# Patient Record
Sex: Female | Born: 1935 | Race: White | Hispanic: No | Marital: Single | Smoking: Never smoker
Health system: Southern US, Community
[De-identification: ages and names within clinical notes are randomized; demographics above are authoritative.]

## PROBLEM LIST (undated history)

## (undated) DIAGNOSIS — F200 Paranoid schizophrenia: Secondary | ICD-10-CM

## (undated) DIAGNOSIS — I6529 Occlusion and stenosis of unspecified carotid artery: Secondary | ICD-10-CM

## (undated) DIAGNOSIS — I10 Essential (primary) hypertension: Secondary | ICD-10-CM

## (undated) DIAGNOSIS — C4492 Squamous cell carcinoma of skin, unspecified: Secondary | ICD-10-CM

## (undated) DIAGNOSIS — M810 Age-related osteoporosis without current pathological fracture: Secondary | ICD-10-CM

## (undated) DIAGNOSIS — E785 Hyperlipidemia, unspecified: Secondary | ICD-10-CM

## (undated) DIAGNOSIS — N189 Chronic kidney disease, unspecified: Secondary | ICD-10-CM

## (undated) HISTORY — PX: BREAST BIOPSY: SHX20

## (undated) HISTORY — DX: Chronic kidney disease, unspecified: N18.9

## (undated) HISTORY — DX: Hyperlipidemia, unspecified: E78.5

## (undated) HISTORY — DX: Squamous cell carcinoma of skin, unspecified: C44.92

## (undated) HISTORY — DX: Occlusion and stenosis of unspecified carotid artery: I65.29

## (undated) HISTORY — PX: CHOLECYSTECTOMY: SHX55

## (undated) HISTORY — DX: Age-related osteoporosis without current pathological fracture: M81.0

## (undated) HISTORY — DX: Essential (primary) hypertension: I10

## (undated) HISTORY — DX: Paranoid schizophrenia: F20.0

## (undated) HISTORY — PX: LASER ABLATION: SHX1947

## (undated) HISTORY — PX: CATARACT EXTRACTION: SUR2

## (undated) HISTORY — PX: PARTIAL HYSTERECTOMY: SHX80

## (undated) HISTORY — PX: TONSILLECTOMY: SUR1361

---

## 2004-07-22 ENCOUNTER — Ambulatory Visit: Payer: Self-pay | Admitting: Internal Medicine

## 2004-08-09 ENCOUNTER — Ambulatory Visit: Payer: Self-pay | Admitting: Internal Medicine

## 2004-09-06 ENCOUNTER — Ambulatory Visit: Payer: Self-pay | Admitting: Internal Medicine

## 2004-10-22 ENCOUNTER — Ambulatory Visit: Payer: Self-pay | Admitting: Family Medicine

## 2004-12-20 ENCOUNTER — Ambulatory Visit: Payer: Self-pay | Admitting: Internal Medicine

## 2004-12-29 ENCOUNTER — Ambulatory Visit: Payer: Self-pay | Admitting: Internal Medicine

## 2005-01-12 ENCOUNTER — Encounter: Admission: RE | Admit: 2005-01-12 | Discharge: 2005-01-12 | Payer: Self-pay | Admitting: Internal Medicine

## 2005-03-18 ENCOUNTER — Ambulatory Visit: Payer: Self-pay | Admitting: Internal Medicine

## 2005-03-21 ENCOUNTER — Ambulatory Visit: Payer: Self-pay | Admitting: Internal Medicine

## 2005-03-28 ENCOUNTER — Ambulatory Visit: Payer: Self-pay | Admitting: Internal Medicine

## 2005-04-15 ENCOUNTER — Ambulatory Visit: Payer: Self-pay | Admitting: Internal Medicine

## 2005-08-22 ENCOUNTER — Ambulatory Visit: Payer: Self-pay | Admitting: Internal Medicine

## 2005-09-07 ENCOUNTER — Ambulatory Visit: Payer: Self-pay | Admitting: Internal Medicine

## 2006-01-20 ENCOUNTER — Ambulatory Visit: Payer: Self-pay | Admitting: Internal Medicine

## 2006-03-03 ENCOUNTER — Ambulatory Visit: Payer: Self-pay | Admitting: Internal Medicine

## 2006-04-11 ENCOUNTER — Ambulatory Visit: Payer: Self-pay | Admitting: Internal Medicine

## 2006-04-20 ENCOUNTER — Ambulatory Visit: Payer: Self-pay | Admitting: Internal Medicine

## 2006-04-20 LAB — CONVERTED CEMR LAB
ALT: 19 units/L (ref 0–40)
AST: 25 units/L (ref 0–37)
CO2: 27 meq/L (ref 19–32)
Chloride: 105 meq/L (ref 96–112)
Chol/HDL Ratio, serum: 3.9
Cholesterol: 233 mg/dL (ref 0–200)
Glucose, Bld: 93 mg/dL (ref 70–99)
LDL DIRECT: 148.3 mg/dL
Potassium: 3.9 meq/L (ref 3.5–5.1)
Sodium: 140 meq/L (ref 135–145)

## 2006-05-11 ENCOUNTER — Encounter: Payer: Self-pay | Admitting: Family Medicine

## 2006-05-11 ENCOUNTER — Other Ambulatory Visit: Admission: RE | Admit: 2006-05-11 | Discharge: 2006-05-11 | Payer: Self-pay | Admitting: Family Medicine

## 2006-05-11 ENCOUNTER — Ambulatory Visit: Payer: Self-pay | Admitting: Family Medicine

## 2006-09-29 ENCOUNTER — Ambulatory Visit: Payer: Self-pay | Admitting: Family Medicine

## 2006-10-16 DIAGNOSIS — I10 Essential (primary) hypertension: Secondary | ICD-10-CM | POA: Insufficient documentation

## 2006-10-16 DIAGNOSIS — E785 Hyperlipidemia, unspecified: Secondary | ICD-10-CM

## 2006-10-16 DIAGNOSIS — Z8659 Personal history of other mental and behavioral disorders: Secondary | ICD-10-CM

## 2006-10-25 ENCOUNTER — Ambulatory Visit: Payer: Self-pay | Admitting: Internal Medicine

## 2006-11-30 ENCOUNTER — Emergency Department (HOSPITAL_COMMUNITY): Admission: EM | Admit: 2006-11-30 | Discharge: 2006-11-30 | Payer: Self-pay | Admitting: Emergency Medicine

## 2006-12-01 ENCOUNTER — Telehealth: Payer: Self-pay | Admitting: Internal Medicine

## 2006-12-01 ENCOUNTER — Ambulatory Visit: Payer: Self-pay | Admitting: Internal Medicine

## 2006-12-04 ENCOUNTER — Encounter: Payer: Self-pay | Admitting: Internal Medicine

## 2006-12-06 ENCOUNTER — Ambulatory Visit: Payer: Self-pay | Admitting: Internal Medicine

## 2006-12-07 LAB — CONVERTED CEMR LAB: TSH: 1.97 microintl units/mL (ref 0.35–5.50)

## 2007-01-12 ENCOUNTER — Ambulatory Visit: Payer: Self-pay | Admitting: Internal Medicine

## 2007-01-29 ENCOUNTER — Telehealth: Payer: Self-pay | Admitting: Internal Medicine

## 2007-02-13 ENCOUNTER — Other Ambulatory Visit: Payer: Self-pay | Admitting: Emergency Medicine

## 2007-02-13 ENCOUNTER — Ambulatory Visit: Payer: Self-pay | Admitting: Psychiatry

## 2007-02-13 ENCOUNTER — Inpatient Hospital Stay (HOSPITAL_COMMUNITY): Admission: AD | Admit: 2007-02-13 | Discharge: 2007-02-21 | Payer: Self-pay | Admitting: Psychiatry

## 2007-05-01 ENCOUNTER — Ambulatory Visit: Payer: Self-pay | Admitting: Internal Medicine

## 2007-08-02 ENCOUNTER — Ambulatory Visit: Payer: Self-pay | Admitting: Internal Medicine

## 2007-08-02 LAB — CONVERTED CEMR LAB
Glucose, Urine, Semiquant: NEGATIVE
Ketones, urine, test strip: NEGATIVE
Protein, U semiquant: NEGATIVE
RBC / HPF: NONE SEEN (ref <3)
Urobilinogen, UA: NEGATIVE

## 2007-08-07 ENCOUNTER — Telehealth (INDEPENDENT_AMBULATORY_CARE_PROVIDER_SITE_OTHER): Payer: Self-pay | Admitting: *Deleted

## 2007-08-07 LAB — CONVERTED CEMR LAB
BUN: 13 mg/dL (ref 6–23)
Basophils Relative: 0.7 % (ref 0.0–1.0)
Chloride: 105 meq/L (ref 96–112)
Cholesterol: 280 mg/dL (ref 0–200)
Creatinine, Ser: 0.9 mg/dL (ref 0.4–1.2)
Direct LDL: 185.5 mg/dL
Eosinophils Absolute: 0.3 10*3/uL (ref 0.0–0.6)
GFR calc Af Amer: 78 mL/min
HCT: 41.6 % (ref 36.0–46.0)
HDL: 64.5 mg/dL (ref >39.0)
Monocytes Absolute: 0.4 10*3/uL (ref 0.2–0.7)
Potassium: 3.9 meq/L (ref 3.5–5.1)
Sodium: 141 meq/L (ref 135–145)
Total CHOL/HDL Ratio: 4.3
WBC: 4.9 10*3/uL (ref 4.5–10.5)

## 2007-08-08 ENCOUNTER — Encounter (INDEPENDENT_AMBULATORY_CARE_PROVIDER_SITE_OTHER): Payer: Self-pay | Admitting: *Deleted

## 2007-08-08 ENCOUNTER — Telehealth (INDEPENDENT_AMBULATORY_CARE_PROVIDER_SITE_OTHER): Payer: Self-pay | Admitting: *Deleted

## 2007-08-10 ENCOUNTER — Encounter: Payer: Self-pay | Admitting: Internal Medicine

## 2007-08-16 ENCOUNTER — Ambulatory Visit: Payer: Self-pay | Admitting: Internal Medicine

## 2007-08-20 ENCOUNTER — Encounter: Admission: RE | Admit: 2007-08-20 | Discharge: 2007-08-20 | Payer: Self-pay | Admitting: Internal Medicine

## 2007-08-30 ENCOUNTER — Encounter (INDEPENDENT_AMBULATORY_CARE_PROVIDER_SITE_OTHER): Payer: Self-pay | Admitting: *Deleted

## 2007-08-31 ENCOUNTER — Encounter: Payer: Self-pay | Admitting: Internal Medicine

## 2007-09-18 ENCOUNTER — Ambulatory Visit: Payer: Self-pay | Admitting: Internal Medicine

## 2007-09-18 LAB — CONVERTED CEMR LAB
Bacteria, UA: NONE SEEN
Bilirubin Urine: NEGATIVE
Glucose, Urine, Semiquant: NEGATIVE
Ketones, urine, test strip: NEGATIVE
Nitrite: NEGATIVE
Protein, U semiquant: NEGATIVE
Urobilinogen, UA: 0.2

## 2007-09-19 ENCOUNTER — Encounter: Payer: Self-pay | Admitting: Internal Medicine

## 2007-09-20 ENCOUNTER — Telehealth (INDEPENDENT_AMBULATORY_CARE_PROVIDER_SITE_OTHER): Payer: Self-pay | Admitting: *Deleted

## 2007-09-21 ENCOUNTER — Ambulatory Visit: Payer: Self-pay | Admitting: Internal Medicine

## 2007-09-21 ENCOUNTER — Telehealth (INDEPENDENT_AMBULATORY_CARE_PROVIDER_SITE_OTHER): Payer: Self-pay | Admitting: *Deleted

## 2007-10-02 ENCOUNTER — Ambulatory Visit: Payer: Self-pay | Admitting: Internal Medicine

## 2007-10-03 ENCOUNTER — Telehealth (INDEPENDENT_AMBULATORY_CARE_PROVIDER_SITE_OTHER): Payer: Self-pay | Admitting: *Deleted

## 2007-10-25 ENCOUNTER — Ambulatory Visit: Payer: Self-pay | Admitting: Internal Medicine

## 2007-10-25 LAB — CONVERTED CEMR LAB
Blood in Urine, dipstick: NEGATIVE
Nitrite: NEGATIVE
Urobilinogen, UA: 0.2
WBC Urine, dipstick: NEGATIVE
pH: 5

## 2007-10-26 ENCOUNTER — Encounter: Payer: Self-pay | Admitting: Internal Medicine

## 2007-10-30 ENCOUNTER — Telehealth (INDEPENDENT_AMBULATORY_CARE_PROVIDER_SITE_OTHER): Payer: Self-pay | Admitting: *Deleted

## 2007-11-06 ENCOUNTER — Ambulatory Visit: Payer: Self-pay | Admitting: Internal Medicine

## 2007-11-07 ENCOUNTER — Encounter (INDEPENDENT_AMBULATORY_CARE_PROVIDER_SITE_OTHER): Payer: Self-pay | Admitting: *Deleted

## 2007-11-12 LAB — CONVERTED CEMR LAB
Cholesterol: 222 mg/dL (ref 0–200)
Direct LDL: 137.3 mg/dL
HDL: 52.7 mg/dL (ref >39.0)
Total CHOL/HDL Ratio: 4.2
Triglycerides: 168 mg/dL — ABNORMAL HIGH (ref 0–149)
VLDL: 34 mg/dL (ref 0–40)

## 2007-11-14 ENCOUNTER — Encounter: Payer: Self-pay | Admitting: Internal Medicine

## 2008-02-18 ENCOUNTER — Ambulatory Visit: Payer: Self-pay | Admitting: Internal Medicine

## 2008-02-18 LAB — CONVERTED CEMR LAB
Glucose, Urine, Semiquant: NEGATIVE
Ketones, urine, test strip: NEGATIVE
Protein, U semiquant: NEGATIVE
Specific Gravity, Urine: 1.015

## 2008-02-19 ENCOUNTER — Encounter: Payer: Self-pay | Admitting: Internal Medicine

## 2008-02-19 LAB — CONVERTED CEMR LAB

## 2008-03-04 ENCOUNTER — Ambulatory Visit: Payer: Self-pay | Admitting: Internal Medicine

## 2008-03-04 LAB — CONVERTED CEMR LAB
Glucose, Urine, Semiquant: NEGATIVE
Nitrite: NEGATIVE

## 2008-03-11 ENCOUNTER — Telehealth: Payer: Self-pay | Admitting: Internal Medicine

## 2008-03-14 ENCOUNTER — Encounter (INDEPENDENT_AMBULATORY_CARE_PROVIDER_SITE_OTHER): Payer: Self-pay | Admitting: *Deleted

## 2008-04-15 ENCOUNTER — Telehealth: Payer: Self-pay | Admitting: Internal Medicine

## 2008-05-15 ENCOUNTER — Telehealth: Payer: Self-pay | Admitting: Internal Medicine

## 2008-07-22 ENCOUNTER — Ambulatory Visit: Payer: Self-pay | Admitting: Internal Medicine

## 2008-07-23 ENCOUNTER — Telehealth: Payer: Self-pay | Admitting: Internal Medicine

## 2008-08-18 ENCOUNTER — Telehealth (INDEPENDENT_AMBULATORY_CARE_PROVIDER_SITE_OTHER): Payer: Self-pay | Admitting: *Deleted

## 2008-08-26 ENCOUNTER — Encounter (INDEPENDENT_AMBULATORY_CARE_PROVIDER_SITE_OTHER): Payer: Self-pay | Admitting: *Deleted

## 2008-08-26 ENCOUNTER — Ambulatory Visit: Payer: Self-pay | Admitting: Internal Medicine

## 2008-08-26 DIAGNOSIS — M810 Age-related osteoporosis without current pathological fracture: Secondary | ICD-10-CM | POA: Insufficient documentation

## 2008-08-28 ENCOUNTER — Ambulatory Visit: Payer: Self-pay | Admitting: Internal Medicine

## 2008-08-31 LAB — CONVERTED CEMR LAB
BUN: 16 mg/dL (ref 6–23)
Calcium: 9 mg/dL (ref 8.4–10.5)
Direct LDL: 135.6 mg/dL
GFR calc Af Amer: 78 mL/min
HDL: 65 mg/dL (ref >39.0)
Triglycerides: 92 mg/dL (ref 0–149)
VLDL: 18 mg/dL (ref 0–40)
Vit D, 25-Hydroxy: 20 ng/mL — ABNORMAL LOW (ref 30–89)

## 2008-09-01 ENCOUNTER — Encounter (INDEPENDENT_AMBULATORY_CARE_PROVIDER_SITE_OTHER): Payer: Self-pay | Admitting: *Deleted

## 2008-09-02 ENCOUNTER — Telehealth (INDEPENDENT_AMBULATORY_CARE_PROVIDER_SITE_OTHER): Payer: Self-pay | Admitting: *Deleted

## 2008-09-03 ENCOUNTER — Encounter: Admission: RE | Admit: 2008-09-03 | Discharge: 2008-09-03 | Payer: Self-pay | Admitting: Internal Medicine

## 2008-09-05 ENCOUNTER — Ambulatory Visit: Payer: Self-pay | Admitting: Internal Medicine

## 2008-09-05 ENCOUNTER — Encounter: Payer: Self-pay | Admitting: Internal Medicine

## 2008-09-08 ENCOUNTER — Ambulatory Visit: Payer: Self-pay | Admitting: Internal Medicine

## 2008-09-11 ENCOUNTER — Encounter (INDEPENDENT_AMBULATORY_CARE_PROVIDER_SITE_OTHER): Payer: Self-pay | Admitting: *Deleted

## 2008-09-11 LAB — CONVERTED CEMR LAB: Fecal Occult Bld: NEGATIVE

## 2008-10-08 ENCOUNTER — Encounter (INDEPENDENT_AMBULATORY_CARE_PROVIDER_SITE_OTHER): Payer: Self-pay | Admitting: *Deleted

## 2008-10-17 ENCOUNTER — Ambulatory Visit: Payer: Self-pay | Admitting: Internal Medicine

## 2008-10-17 LAB — CONVERTED CEMR LAB
Alkaline Phosphatase: 51 units/L (ref 39–117)
Calcium: 9.3 mg/dL (ref 8.4–10.5)
Creatinine, Ser: 0.9 mg/dL (ref 0.4–1.2)
Magnesium: 2.3 mg/dL (ref 1.5–2.5)
Phosphorus: 4.3 mg/dL (ref 2.3–4.6)

## 2008-10-21 ENCOUNTER — Encounter: Payer: Self-pay | Admitting: Internal Medicine

## 2008-10-28 ENCOUNTER — Encounter: Payer: Self-pay | Admitting: Internal Medicine

## 2008-10-28 ENCOUNTER — Ambulatory Visit (HOSPITAL_COMMUNITY): Admission: RE | Admit: 2008-10-28 | Discharge: 2008-10-28 | Payer: Self-pay | Admitting: Internal Medicine

## 2008-12-01 ENCOUNTER — Ambulatory Visit: Payer: Self-pay | Admitting: Internal Medicine

## 2008-12-02 ENCOUNTER — Telehealth (INDEPENDENT_AMBULATORY_CARE_PROVIDER_SITE_OTHER): Payer: Self-pay | Admitting: *Deleted

## 2009-05-29 ENCOUNTER — Ambulatory Visit: Payer: Self-pay | Admitting: Internal Medicine

## 2009-05-29 DIAGNOSIS — C44791 Other specified malignant neoplasm of skin of unspecified lower limb, including hip: Secondary | ICD-10-CM

## 2009-06-08 ENCOUNTER — Telehealth: Payer: Self-pay | Admitting: Internal Medicine

## 2009-06-29 ENCOUNTER — Telehealth (INDEPENDENT_AMBULATORY_CARE_PROVIDER_SITE_OTHER): Payer: Self-pay | Admitting: *Deleted

## 2009-06-30 ENCOUNTER — Ambulatory Visit: Payer: Self-pay | Admitting: Internal Medicine

## 2009-06-30 DIAGNOSIS — F209 Schizophrenia, unspecified: Secondary | ICD-10-CM | POA: Insufficient documentation

## 2009-09-04 ENCOUNTER — Encounter: Admission: RE | Admit: 2009-09-04 | Discharge: 2009-09-04 | Payer: Self-pay | Admitting: Internal Medicine

## 2009-09-15 ENCOUNTER — Encounter: Payer: Self-pay | Admitting: Internal Medicine

## 2009-09-16 ENCOUNTER — Encounter: Admission: RE | Admit: 2009-09-16 | Discharge: 2009-09-16 | Payer: Self-pay | Admitting: Internal Medicine

## 2009-09-24 ENCOUNTER — Ambulatory Visit: Payer: Self-pay | Admitting: Internal Medicine

## 2009-09-24 LAB — CONVERTED CEMR LAB: Vit D, 25-Hydroxy: 31 ng/mL (ref 30–89)

## 2009-10-01 LAB — CONVERTED CEMR LAB
BUN: 15 mg/dL (ref 6–23)
Basophils Relative: 0.7 % (ref 0.0–3.0)
Eosinophils Absolute: 0.1 10*3/uL (ref 0.0–0.7)
Eosinophils Relative: 1.5 % (ref 0.0–5.0)
GFR calc non Af Amer: 64.05 mL/min (ref >60)
HDL: 78.4 mg/dL (ref >39.00)
Hemoglobin: 14.4 g/dL (ref 12.0–15.0)
Lymphocytes Relative: 24.6 % (ref 12.0–46.0)
Lymphs Abs: 1.5 10*3/uL (ref 0.7–4.0)
MCHC: 33.9 g/dL (ref 30.0–36.0)
Monocytes Absolute: 0.4 10*3/uL (ref 0.1–1.0)
Monocytes Relative: 5.9 % (ref 3.0–12.0)
Neutro Abs: 4.1 10*3/uL (ref 1.4–7.7)
Platelets: 308 10*3/uL (ref 150.0–400.0)
RBC: 4.41 M/uL (ref 3.87–5.11)
Total CHOL/HDL Ratio: 3

## 2009-11-26 ENCOUNTER — Telehealth: Payer: Self-pay | Admitting: Internal Medicine

## 2010-01-12 ENCOUNTER — Ambulatory Visit: Payer: Self-pay | Admitting: Internal Medicine

## 2010-01-12 DIAGNOSIS — M1990 Unspecified osteoarthritis, unspecified site: Secondary | ICD-10-CM | POA: Insufficient documentation

## 2010-01-13 ENCOUNTER — Encounter: Payer: Self-pay | Admitting: Internal Medicine

## 2010-01-18 ENCOUNTER — Encounter: Payer: Self-pay | Admitting: Internal Medicine

## 2010-02-01 ENCOUNTER — Telehealth (INDEPENDENT_AMBULATORY_CARE_PROVIDER_SITE_OTHER): Payer: Self-pay | Admitting: *Deleted

## 2010-02-04 ENCOUNTER — Ambulatory Visit: Payer: Self-pay | Admitting: Internal Medicine

## 2010-02-09 ENCOUNTER — Encounter: Payer: Self-pay | Admitting: Internal Medicine

## 2010-02-09 LAB — CONVERTED CEMR LAB
Potassium: 4.2 meq/L (ref 3.5–5.1)
Sodium: 143 meq/L (ref 135–145)

## 2010-02-16 ENCOUNTER — Telehealth: Payer: Self-pay | Admitting: Internal Medicine

## 2010-02-17 ENCOUNTER — Encounter: Payer: Self-pay | Admitting: Internal Medicine

## 2010-02-17 ENCOUNTER — Ambulatory Visit: Payer: Self-pay | Admitting: Internal Medicine

## 2010-02-17 DIAGNOSIS — R142 Eructation: Secondary | ICD-10-CM

## 2010-02-17 DIAGNOSIS — R143 Flatulence: Secondary | ICD-10-CM

## 2010-02-17 DIAGNOSIS — R079 Chest pain, unspecified: Secondary | ICD-10-CM

## 2010-02-17 DIAGNOSIS — R141 Gas pain: Secondary | ICD-10-CM | POA: Insufficient documentation

## 2010-02-19 LAB — CONVERTED CEMR LAB
Basophils Absolute: 0 10*3/uL (ref 0.0–0.1)
Eosinophils Relative: 3.8 % (ref 0.0–5.0)
HCT: 39.1 % (ref 36.0–46.0)
Hemoglobin: 13.6 g/dL (ref 12.0–15.0)
Lymphocytes Relative: 33 % (ref 12.0–46.0)
Lymphs Abs: 1.9 10*3/uL (ref 0.7–4.0)
MCHC: 34.7 g/dL (ref 30.0–36.0)
Monocytes Absolute: 0.5 10*3/uL (ref 0.1–1.0)
Neutrophils Relative %: 54.3 % (ref 43.0–77.0)
RDW: 12.9 % (ref 11.5–14.6)
Total CK: 83 units/L (ref 7–177)
WBC: 5.6 10*3/uL (ref 4.5–10.5)

## 2010-03-12 ENCOUNTER — Telehealth (INDEPENDENT_AMBULATORY_CARE_PROVIDER_SITE_OTHER): Payer: Self-pay | Admitting: *Deleted

## 2010-03-25 ENCOUNTER — Ambulatory Visit: Payer: Self-pay | Admitting: Internal Medicine

## 2010-03-29 LAB — CONVERTED CEMR LAB
Chloride: 108 meq/L (ref 96–112)
Glucose, Bld: 99 mg/dL (ref 70–99)
Potassium: 4.3 meq/L (ref 3.5–5.1)

## 2010-03-30 ENCOUNTER — Encounter: Payer: Self-pay | Admitting: Internal Medicine

## 2010-03-30 ENCOUNTER — Telehealth (INDEPENDENT_AMBULATORY_CARE_PROVIDER_SITE_OTHER): Payer: Self-pay | Admitting: *Deleted

## 2010-04-09 ENCOUNTER — Ambulatory Visit (HOSPITAL_COMMUNITY): Admission: RE | Admit: 2010-04-09 | Discharge: 2010-04-09 | Payer: Self-pay | Admitting: Internal Medicine

## 2010-04-09 ENCOUNTER — Encounter: Payer: Self-pay | Admitting: Internal Medicine

## 2010-05-05 ENCOUNTER — Ambulatory Visit: Payer: Self-pay | Admitting: Internal Medicine

## 2010-05-18 ENCOUNTER — Telehealth: Payer: Self-pay | Admitting: Internal Medicine

## 2010-07-18 ENCOUNTER — Encounter: Payer: Self-pay | Admitting: Internal Medicine

## 2010-07-27 NOTE — Medication Information (Signed)
Summary: Enrollment Confirmation / Reclast  Enrollment Confirmation / Reclast   Imported By: Lennie Odor 01/22/2010 09:49:48  _____________________________________________________________________  External Attachment:    Type:   Image     Comment:   External Document

## 2010-07-27 NOTE — Progress Notes (Signed)
Summary: Labs / Reclast  Phone Note From Other Clinic   Caller: [facility] Summary of Call: [facility] called and states that her labs are now too old so she has to have labs drawn again and then I will call and set up an appt for her Reclast. Lab appt scheduled. Army Fossa CMA  March 12, 2010 2:50 PM
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
## 2010-07-27 NOTE — Progress Notes (Signed)
Summary: Reclast orders  Phone Note Other Incoming   Caller[facility] [phone] Summary of Call: [facility] called stating that the patient needs Reclast infusion. Please send new order and recent labs to fax [phone].  **Completed, no further action. Initial call taken by: Lucious Groves CMA,  March 30, 2010 2:34 PM
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
## 2010-07-27 NOTE — Medication Information (Signed)
Summary: Patient Assistance Form/Reclast  Patient Assistance Form/Reclast   Imported By: Lanelle Bal 02/09/2010 09:15:37  _____________________________________________________________________  External Attachment:    Type:   Image     Comment:   External Document
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
## 2010-07-27 NOTE — Progress Notes (Signed)
Summary: Typhoid fumes  Phone Note Call from Patient Call back at Home Phone [phone]   Summary of Call: Patient left message on triage that typhoid fever poison is being pumped into her home that originated in [place]. Patient noted fumes on triage voicemail. She also notes that she will be getting a Clinical research associate. Please advise. Initial call taken by: Lucious Groves CMA,  May 18, 2010 9:32 AM  Follow-up for Phone Call        patient has schizophrenia, please tell her to try to avoid fumes Jose E. Paz MD  May 18, 2010 4:38 PM   Additional Follow-up for Phone Call Additional follow up Details #1::        Left message on machine to call back to office. Lucious Groves CMA  May 18, 2010 4:43 PM   Patient notified. Lucious Groves CMA  May 19, 2010 9:34 AM
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
## 2010-07-27 NOTE — Op Note (Signed)
Summary: Reclast Orders/[facility]  Reclast [facility]   Imported By: Lanelle Bal 02/18/2010 12:46:00  _____________________________________________________________________  External Attachment:    Type:   Image     Comment:   External Document

## 2010-07-27 NOTE — Op Note (Signed)
Summary: Reclast Infusion/[facility]  Reclast Infusion/[facility]   Imported By: Lanelle Bal 04/21/2010 09:19:04  _____________________________________________________________________  External Attachment:    Type:   Image     Comment:   External Document  Appended Document: Reclast Infusion/[facility] next in one year

## 2010-07-27 NOTE — Progress Notes (Signed)
Summary: Reclast  Phone Note Call from Patient Call back at Home Phone [phone]   Caller: Patient Call For: Lowell E. Paz MD Summary of Call: Patient is requesting her reclast to be scheduled Initial call taken by: Barnie Mort,  February 01, 2010 3:28 PM  Follow-up for Phone Call        Spoke with pt spoke with her about her benefits. She is going to come in for the bloodwork and we will fax over results to Short Stay and then schedule her infusion. Will send Benefits to be scanned in. Army Fossa CMA  February 02, 2010 8:51 AM

## 2010-07-27 NOTE — Assessment & Plan Note (Signed)
Summary: FOLLOW UP//PH   Vital Signs:  Patient profile:   77 year old female Height:      64.75 inches Weight:      153 pounds BMI:     25.75 Pulse rate:   79 / minute BP sitting:   140 / 82 CC: 4 month followup, pt is fasting   History of Present Illness: Hyperlipidemia--due for labs, fasting HTN-- on no meds   SCHIZOPHRENIA -- "I still get poisons"  Osteoporosis--had reclast  10/2008, history of low vitamin D, status-post ergocalciferol, due for  labs last MMG was  abnormal, needed a  L MMG which was ok , was rec to go back to routine yearly MMGs   Allergies: 1)  ! Pcn  Past History:  Past Medical History: Hyperlipidemia HTN  SCHIZOPHRENIA  SCC in situ R pretibial area Osteoporosis  Past Surgical History: Reviewed history from 08/26/2008 and no changes required. Cataract extraction Cholecystectomy Hysterectomy-PARTIAL due to bleeding Tonsillectomy BENIGN BREAST TUMOR  Social History: Reviewed history from 08/02/2007 and no changes required. lives by self still drives widow Brother [name] in [place], lives 5 min away  Review of Systems CV:  Denies chest pain or discomfort, palpitations, and swelling of feet. GI:  Denies bloody stools and vomiting; occasionally nausea .  Physical Exam  General:  alert and well-developed.   Breasts:  No mass, nodules, thickening, tenderness, bulging, retraction, inflamation, nipple discharge or skin changes noted.   Lungs:  normal respiratory effort, no intercostal retractions, no accessory muscle use, and normal breath sounds.   Heart:  normal rate, regular rhythm, and no murmur.   Extremities:  no pretibial edema bilaterally  Psych:  not anxious appearing and not depressed appearing.     Impression & Recommendations:  Problem # 1:  OSTEOPOROSIS (ICD-733.00) last DEXA 3-10,showed osteoporosis reclast done 5-10 s/p ergocalciferol  labs  Her updated medication list for this problem includes:    Oscal 500/200 D-3  Tabs (Calcium-vitamin d tabs)  Orders: TLB-TSH (Thyroid Stimulating Hormone) (84443-TSH) T-Vitamin D (25-Hydroxy) (16109-60454)  Problem # 2:  HEALTH SCREENING (ICD-V70.0) recently MMGs reviewed breast exam today normal  Problem # 3:  HYPERTENSION (ICD-401.9) on no meds  Orders: TLB-CBC Platelet - w/Differential (85025-CBCD)  BP today: 140/82 Prior BP: 120/80 (06/30/2009)  Labs Reviewed: K+: 3.9 (08/28/2008) Creat: : 0.9 (10/17/2008)   Chol: 222 (08/28/2008)   HDL: 65.0 (08/28/2008)   LDL: DEL (08/28/2008)   TG: 92 (08/28/2008)  Problem # 4:  HYPERLIPIDEMIA (ICD-272.4) due for labs  Her updated medication list for this problem includes:    Zetia 10 Mg Tabs (Ezetimibe) .Marland Kitchen... 1 by mouth once daily  Orders: Venipuncture (09811) TLB-BMP (Basic Metabolic Panel-BMET) (80048-METABOL) TLB-Lipid Panel (80061-LIPID)  Labs Reviewed: SGOT: 24 (08/28/2008)   SGPT: 24 (08/28/2008)   HDL:65.0 (08/28/2008), 52.7 (11/06/2007)  LDL:DEL (08/28/2008), DEL (11/06/2007)  Chol:222 (08/28/2008), 222 (11/06/2007)  Trig:92 (08/28/2008), 168 (11/06/2007)  Problem # 5:  SCHIZOPHRENIA (ICD-295.90) not taking meds, stable   Complete Medication List: 1)  Daily Multiple Vitamins Tabs (Multiple vitamin) 2)  Fish Oil 1000 Mg Caps (Omega-3 fatty acids) 3)  Oscal 500/200 D-3 Tabs (Calcium-vitamin d tabs) 4)  Zetia 10 Mg Tabs (Ezetimibe) .Marland Kitchen.. 1 by mouth once daily 5)  Restasis 0.05 % Emul (Cyclosporine)  Patient Instructions: 1)  Please schedule a follow-up appointment in 6 months .

## 2010-07-27 NOTE — Op Note (Signed)
Summary: Infusion Orders/Reclast  Infusion Orders/Reclast   Imported By: Lanelle Bal 04/07/2010 11:19:53  _____________________________________________________________________  External Attachment:    Type:   Image     Comment:   External Document
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
## 2010-10-23 ENCOUNTER — Emergency Department (HOSPITAL_COMMUNITY)
Admission: EM | Admit: 2010-10-23 | Discharge: 2010-10-23 | Disposition: A | Discharge status: Other Acute Inpatient Hospital | Payer: Medicare Other | Attending: Emergency Medicine | Admitting: Emergency Medicine

## 2010-10-23 DIAGNOSIS — I10 Essential (primary) hypertension: Secondary | ICD-10-CM | POA: Insufficient documentation

## 2010-10-23 DIAGNOSIS — F29 Unspecified psychosis not due to a substance or known physiological condition: Secondary | ICD-10-CM

## 2010-10-23 DIAGNOSIS — IMO0002 Reserved for concepts with insufficient information to code with codable children: Secondary | ICD-10-CM | POA: Insufficient documentation

## 2010-10-23 LAB — TSH: TSH: 2.617 u[IU]/mL (ref 0.350–4.500)

## 2010-10-23 LAB — DIFFERENTIAL
Eosinophils Absolute: 0.3 10*3/uL (ref 0.0–0.7)
Eosinophils Relative: 5 % (ref 0–5)
Lymphs Abs: 2.6 10*3/uL (ref 0.7–4.0)
Monocytes Relative: 9 % (ref 3–12)

## 2010-10-23 LAB — RAPID URINE DRUG SCREEN, HOSP PERFORMED
Amphetamines: NOT DETECTED
Benzodiazepines: NOT DETECTED
Cocaine: NOT DETECTED
Tetrahydrocannabinol: NOT DETECTED

## 2010-10-23 LAB — COMPREHENSIVE METABOLIC PANEL
ALT: 21 U/L (ref 0–35)
Alkaline Phosphatase: 50 U/L (ref 39–117)
BUN: 18 mg/dL (ref 6–23)
CO2: 26 mEq/L (ref 19–32)
GFR calc non Af Amer: 53 mL/min — ABNORMAL LOW (ref >60)
Glucose, Bld: 106 mg/dL — ABNORMAL HIGH (ref 70–99)
Potassium: 3.5 mEq/L (ref 3.5–5.1)
Sodium: 140 mEq/L (ref 135–145)

## 2010-10-23 LAB — URINALYSIS, ROUTINE W REFLEX MICROSCOPIC
Bilirubin Urine: NEGATIVE
Ketones, ur: NEGATIVE mg/dL
Nitrite: NEGATIVE
Protein, ur: NEGATIVE mg/dL
Urobilinogen, UA: 0.2 mg/dL (ref 0.0–1.0)

## 2010-10-23 LAB — CBC
HCT: 40.6 % (ref 36.0–46.0)
Hemoglobin: 13.4 g/dL (ref 12.0–15.0)
MCH: 31.5 pg (ref 26.0–34.0)
MCHC: 33 g/dL (ref 30.0–36.0)
MCV: 95.5 fL (ref 78.0–100.0)
Platelets: 285 K/uL (ref 150–400)
RBC: 4.25 MIL/uL (ref 3.87–5.11)
RDW: 12.5 % (ref 11.5–15.5)
WBC: 6.5 K/uL (ref 4.0–10.5)

## 2010-10-23 LAB — ETHANOL: Alcohol, Ethyl (B): <5 mg/dL (ref 0–10)

## 2010-10-27 DIAGNOSIS — F29 Unspecified psychosis not due to a substance or known physiological condition: Secondary | ICD-10-CM

## 2010-11-04 ENCOUNTER — Other Ambulatory Visit: Payer: Self-pay | Admitting: Internal Medicine

## 2010-11-09 NOTE — H&P (Signed)
Mikayla Marks, Mikayla Marks NO.:  [id number]   MEDICAL RECORD NO.:  [id number]          PATIENT TYPE:  IPS   LOCATION:  0504                          FACILITY:  [facility]   PHYSICIAN:  Anselm Jungling, MD  DATE OF BIRTH:  Feb 27, 1928   DATE OF ADMISSION:  02/13/2007  DATE OF DISCHARGE:                       PSYCHIATRIC ADMISSION ASSESSMENT   IDENTIFYING INFORMATION:  This is a 77 year old white female.  This is  an involuntary admission.   HISTORY OF PRESENT ILLNESS:  This pleasant 77 year old was taken to the  emergency room by her family because of persistent beliefs that she is  at risk in her own home, smelling strange odors and believing that her  apartment is being contaminated by toxic fumes, possibly natural gas in  her house.  She believes that unspecified people are coming into her  house during the day and sometimes at night, taking jewelry and money  and then coming back later on and replacing it.  She believes that she  is being persecuted in her neighborhood because of her Catholic faith.  She has a history of paranoid delusions of a similar nature and has a  previous diagnosis of schizophrenia, paranoid-type.  She reports that  she has not been taking her Haldol 0.5 mg twice a day for several weeks  now for reasons that are not clear.  She admits that her sleep and  appetite have been disrupted because of her fears and concerns.  Her  family had been concerned about how poorly she was functioning, becoming  increasingly fearful.  Today, she is fully alert, talks quite clearly  about all of her fears and concerns, denying any hallucinations.   PAST PSYCHIATRIC HISTORY:  This is the patient's first inpatient  psychiatric admission.  She is followed as an outpatient by Dr. Jules Schick and Fransico Setters, RN at [facility].  She  reports a history of paranoia and persecutions since she lived in  [place] many years ago when she was younger.   She has no history of  suicide attempts.  She reports that her only medications that she has  taken is the Haldol that was just currently prescribed which she is not  taking.   SOCIAL HISTORY:  This is a widowed white female who lives alone in her  own townhouse, retired from working as a Architect.  She has been  active in [employer] groups for several years and most of her  social contacts and support involve her church community.  She has a  supportive brother who brought her to the hospital and other supportive  family.   FAMILY HISTORY:  Noncontributory.   MEDICAL HISTORY:  The patient is followed by Dr. Drue Novel, her primary care  physician in [place], [place].  Medical problems are none.  Past medical history has been remarkable for some transient elevated  blood pressure from time to time.  She is on no medications for it.  Also has a past history for some skin cancer on her face which is  currently resolved.   CURRENT MEDICATIONS:  Haldol 0.5 mg a couple  of times a day which she  is not taking.  Denies any other medications.   ALLERGIES:  PENICILLIN.   POSITIVE PHYSICAL FINDINGS:  Well-nourished, well-developed female  medically evaluated in the emergency room where she was pleasant and  cooperative.  Temperature 97.6, pulse 72, respirations 20, blood  pressure 148/75, pulse ox 96%.   LABORATORY DATA:  Diagnostic studies revealed CBC all within normal  limits.  Hemoglobin 14.0, hematocrit 40.9, platelets 266,000.  Urine  drug screen negative.  Alcohol level less than 5.  Electrolytes all  within normal limits.  BUN is 12, creatinine 0.93.  Random glucose is  114.  Urinalysis was remarkable for a specific gravity of 1.011 and no  leukocytes, ketones of 15 mg/dL.   On presentation to the unit, pleasant, white female, height 5 feet 4  inches tall, weight 148 pounds, temperature afebrile, pulse 87,  respirations 19.   MENTAL STATUS EXAM:  Fully alert  female, pleasant and cooperative with  an anxious affect.  Cognition is well-preserved.  Her grooming is good.  She is dressed appropriately.  Speech a constant stream of speech,  hyperverbal, and somewhat circumferential but eventually does get to the  point, forthcoming.  She has a highly systematized and detailed  delusional matrix with a long history and provides detailed and specific  description of all of her concerns.  Affect and mood are somewhat  anxious.  Cognition is well-preserved.  Thought process is paranoid,  delusional, talks about the various persecutions in her life.  No  clear suicidal or homicidal thought.   DIAGNOSES:  AXIS I:  Psychosis not otherwise specified.  Rule out  delusional disorder.  AXIS II:  Deferred.  AXIS III:  No diagnosis.  AXIS IV:  Moderate (stress with social disruption in her neighborhood,  possible construction triggering her anxiety, having supportive family  is an asset to her).  AXIS V:  Current 30; past year 26.   PLAN:  To involuntarily admit the patient with 15-minute checks in  place.  To alleviate her anxiety.  Improve her functioning and reality  testing.  We are going to start her back on her Haldol 0.5 mg b.i.d.  Watch her intake and output carefully and get her started in group  therapy which she has been very cooperative with peers and staff and we  will hear her family's concerns.  At this point, we are going to plan on  having her follow up with [facility], Dr. Jules Schick and Fransico Setters.   ESTIMATED LENGTH OF STAY:  Seven days.      Margaret A. Lorin Picket, N.P.      Anselm Jungling, MD  Electronically Signed    MAS/MEDQ  D:  02/14/2007  T:  02/15/2007  Job:  [id number]

## 2010-11-12 NOTE — Discharge Summary (Signed)
[name], [name] NO.:  [id number]   MEDICAL RECORD NO.:  [id number]          PATIENT TYPE:  IPS   LOCATION:  0504                          [facility]ACILITY:  [facility]   PHYSICIAN:  Anselm Jungling, MD  DATE OF BIRTH:  05-Feb-1928   DATE OF ADMISSION:  02/13/2007  DATE OF DISCHARGE:  02/21/2007                               DISCHARGE SUMMARY   IDENTIFYING DATA/REASON FOR ADMISSION:  This was an inpatient  psychiatric admission for Mikayla Marks, a 77 year old Caucasian female who  was admitted with increasingly florid paranoid delusions, involving  poison gases in her home and neighbors that would enter, steal from her  home then return the stolen items mysteriously.  She came to [place] with a  history of paranoid schizophrenia.  She had been living alone.  Please  refer to the admission note for further details pertaining to the  symptoms, circumstances and history that led to her hospitalization.   INITIAL DIAGNOSTIC IMPRESSION:  She was given an initial AXIS I  diagnosis of schizophrenia, paranoid-type, acute exacerbation.   MEDICAL/LABORATORY:  The patient was medically and physically assessed  by the psychiatric nurse practitioner.  She was in very good health for  her age without any active or chronic medical problems.   HOSPITAL COURSE:  The patient was admitted to the adult inpatient  psychiatric service.  She presented as a well-nourished, well-developed,  older woman, who looked younger than her 36 years.  She was always  meticulously dressed and groomed.  She was fully alert, and oriented.  She was quite articulate, logical in speech but related in detail and  well-organized fashion her delusional ideas.  Her mood appeared to be  neutral, with some appropriate and anxious affect.  The delusional  system that she presented was highly systematized, and she was resistant  to any suggestion that her perceptions about neighbors poisoning her was  not real.  However, she  did want help with her concerns, and was open to  treatment and was quite cooperative with medication.   She was treated with a regimen of Zyprexa to address her psychotic  symptoms.  These did not appear to be particularly effective, and  because of this she was changed to a regimen of Haldol, 2 mg q.h.s.  This was well-tolerated without any significant adverse side effects,  extrapyramidal or otherwise.  She was not sedated by Haldol in this  dosage regimen.   She was involved in the therapeutic milieu, and participated in various  therapeutic groups and activities geared towards helping her develop a  better understanding of her underlying disorder and dynamics, and the  development of an aftercare plan.  She was a good participant  throughout.   We were in contact with her family throughout her hospital stay.   Over the remainder of her nine-day hospital course, the patient  continued without much evident change.  Her delusional ideation did not  abate although she appeared to be less strongly invested and concerned  in it.  She indicated that she was more than willing to return to the  home wherein which she  felt she had been targeted with poisoning.  She  seemed to feel that she could handle that circumstance even if it  continued.   It was the judgment and perception of the undersigned that, although her  antipsychotic medication doses certainly could have been increased, that  it was not very likely to have much of an impact on this very fixed,  systematized delusional system that she had developed.  It was clear  that she was functioning quite well, cognitively sharp otherwise, and  able to care for herself, in terms of ADLs, as well as cooking,  housecleaning, duties, etc.  She had good support available from family.  She was clearly not a danger to herself or anyone else.  Because of  this, and because she felt ready to go home, she was discharged with  concurrence of  her family.   The patient did agree to receiving Haldol in Decanoate injection form,  and she received a 25 mg IM injection prior to discharge.   AFTERCARE:  The patient agreed to the following aftercare plan.  She was  to follow up with [facility], [name], with an  appointment on March 23, 2007.  She was placed on a late  cancellation list in hopes of a sooner appointment.   DISCHARGE MEDICATIONS:  1. Haldol Decanoate, 25 mg IM monthly, next injection due March 22, 2007.  2. Haldol 2 mg p.o. q.h.s. through Sunday, February 25, 2007.   DISCHARGE DIAGNOSES:  AXIS I:  Schizophrenia, paranoid-type versus  delusional disorder not otherwise specified.  AXIS II:  Deferred.  AXIS III:  No acute or chronic illnesses.  AXIS IV:  Stressors:  Severe.  AXIS V:  GAF on discharge 55.      Anselm Jungling, MD  Electronically Signed     SPB/MEDQ  D:  03/06/2007  T:  03/06/2007  Job:  [id number]

## 2010-11-25 ENCOUNTER — Other Ambulatory Visit: Payer: Self-pay | Admitting: Internal Medicine

## 2010-11-25 NOTE — Telephone Encounter (Signed)
Message left for patient to return my call.  Dr.Paz- this med was removed in 2009, should pt be taking, I have left a message for her.

## 2010-11-25 NOTE — Telephone Encounter (Signed)
Denied, I have never prescribed that medication to her ; needs to talk with her psychiatrist if needed

## 2010-11-25 NOTE — Telephone Encounter (Signed)
Duplicate

## 2010-11-26 ENCOUNTER — Other Ambulatory Visit: Payer: Self-pay | Admitting: Internal Medicine

## 2010-11-26 NOTE — Telephone Encounter (Signed)
Pt is aware.  

## 2010-11-26 NOTE — Telephone Encounter (Signed)
Does her Psych fill this?

## 2010-11-26 NOTE — Telephone Encounter (Signed)
Denied, not on her list; need to call psych

## 2010-11-29 ENCOUNTER — Other Ambulatory Visit: Payer: Self-pay | Admitting: Internal Medicine

## 2010-11-29 NOTE — Telephone Encounter (Signed)
Pt is aware her psych needs to fill this med.

## 2011-01-04 ENCOUNTER — Other Ambulatory Visit: Payer: Self-pay | Admitting: Internal Medicine

## 2011-01-17 ENCOUNTER — Other Ambulatory Visit: Payer: Self-pay | Admitting: Internal Medicine

## 2011-02-07 ENCOUNTER — Other Ambulatory Visit: Payer: Self-pay | Admitting: Internal Medicine

## 2011-02-07 NOTE — Telephone Encounter (Signed)
Rx Done . 

## 2011-02-07 NOTE — Telephone Encounter (Signed)
X Done-notation for Office visit needed.

## 2011-04-07 ENCOUNTER — Telehealth: Payer: Self-pay | Admitting: Internal Medicine

## 2011-04-07 NOTE — Telephone Encounter (Signed)
LMOM for patient to call and schedule OV.

## 2011-04-07 NOTE — Telephone Encounter (Signed)
Advise patient, she is due for a visit and for  Reclast (we'll schedule Reclast at the time of the visit)

## 2011-04-08 LAB — URINALYSIS, ROUTINE W REFLEX MICROSCOPIC
Glucose, UA: NEGATIVE
Hgb urine dipstick: NEGATIVE
Specific Gravity, Urine: 1.011

## 2011-04-08 LAB — CBC
Hemoglobin: 14
MCV: 93.9
RBC: 4.36
WBC: 6.2

## 2011-04-08 LAB — DIFFERENTIAL
Eosinophils Absolute: 0.1
Lymphs Abs: 1.3
Monocytes Absolute: 0.4
Monocytes Relative: 6
Neutrophils Relative %: 71

## 2011-04-08 LAB — ETHANOL: Alcohol, Ethyl (B): <5

## 2011-04-08 LAB — BASIC METABOLIC PANEL
Chloride: 107
Creatinine, Ser: 0.93
GFR calc Af Amer: >60
Potassium: 4.3
Sodium: 144

## 2011-04-08 LAB — RAPID URINE DRUG SCREEN, HOSP PERFORMED: Benzodiazepines: NOT DETECTED

## 2011-04-14 LAB — CBC
HCT: 40.5
MCV: 94.1
Platelets: 298
RBC: 4.3
WBC: 6.6

## 2011-04-14 LAB — URINALYSIS, ROUTINE W REFLEX MICROSCOPIC
Bilirubin Urine: NEGATIVE
Glucose, UA: NEGATIVE
Hgb urine dipstick: NEGATIVE
Ketones, ur: NEGATIVE
Nitrite: NEGATIVE
Protein, ur: NEGATIVE
Urobilinogen, UA: 0.2
pH: 6

## 2011-04-14 LAB — RAPID URINE DRUG SCREEN, HOSP PERFORMED
Amphetamines: NOT DETECTED
Tetrahydrocannabinol: NOT DETECTED

## 2011-04-14 LAB — BASIC METABOLIC PANEL
BUN: 13
Chloride: 109
GFR calc Af Amer: >60
GFR calc non Af Amer: >60
Potassium: 3.7
Sodium: 136

## 2011-04-14 LAB — DIFFERENTIAL
Eosinophils Absolute: 0.1
Eosinophils Relative: 1
Lymphocytes Relative: 24
Lymphs Abs: 1.6
Monocytes Relative: 7

## 2011-11-23 DIAGNOSIS — H04129 Dry eye syndrome of unspecified lacrimal gland: Secondary | ICD-10-CM | POA: Diagnosis not present

## 2011-11-25 DIAGNOSIS — R5381 Other malaise: Secondary | ICD-10-CM | POA: Diagnosis not present

## 2011-11-25 DIAGNOSIS — E785 Hyperlipidemia, unspecified: Secondary | ICD-10-CM | POA: Diagnosis not present

## 2011-11-25 DIAGNOSIS — F200 Paranoid schizophrenia: Secondary | ICD-10-CM | POA: Diagnosis not present

## 2011-11-29 DIAGNOSIS — Z13820 Encounter for screening for osteoporosis: Secondary | ICD-10-CM | POA: Diagnosis not present

## 2011-11-29 DIAGNOSIS — Z1231 Encounter for screening mammogram for malignant neoplasm of breast: Secondary | ICD-10-CM | POA: Diagnosis not present

## 2011-11-29 DIAGNOSIS — M949 Disorder of cartilage, unspecified: Secondary | ICD-10-CM | POA: Diagnosis not present

## 2011-12-20 ENCOUNTER — Encounter: Payer: Medicare Other | Admitting: Internal Medicine

## 2012-04-24 DIAGNOSIS — Z23 Encounter for immunization: Secondary | ICD-10-CM | POA: Diagnosis not present

## 2012-08-13 DIAGNOSIS — F200 Paranoid schizophrenia: Secondary | ICD-10-CM | POA: Diagnosis not present

## 2012-08-13 DIAGNOSIS — R5383 Other fatigue: Secondary | ICD-10-CM | POA: Diagnosis not present

## 2012-08-13 DIAGNOSIS — E785 Hyperlipidemia, unspecified: Secondary | ICD-10-CM | POA: Diagnosis not present

## 2012-09-27 ENCOUNTER — Ambulatory Visit (INDEPENDENT_AMBULATORY_CARE_PROVIDER_SITE_OTHER): Payer: Medicare Other | Admitting: Podiatry

## 2012-09-27 ENCOUNTER — Encounter: Payer: Self-pay | Admitting: Podiatry

## 2012-09-27 VITALS — BP 166/74 | HR 70 | Ht 64.5 in | Wt 156.0 lb

## 2012-09-27 DIAGNOSIS — I83893 Varicose veins of bilateral lower extremities with other complications: Secondary | ICD-10-CM

## 2012-09-27 DIAGNOSIS — Q828 Other specified congenital malformations of skin: Secondary | ICD-10-CM

## 2012-09-27 NOTE — Patient Instructions (Addendum)
Seen for painful corn 5th digit left. Also noted a small dark colored soft mass over left ankle that needed be looked at by a Vein Specialist. We will make referral arrangement.  Please wait for our call for this arrangement.  Return in 2 months.

## 2012-09-27 NOTE — Progress Notes (Signed)
Subjective: 79 y.o. year old female patient presents complaining of painful toe 5th left due to recurring corn. Patient requests toe corns and calluses trimmed. Patient wants to join exercise class and lose weight.   Review of Systems - General ROS: negative for - chills, fatigue, fever, hot flashes, night sweats, sleep disturbance or weight loss. Gained some weight (~8-10 lbs) past 1 year.   Objective: Dermatologic: Thick yellow deformed nails x 10. Dark purple soft mass at dorsum of left ankle over varicose vein. (R/O Hemangioma) Dark brown pigmented skin both feet from venous stasis.  Corns 5th digit left, painful. Plantar callus under 2nd MPJ bilateral.  Vascular: Right Dorsalis pedis faintly palpable. All other pulses not palpable.  Severe varicosity dorsum bilateral with herniated vein on left ankle anterior surface.  Orthopedic: Contracted lesser digit 5th left.   Neurologic: All epicritic and tactile sensations grossly intact.  Assessment: 1. Symptomatic digital corn 5th left with contracted digit.  2. Venous insufficiency and varicose vein both lower limb. 3. Superficial bulged out soft mass (0.8cm) with abnormal appearance left ankle.   Treatment: 1. Digital corns and hypertrophic nails debrided and padded 5th left. 2. Referral to Vein clinic to have the lesion checked out on left ankle.  Return in 2 months or as needed.

## 2012-09-28 ENCOUNTER — Encounter: Payer: Self-pay | Admitting: Internal Medicine

## 2012-09-28 ENCOUNTER — Ambulatory Visit (INDEPENDENT_AMBULATORY_CARE_PROVIDER_SITE_OTHER): Payer: Medicare Other | Admitting: Internal Medicine

## 2012-09-28 VITALS — BP 146/72 | HR 69 | Temp 97.7°F | Wt 157.0 lb

## 2012-09-28 DIAGNOSIS — I10 Essential (primary) hypertension: Secondary | ICD-10-CM

## 2012-09-28 NOTE — Progress Notes (Signed)
  Subjective:    Patient ID: Mikayla Marks, female    DOB: 09-Nov-1927, 79 y.o.   MRN: [id number]  HPI Last OV 04-2010 Concerned  about her BP. Went to the podiatrist yesterday for a callous treatment, they found her BP to be elevated at 166. She reports that was very anxious at that time (has schizophrenia: "The government sent police to my house, they were bombs in my neighborhood, poison gases") Also, she has been referred to the vein specialist at hospital for the treatment of varicose veins.   Past Medical History  Diagnosis Date  . HYPERTENSION   . Hyperlipidemia   . Schizoaffective disorder   . Osteoporosis   . SCC (squamous cell carcinoma)     in situ R pretibial area   Past Surgical History  Procedure Laterality Date  . Cataract extraction    . Cholecystectomy    . Partial hysterectomy      due to bleeding  . Tonsillectomy    . Breast biopsy      benign tumor    Social History: Daughter lives with her Mikayla Marks)  still drives widow Brother Mikayla Marks in [place], lives 5 min away [phone]   Review of Systems denies chest pain, occasional shortness or breath  ( "related to poison gases "). Occasional edema in the lower extremities, admits to occasional excessive salt intake. No headaches, no nosebleeds.     Objective:   Physical Exam BP 146/72  Pulse 69  Temp(Src) 97.7 F (36.5 C) (Oral)  Wt 157 lb (71.215 kg)  BMI 26.54 kg/m2  SpO2 96%  General -- alert, well-developed, vital signs are stable   Neck --no thyromegaly no JVD at 45,  Lungs -- normal respiratory effort, no intercostal retractions, no accessory muscle use, and normal breath sounds.   Heart-- normal rate, regular rhythm, no murmur, and no gallop.   Abdomen--soft, non-tender, no distention, no masses, no HSM, no guarding, and no rigidity.   Extremities-- trace  pretibial edema  found today  Psych--  not anxious or depressed appearing.  she does have poor contact with reality.       Assessment & Plan:    Varicose veins, reports she has been referred to the hospital vein specialist

## 2012-09-28 NOTE — Patient Instructions (Addendum)
Check the  blood pressure daily at the local pharmacy or store, be sure it is between 110/60 and 140/85. If it is consistently higher or lower, let me know. Please call with readings in 10 days

## 2012-09-29 ENCOUNTER — Encounter: Payer: Self-pay | Admitting: Internal Medicine

## 2012-09-29 NOTE — Assessment & Plan Note (Signed)
Hypertension?. I will communicate with her brother to be sure her BP is checked routinely and will base treatment on readings. Consent to communicate w/ him in the chart.  Reports she had a complete physical exam 04-2012 at another clinic [facility] [employer] ) , will try to get records.

## 2012-10-01 ENCOUNTER — Telehealth: Payer: Self-pay | Admitting: Internal Medicine

## 2012-10-01 ENCOUNTER — Other Ambulatory Visit: Payer: Self-pay

## 2012-10-01 DIAGNOSIS — I872 Venous insufficiency (chronic) (peripheral): Secondary | ICD-10-CM

## 2012-10-01 NOTE — Telephone Encounter (Signed)
Please advise 

## 2012-10-01 NOTE — Telephone Encounter (Signed)
Patient called requesting that we shred the DPR she signed appointing her daughter. She does not want [place] to speak with anyone regarding her health care. Please advise.

## 2012-10-08 NOTE — Telephone Encounter (Signed)
She has schizophrenia and is not in contact with reality, as long as we have a POA signed in the chart , we will go what by his brother recommend. Please be sure we had a POA (ask Caralyn Guile if questions)

## 2012-10-25 HISTORY — PX: LEG SURGERY: SHX1003

## 2012-11-12 ENCOUNTER — Encounter: Payer: Self-pay | Admitting: Vascular Surgery

## 2012-11-13 ENCOUNTER — Encounter: Payer: Self-pay | Admitting: Vascular Surgery

## 2012-11-13 ENCOUNTER — Ambulatory Visit (INDEPENDENT_AMBULATORY_CARE_PROVIDER_SITE_OTHER): Payer: Medicare Other | Admitting: Vascular Surgery

## 2012-11-13 ENCOUNTER — Encounter (INDEPENDENT_AMBULATORY_CARE_PROVIDER_SITE_OTHER): Payer: Medicare Other | Admitting: *Deleted

## 2012-11-13 VITALS — BP 129/65 | HR 63 | Resp 18 | Ht 64.0 in | Wt 156.0 lb

## 2012-11-13 DIAGNOSIS — I83893 Varicose veins of bilateral lower extremities with other complications: Secondary | ICD-10-CM | POA: Diagnosis not present

## 2012-11-13 DIAGNOSIS — I872 Venous insufficiency (chronic) (peripheral): Secondary | ICD-10-CM

## 2012-11-13 NOTE — Progress Notes (Signed)
Subjective:     Patient ID: Mikayla Marks, female   DOB: 22-Feb-1928, 80 y.o.   MRN: [id number]  HPI this 79 year old female is referred for severe venous insufficiency of both legs left worse than right. She recently was noted to have a scab over a bulge in the left foot by her dermatologist who was concerned about this. She has no history of DVT or thrombophlebitis. She has no history of stasis ulcers. She does have chronic swelling in both ankles and developed heavy aching throbbing discomfort in the thigh and calf area as the day progresses. She has noticed the skin the lower part of both legs has turned dark over the last few years. She does not know what caused the sore on the left foot where the scab is located. She has no history of bleeding. She does not wear elastic compression stockings.  Past Medical History  Diagnosis Date  . HYPERTENSION   . Hyperlipidemia   . Schizoaffective disorder   . Osteoporosis   . SCC (squamous cell carcinoma)     in situ R pretibial area    History  Substance Use Topics  . Smoking status: Never Smoker   . Smokeless tobacco: Never Used  . Alcohol Use: Not on file    No family history on file.  Allergies  Allergen Reactions  . Penicillins     Current outpatient prescriptions:cycloSPORINE (RESTASIS) 0.05 % ophthalmic emulsion, Place 1 drop into both eyes 2 (two) times daily., Disp: , Rfl: ;  ezetimibe (ZETIA) 10 MG tablet, , Disp: , Rfl: ;  Multiple Vitamins-Minerals (MULTIVITAMIN WITH MINERALS) tablet, Take 1 tablet by mouth daily., Disp: , Rfl:   BP 129/65  Pulse 63  Resp 18  Ht 5\' 4"  (1.626 m)  Wt 156 lb (70.761 kg)  BMI 26.76 kg/m2  Body mass index is 26.76 kg/(m^2).          Review of Systems denies chest pain, dyspnea on exertion, PND, orthopnea, claudication, lateralizing weakness. Does have a history of skin rashes complete review of systems otherwise unremarkable    Objective:   Physical Exam blood pressure 129/65 heart  rate 63 respirations 18 Gen.-alert and oriented x3 in no apparent distress HEENT normal for age Lungs no rhonchi or wheezing Cardiovascular regular rhythm no murmurs carotid pulses 3+ palpable no bruits audible Abdomen soft nontender no palpable masses Musculoskeletal free of  major deformities Skin clear -severe hyperpigmentation both lower legs lower third. Left leg with eschar measuring 3-4 mm in diameter over prominent pharynx on dorsum of foot. Bulging varicosities left calf medially over great saphenous system. No active ulcers noted. Right leg with varicosities calf great saphenous system severe hyperpigmentation and 1+ edema  Neurologic normal Lower extremities 3+ femoral and dorsalis pedis pulses palpable bilaterally with 1+ edema bilaterally  Today I ordered bilateral venous duplex exam which I reviewed and interpreted. The deep system has no DVT. There is gross reflux throughout both great saphenous and small saphenous systems bilaterally supplying these bulging varicosities and causing venous hypertension      Assessment:     Severe venous insufficiency bilaterally with gross reflux bilateral great saphenous and small saphenous systems with severe skin changes and lesion at risk for bleeding and left foot over the varix. This is causing symptoms affecting patient daily living    Plan:     #1 long-leg elastic compression stockings 20-30 mm gradient #2 elevate legs as much as possible during day #3 ibuprofen on a daily basis #4  return in 3 months. If no significant change patient needs staged laser ablation of bilateral great saphenous and small saphenous systems. The patient has bleeding from left varix ankle area in the meantime she should be in touch with [place] immediately

## 2012-11-18 ENCOUNTER — Telehealth: Payer: Self-pay | Admitting: Internal Medicine

## 2012-11-18 NOTE — Telephone Encounter (Signed)
Mikayla Marks, Please call the patient's brother,BP needs to be checked twice a week, let me know if it is more than 140 /  consistently. Also, schedule a followup 4-5 months from now  ---------- Records reviewed: In the last 2 years she has been under the care of Dr Fanny Dance Tdap 11/25/2011. Negative MMG 11-29-11 Bone density 11-2011 showed osteopenia with left hip T score -2.3  Labs 08/13/2012: CBC normal. TSH 2.2 Creatinine 0.9, potassium 4.5 Cholesterol 235, HDL 69, LDL 139 LFTs normal

## 2012-11-20 ENCOUNTER — Ambulatory Visit (INDEPENDENT_AMBULATORY_CARE_PROVIDER_SITE_OTHER): Payer: Medicare Other | Admitting: Vascular Surgery

## 2012-11-20 ENCOUNTER — Telehealth: Payer: Self-pay

## 2012-11-20 ENCOUNTER — Encounter: Payer: Self-pay | Admitting: Vascular Surgery

## 2012-11-20 DIAGNOSIS — R42 Dizziness and giddiness: Secondary | ICD-10-CM | POA: Diagnosis not present

## 2012-11-20 DIAGNOSIS — I8390 Asymptomatic varicose veins of unspecified lower extremity: Secondary | ICD-10-CM | POA: Diagnosis not present

## 2012-11-20 DIAGNOSIS — I83009 Varicose veins of unspecified lower extremity with ulcer of unspecified site: Secondary | ICD-10-CM | POA: Diagnosis not present

## 2012-11-20 DIAGNOSIS — Z833 Family history of diabetes mellitus: Secondary | ICD-10-CM | POA: Diagnosis not present

## 2012-11-20 DIAGNOSIS — Z79899 Other long term (current) drug therapy: Secondary | ICD-10-CM | POA: Diagnosis not present

## 2012-11-20 DIAGNOSIS — R55 Syncope and collapse: Secondary | ICD-10-CM | POA: Diagnosis not present

## 2012-11-20 DIAGNOSIS — I8310 Varicose veins of unspecified lower extremity with inflammation: Secondary | ICD-10-CM

## 2012-11-20 DIAGNOSIS — E785 Hyperlipidemia, unspecified: Secondary | ICD-10-CM | POA: Diagnosis not present

## 2012-11-20 DIAGNOSIS — R404 Transient alteration of awareness: Secondary | ICD-10-CM | POA: Diagnosis not present

## 2012-11-20 DIAGNOSIS — Z880 Allergy status to penicillin: Secondary | ICD-10-CM | POA: Diagnosis not present

## 2012-11-20 DIAGNOSIS — F319 Bipolar disorder, unspecified: Secondary | ICD-10-CM | POA: Diagnosis not present

## 2012-11-20 DIAGNOSIS — N189 Chronic kidney disease, unspecified: Secondary | ICD-10-CM | POA: Diagnosis not present

## 2012-11-20 DIAGNOSIS — L97909 Non-pressure chronic ulcer of unspecified part of unspecified lower leg with unspecified severity: Secondary | ICD-10-CM | POA: Diagnosis not present

## 2012-11-20 DIAGNOSIS — Z9889 Other specified postprocedural states: Secondary | ICD-10-CM | POA: Diagnosis not present

## 2012-11-20 DIAGNOSIS — D72829 Elevated white blood cell count, unspecified: Secondary | ICD-10-CM | POA: Diagnosis not present

## 2012-11-20 DIAGNOSIS — I6529 Occlusion and stenosis of unspecified carotid artery: Secondary | ICD-10-CM | POA: Diagnosis not present

## 2012-11-20 DIAGNOSIS — I658 Occlusion and stenosis of other precerebral arteries: Secondary | ICD-10-CM | POA: Diagnosis not present

## 2012-11-20 NOTE — Progress Notes (Signed)
Patient ID: Mikayla Marks, female   DOB: [birth year], 79 y.o.   MRN: [id number] This patient was evaluated by me last week for severe venous insufficiency with reflux in both great saphenous and small saphenous veins. The patient had an eschar measuring 3 mm in diameter over a prominent varix on the dorsum of her left foot which was worrisome for potential bleeding. Patient had no previous history of bleeding. Today we received a phone call from the daughter stating that the patient had severe bleeding from this area and EMS had been called. The patient was taken by EMS to the emergency department. Patient will need laser ablation of left great and small saphenous vein with sclerotherapy of this lesion as well as laser ablation of the right great and small saphenous vein to be performed ASAP. We will need to forego the normal three-month waiting period because of his severe bleeding which occurred and placed in the patient's life in jeopardy.  We will proceed with precertification to perform these procedures in the near future to hopefully prevent any further serious bleeding episodes

## 2012-11-20 NOTE — Telephone Encounter (Signed)
Pt's. Daughter called back approx. 15 minutes later, and gave a message to the receptionist to tell the nurse that she decided to have pt. taken to the hospital per EMS.  Declined bringing her to the office, as had been discussed earlier.  Dr. Hart Rochester was advised of this.

## 2012-11-20 NOTE — Telephone Encounter (Signed)
Spoke to pt's brother & he stated that the pt was taken to  [facility] this morning by ambulance. Pt's brother stated that "a varicose vein ruptured."

## 2012-11-20 NOTE — Telephone Encounter (Signed)
Rec'd call from pt's daughter.  Reports that EMS is at her mother's home.  States that "a vein on the left ankle area bled profusely and there was blood everywhere"  States the bleeding has stopped now, and is requesting that the pt. be treated at the office today.  Daughter very upset about the recent episode of bleeding.  While triaging the situation, the daughter stated "it's going to happen again, she needs to be treated today."  Daughter gave the telephone to the EMT, to further report information; the EMT stated pt's VSS; stated BP 130/60, HR 70, R. 20.  Stated the pt. Is complaining of nausea at this time.  The EMT confirmed that the bleeding @ left ankle site had stopped.  Stated there is a blood clot present there now.   Discussed with Dr. Hart Rochester.  Recommended to  Have pt. Come to office now, for evaluation.  Gave instructions to the daughter to apply a pressure bandage on the site, and transport pt. To office in the back seat with left leg elevated.  Daughter requested that the EMT be given same directions.  The EMT stated he would place a dry bandage on the site, and assist pt. To the car, but would not be able to bring to office via ambulance.  Daughter agreed to bring pt. In to office at this time.

## 2012-11-21 DIAGNOSIS — D72829 Elevated white blood cell count, unspecified: Secondary | ICD-10-CM | POA: Diagnosis not present

## 2012-11-21 DIAGNOSIS — I83009 Varicose veins of unspecified lower extremity with ulcer of unspecified site: Secondary | ICD-10-CM | POA: Diagnosis not present

## 2012-11-21 DIAGNOSIS — F319 Bipolar disorder, unspecified: Secondary | ICD-10-CM | POA: Diagnosis not present

## 2012-11-21 DIAGNOSIS — I6529 Occlusion and stenosis of unspecified carotid artery: Secondary | ICD-10-CM | POA: Diagnosis not present

## 2012-11-21 DIAGNOSIS — R55 Syncope and collapse: Secondary | ICD-10-CM | POA: Diagnosis not present

## 2012-11-21 NOTE — Telephone Encounter (Signed)
Noted  

## 2012-11-27 ENCOUNTER — Encounter: Payer: Self-pay | Admitting: Internal Medicine

## 2012-11-27 ENCOUNTER — Other Ambulatory Visit: Payer: Self-pay | Admitting: *Deleted

## 2012-11-27 ENCOUNTER — Ambulatory Visit: Payer: Medicare Other | Admitting: Podiatry

## 2012-11-27 DIAGNOSIS — I83893 Varicose veins of bilateral lower extremities with other complications: Secondary | ICD-10-CM

## 2012-12-07 ENCOUNTER — Encounter: Payer: Self-pay | Admitting: Vascular Surgery

## 2012-12-10 ENCOUNTER — Encounter: Payer: Self-pay | Admitting: Vascular Surgery

## 2012-12-10 ENCOUNTER — Ambulatory Visit (INDEPENDENT_AMBULATORY_CARE_PROVIDER_SITE_OTHER): Payer: Medicare Other | Admitting: Vascular Surgery

## 2012-12-10 VITALS — BP 161/72 | HR 75 | Resp 18 | Ht 64.75 in | Wt 157.0 lb

## 2012-12-10 DIAGNOSIS — I83893 Varicose veins of bilateral lower extremities with other complications: Secondary | ICD-10-CM

## 2012-12-10 NOTE — Progress Notes (Signed)
Subjective:     Patient ID: Mikayla Marks, female   DOB: 1927/12/24, 79 y.o.   MRN: [id number]  HPI this 79 year old female had laser ablation of the left great saphenous vein performed under local tumescent anesthesia from the knee to the saphenofemoral junction. She has venous hypertension with gross reflux and a history of bleeding from a varix in the left ankle requiring a trip to the emergency department. A total of 1829 J of energy was utilized. She tolerated the procedure well.  Review of Systems     Objective:   Physical Exam BP 161/72  Pulse 75  Resp 18  Ht 5' 4.75" (1.645 m)  Wt 157 lb (71.215 kg)  BMI 26.32 kg/m2       Assessment:     Well-tolerated laser ablation left great saphenous vein for venous hypertension with history of bleeding varix performed under local tumescent anesthesia Patient also had sclerotherapy performed of varix at the ankle where bleeding occurred on last Wednesday in May 2014    Plan:     Return June 24 for a venous duplex exam left leg to confirm closure left great saphenous vein We'll then schedule for left small saphenous vein laser ablation

## 2012-12-10 NOTE — Progress Notes (Signed)
Laser Ablation Procedure      Date: 12/10/2012    Mikayla Marks DOB:January 21, 1928  Consent signed: Yes  Surgeon:J.D. Hart [place]  Procedure: Laser Ablation: left Greater Saphenous Vein  BP 161/72  Pulse 75  Resp 18  Ht 5' 4.75" (1.645 m)  Wt 157 lb (71.215 kg)  BMI 26.32 kg/m2  Start time: 3:10   End time: 4:00  Tumescent Anesthesia: 200 cc 0.9% NaCl with 50 cc Lidocaine HCL with 1% Epi and 15 cc 8.4% NaHCO3  Local Anesthesia: 4 cc Lidocaine HCL and NaHCO3 (ratio 2:1)  Pulsed mode:yes Watts 15 Seconds 1 Pulses:1 Total Pulses:122 Total Energy: 1829 Total Time: 2:01   Sclerotherapy: .3 %Sotradecol. Patient received a total of 2 cc    Patient tolerated procedure well: Yes  Notes:   Description of Procedure:  After marking the course of the saphenous vein and the secondary varicosities in the standing position, the patient was placed on the operating table in the supine position, and the left leg was prepped and draped in sterile fashion. Local anesthetic was administered, and under ultrasound guidance the saphenous vein was accessed with a micro needle and guide wire; then the micro puncture sheath was placed. A guide wire was inserted to the saphenofemoral junction, followed by a 5 french sheath.  The position of the sheath and then the laser fiber below the junction was confirmed using the ultrasound and visualization of the aiming beam.  Tumescent anesthesia was administered along the course of the saphenous vein using ultrasound guidance. Protective laser glasses were placed on the patient, and the laser was fired at 15 watt pulsed mode advancing 1-2 mm per sec.  For a total of 1829 joules.  A steri strip was applied to the puncture site.  .    Sclerotherapy was performed to varix that bled in May, Two cc .3% Sotradecol solution via a 27g butterfly needle.  ABD pads and thigh high compression stockings were applied.  Ace wrap bandages were applied over the phlebectomy sites and at  the top of the saphenofemoral junction.  Blood loss was less than 15 cc.  The patient ambulated out of the operating room having tolerated the procedure well.

## 2012-12-11 ENCOUNTER — Other Ambulatory Visit: Payer: Self-pay | Admitting: *Deleted

## 2012-12-11 ENCOUNTER — Telehealth: Payer: Self-pay | Admitting: *Deleted

## 2012-12-11 DIAGNOSIS — I83893 Varicose veins of bilateral lower extremities with other complications: Secondary | ICD-10-CM

## 2012-12-11 NOTE — Telephone Encounter (Signed)
Pt doing well. Following all instructions. Not having much pain. Reminded her of her fu appt. Scheduled her next procedure too.

## 2012-12-17 ENCOUNTER — Encounter: Payer: Self-pay | Admitting: Vascular Surgery

## 2012-12-18 ENCOUNTER — Ambulatory Visit (INDEPENDENT_AMBULATORY_CARE_PROVIDER_SITE_OTHER): Payer: Medicare Other | Admitting: Vascular Surgery

## 2012-12-18 ENCOUNTER — Encounter: Payer: Self-pay | Admitting: Vascular Surgery

## 2012-12-18 ENCOUNTER — Other Ambulatory Visit: Payer: Self-pay | Admitting: *Deleted

## 2012-12-18 ENCOUNTER — Encounter (INDEPENDENT_AMBULATORY_CARE_PROVIDER_SITE_OTHER): Payer: Medicare Other | Admitting: *Deleted

## 2012-12-18 VITALS — BP 154/74 | HR 88 | Resp 16 | Ht 64.75 in | Wt 156.0 lb

## 2012-12-18 DIAGNOSIS — I83893 Varicose veins of bilateral lower extremities with other complications: Secondary | ICD-10-CM

## 2012-12-18 NOTE — Progress Notes (Signed)
Subjective:     Patient ID: Mikayla Marks, female   DOB: [birth year], 79 y.o.   MRN: [id number]  HPI this 79 year old female is one week post laser ablation left great saphenous vein for venous hypertension. She had bulging varicosities with 2 episodes of bleeding. She has done well since her procedure. She has worn elastic compression stockings and taking ibuprofen as instructed with mild discomfort in the left thigh. She also had sclerotherapy of the area of the bleeding. She has had no recurrent bleeding to date.  Past Medical History  Diagnosis Date  . HYPERTENSION   . Hyperlipidemia   . Schizoaffective disorder   . Osteoporosis   . SCC (squamous cell carcinoma)     in situ R pretibial area    History  Substance Use Topics  . Smoking status: Never Smoker   . Smokeless tobacco: Never Used  . Alcohol Use: Not on file    No family history on file.  Allergies  Allergen Reactions  . Penicillins     Current outpatient prescriptions:cycloSPORINE (RESTASIS) 0.05 % ophthalmic emulsion, Place 1 drop into both eyes 2 (two) times daily., Disp: , Rfl: ;  ezetimibe (ZETIA) 10 MG tablet, , Disp: , Rfl: ;  Multiple Vitamins-Minerals (MULTIVITAMIN WITH MINERALS) tablet, Take 1 tablet by mouth daily., Disp: , Rfl:   BP 154/74  Pulse 88  Resp 16  Ht 5' 4.75" (1.645 m)  Wt 156 lb (70.761 kg)  BMI 26.15 kg/m2  Body mass index is 26.15 kg/(m^2).          Review of Systems denies chest pain, dyspnea on exertion, PND, orthopnea, hemoptysis, claudication     Objective:   Physical Exam blood pressure 154/74 heart rate 88 respirations 16 General well-developed well-nourished female in no apparent stress alert and oriented x3 Lungs no rhonchi or wheezing Left leg with mild tenderness along the course of great saphenous vein from the saphenofemoral junction. Bulging varicosities in the medial calf the left leg below the knee. 3 posterior cells pedis pulse palpable.  Today I ordered a  venous duplex exam of the left leg which are reviewed and interpreted. Great saphenous vein is totally occluded from the distal thigh to near the saphenofemoral junction and there is no DVT.     Assessment:     Status post laser ablation left great saphenous vein-successful with severe venous hypertension and history of bleeding x2     Plan:     Plan laser ablation left small saphenous vein next week and we'll then proceed in the near future with laser ablation of right great and small saphenous veins for similar problem

## 2012-12-21 ENCOUNTER — Encounter: Payer: Self-pay | Admitting: Vascular Surgery

## 2012-12-21 DIAGNOSIS — I83893 Varicose veins of bilateral lower extremities with other complications: Secondary | ICD-10-CM | POA: Diagnosis not present

## 2012-12-21 DIAGNOSIS — E785 Hyperlipidemia, unspecified: Secondary | ICD-10-CM | POA: Diagnosis not present

## 2012-12-21 DIAGNOSIS — I6529 Occlusion and stenosis of unspecified carotid artery: Secondary | ICD-10-CM | POA: Diagnosis not present

## 2012-12-24 ENCOUNTER — Ambulatory Visit (INDEPENDENT_AMBULATORY_CARE_PROVIDER_SITE_OTHER): Payer: Medicare Other | Admitting: Vascular Surgery

## 2012-12-24 ENCOUNTER — Encounter: Payer: Self-pay | Admitting: Vascular Surgery

## 2012-12-24 VITALS — BP 167/77 | HR 69 | Resp 16 | Ht 64.0 in | Wt 156.0 lb

## 2012-12-24 DIAGNOSIS — I83893 Varicose veins of bilateral lower extremities with other complications: Secondary | ICD-10-CM

## 2012-12-24 NOTE — Progress Notes (Signed)
Laser Ablation Procedure      Date: 12/24/2012    Ermal Brzozowski DOB:02-11-1928  Consent signed: Yes  Surgeon:J.D. Hart [place]  Procedure: Laser Ablation: left Small Saphenous Vein  BP 167/77  Pulse 69  Resp 16  Ht 5\' 4"  (1.626 m)  Wt 156 lb (70.761 kg)  BMI 26.76 kg/m2  Start time: 1:15   End time: 1:45  Tumescent Anesthesia: 200 cc 0.9% NaCl with 50 cc Lidocaine HCL with 1% Epi and 15 cc 8.4% NaHCO3  Local Anesthesia: 4 cc Lidocaine HCL and NaHCO3 (ratio 2:1)  Pulsed mode:yes Watts 15 Seconds 1 Pulses:1 Total Pulses:72 Total Energy: 1080 Total Time: 1:12     Patient tolerated procedure well: Yes  Notes:   Description of Procedure:  After marking the course of the saphenous vein and the secondary varicosities in the standing position, the patient was placed on the operating table in the prone position, and the left leg was prepped and draped in sterile fashion. Local anesthetic was administered, and under ultrasound guidance the saphenous vein was accessed with a micro needle and guide wire; then the micro puncture sheath was placed. A guide wire was inserted to the saphenopopliteal junction, followed by a 5 french sheath.  The position of the sheath and then the laser fiber below the junction was confirmed using the ultrasound and visualization of the aiming beam.  Tumescent anesthesia was administered along the course of the saphenous vein using ultrasound guidance. Protective laser glasses were placed on the patient, and the laser was fired at 15 watt pulsed mode advancing 1-2 mm per sec.  For a total of 1080 joules.  A steri strip was applied to the puncture site.    ABD pads and thigh high compression stockings were applied.  Ace wrap bandages were applied over the phlebectomy sites and at the top of the saphenopopliteal junction.  Blood loss was less than 15 cc.  The patient ambulated out of the operating room having tolerated the procedure well.

## 2012-12-24 NOTE — Progress Notes (Signed)
Subjective:     Patient ID: Mikayla Marks, female   DOB: [birth year], 79 y.o.   MRN: [id number]  HPI this 79 year old female had laser ablation of the left small saphenous vein performed under local tumescent anesthesia venous hypertension with a history of a bleeding varix in the left foot. A total of 1080 J of energy was utilized. She tolerated the procedure well.   Review of Systems     Objective:   Physical Exam BP 167/77  Pulse 69  Resp 16  Ht 5\' 4"  (1.626 m)  Wt 156 lb (70.761 kg)  BMI 26.76 kg/m2       Assessment:     Well-tolerated laser ablation left small saphenous vein performed under local tumescent anesthesia for venous hypertension with history of bleeding    Plan:     Return in one week for venous duplex exam to confirm closure left small saphenous vein

## 2012-12-25 ENCOUNTER — Telehealth: Payer: Self-pay | Admitting: *Deleted

## 2012-12-25 NOTE — Telephone Encounter (Signed)
Pt doing well.No pain or problems. Reminded her of her of her fu appt next week.

## 2012-12-31 ENCOUNTER — Encounter: Payer: Self-pay | Admitting: Vascular Surgery

## 2013-01-01 ENCOUNTER — Encounter: Payer: Self-pay | Admitting: Vascular Surgery

## 2013-01-01 ENCOUNTER — Encounter (INDEPENDENT_AMBULATORY_CARE_PROVIDER_SITE_OTHER): Payer: Medicare Other | Admitting: *Deleted

## 2013-01-01 ENCOUNTER — Ambulatory Visit (INDEPENDENT_AMBULATORY_CARE_PROVIDER_SITE_OTHER): Payer: Medicare Other | Admitting: Vascular Surgery

## 2013-01-01 VITALS — BP 140/61 | HR 61 | Ht 64.0 in | Wt 158.0 lb

## 2013-01-01 DIAGNOSIS — I83893 Varicose veins of bilateral lower extremities with other complications: Secondary | ICD-10-CM | POA: Diagnosis not present

## 2013-01-01 DIAGNOSIS — Z48812 Encounter for surgical aftercare following surgery on the circulatory system: Secondary | ICD-10-CM | POA: Diagnosis not present

## 2013-01-01 NOTE — Progress Notes (Signed)
Subjective:     Patient ID: Mikayla Marks, female   DOB: 1927/08/26, 79 y.o.   MRN: [id number]  HPI this 79 year old female returns for initial followup regarding her laser ablation of the left small saphenous vein venous hypertension and history of bleeding from a varix in the left leg. She had previously undergone laser ablation of the left great saphenous vein. She states her leg feels much better with less distal swelling-an improved appearance-and decreased aching and throbbing discomfort.  Past Medical History  Diagnosis Date  . HYPERTENSION   . Hyperlipidemia   . Schizoaffective disorder   . Osteoporosis   . SCC (squamous cell carcinoma)     in situ R pretibial area    History  Substance Use Topics  . Smoking status: Never Smoker   . Smokeless tobacco: Never Used  . Alcohol Use: Not on file    History reviewed. No pertinent family history.  Allergies  Allergen Reactions  . Penicillins     Current outpatient prescriptions:cycloSPORINE (RESTASIS) 0.05 % ophthalmic emulsion, Place 1 drop into both eyes 2 (two) times daily., Disp: , Rfl: ;  ezetimibe (ZETIA) 10 MG tablet, , Disp: , Rfl: ;  Multiple Vitamins-Minerals (MULTIVITAMIN WITH MINERALS) tablet, Take 1 tablet by mouth daily., Disp: , Rfl:   BP 140/61  Pulse 61  Ht 5\' 4"  (1.626 m)  Wt 158 lb (71.668 kg)  BMI 27.11 kg/m2  SpO2 100%  Body mass index is 27.11 kg/(m^2).           Review of Systems denies chest pain, dyspnea on exertion, PND, orthopnea, hemoptysis.    Objective:   Physical Exam blood pressure 140/61 heart rate 61 respirations 16 General well-developed well-nourished female in no apparent distress alert and oriented x3 Lungs no rhonchi or wheezing Left lower extremity with 3+ femoral and 2+ dorsalis pedis pulse palpable. Minimal distal edema. Mild discomfort along course of left small saphenous vein.  Today I ordered a venous duplex exam of the left leg which are reviewed and interpreted.  There is no DVT. There is total closure of the left small saphenous and left great saphenous veins.     Assessment:     Successful and well-tolerated laser ablation left great and small saphenous veins for venous hypertension and history of bleeding varix    Plan:     Return next week for laser ablation right great saphenous vein

## 2013-01-04 ENCOUNTER — Encounter: Payer: Self-pay | Admitting: Vascular Surgery

## 2013-01-07 ENCOUNTER — Encounter: Payer: Self-pay | Admitting: Vascular Surgery

## 2013-01-07 ENCOUNTER — Ambulatory Visit (INDEPENDENT_AMBULATORY_CARE_PROVIDER_SITE_OTHER): Payer: Medicare Other | Admitting: Vascular Surgery

## 2013-01-07 VITALS — BP 156/77 | HR 74 | Resp 18 | Ht 64.0 in | Wt 156.0 lb

## 2013-01-07 DIAGNOSIS — I83893 Varicose veins of bilateral lower extremities with other complications: Secondary | ICD-10-CM

## 2013-01-07 NOTE — Progress Notes (Signed)
Subjective:     Patient ID: Mikayla Marks, female   DOB: 03-Nov-1927, 79 y.o.   MRN: [id number]  HPI this 79 year old female had laser ablation right great saphenous vein performed under local tumescent anesthesia venous hypertension and edema with pain. She tolerated the procedure well. A total of 1500 J of energy was utilized  Review of Systems     Objective:   Physical Exam BP 156/77  Pulse 74  Resp 18  Ht 5\' 4"  (1.626 m)  Wt 156 lb (70.761 kg)  BMI 26.76 kg/m2        Assessment:     Well-tolerated laser ablation right great saphenous vein performed under local tumescent anesthesia    Plan:     Return in one week for venous duplex exam to confirm closure right great saphenous vein. Within be scheduled for right small saphenous laser ablation

## 2013-01-07 NOTE — Progress Notes (Signed)
Laser Ablation Procedure      Date: 01/07/2013    Mikayla Marks DOB:06/05/1928  Consent signed: Yes  Surgeon:J.D. Hart [place]  Procedure: Laser Ablation: right Greater Saphenous Vein  BP 156/77  Pulse 74  Resp 18  Ht 5\' 4"  (1.626 m)  Wt 156 lb (70.761 kg)  BMI 26.76 kg/m2  Start time: 1:00   End time: 1:30  Tumescent Anesthesia: 150 cc 0.9% NaCl with 50 cc Lidocaine HCL with 1% Epi and 15 cc 8.4% NaHCO3  Local Anesthesia: 4 cc Lidocaine HCL and NaHCO3 (ratio 2:1)  Pulsed mode:yes Watts 15 Seconds 1 Pulses:1 Total Pulses:100 Total Energy: 1500 Total Time: 1:40     Patient tolerated procedure well: Yes  Notes:   Description of Procedure:  After marking the course of the saphenous vein and the secondary varicosities in the standing position, the patient was placed on the operating table in the supine position, and the right leg was prepped and draped in sterile fashion. Local anesthetic was administered, and under ultrasound guidance the saphenous vein was accessed with a micro needle and guide wire; then the micro puncture sheath was placed. A guide wire was inserted to the saphenofemoral junction, followed by a 5 french sheath.  The position of the sheath and then the laser fiber below the junction was confirmed using the ultrasound and visualization of the aiming beam.  Tumescent anesthesia was administered along the course of the saphenous vein using ultrasound guidance. Protective laser glasses were placed on the patient, and the laser was fired at 15 watt pulsed mode advancing 1-2 mm per sec.  For a total of 1500 joules.  A steri strip was applied to the puncture site.    ABD pads and thigh high compression stockings were applied.    Blood loss was less than 15 cc.  The patient ambulated out of the operating room having tolerated the procedure well.

## 2013-01-08 ENCOUNTER — Telehealth: Payer: Self-pay | Admitting: *Deleted

## 2013-01-08 NOTE — Telephone Encounter (Signed)
Patient doing well. No pain or problems. Following all instructions.Reminded her of her fu appt next week.

## 2013-01-14 ENCOUNTER — Encounter: Payer: Self-pay | Admitting: Vascular Surgery

## 2013-01-15 ENCOUNTER — Ambulatory Visit (INDEPENDENT_AMBULATORY_CARE_PROVIDER_SITE_OTHER): Payer: Medicare Other | Admitting: Vascular Surgery

## 2013-01-15 ENCOUNTER — Encounter (INDEPENDENT_AMBULATORY_CARE_PROVIDER_SITE_OTHER): Payer: Medicare Other | Admitting: *Deleted

## 2013-01-15 ENCOUNTER — Encounter: Payer: Self-pay | Admitting: Vascular Surgery

## 2013-01-15 VITALS — BP 150/82 | HR 81 | Resp 18 | Ht 64.75 in | Wt 158.0 lb

## 2013-01-15 DIAGNOSIS — I83893 Varicose veins of bilateral lower extremities with other complications: Secondary | ICD-10-CM

## 2013-01-15 NOTE — Progress Notes (Signed)
Subjective:     Patient ID: Mikayla Marks, female   DOB: July 26, 1927, 79 y.o.   MRN: [id number]  HPI this 79 year old female returns 1 week post laser ablation right great saphenous vein for venous hypertension with painful varicosities. She has had some mild to moderate discomfort along the course of the right great saphenous vein which is improving daily. She has been wearing her long light elastic compression stocking. She took ibuprofen as instructed. She does still have some very tense veins on the dorsum of the right foot.  Past Medical History  Diagnosis Date  . HYPERTENSION   . Hyperlipidemia   . Schizoaffective disorder   . Osteoporosis   . SCC (squamous cell carcinoma)     in situ R pretibial area    History  Substance Use Topics  . Smoking status: Never Smoker   . Smokeless tobacco: Never Used  . Alcohol Use: Not on file    No family history on file.  Allergies  Allergen Reactions  . Penicillins     Current outpatient prescriptions:cycloSPORINE (RESTASIS) 0.05 % ophthalmic emulsion, Place 1 drop into both eyes 2 (two) times daily., Disp: , Rfl: ;  ezetimibe (ZETIA) 10 MG tablet, , Disp: , Rfl: ;  Multiple Vitamins-Minerals (MULTIVITAMIN WITH MINERALS) tablet, Take 1 tablet by mouth daily., Disp: , Rfl:   BP 150/82  Pulse 81  Resp 18  Ht 5' 4.75" (1.645 m)  Wt 158 lb (71.668 kg)  BMI 26.48 kg/m2  Body mass index is 26.48 kg/(m^2).           Review of Systems denies chest pain, dyspnea on exertion, PND, orthopnea, hemoptysis    Objective:   Physical Exam blood pressure 150/82 heart rate 81 respirations 18 General well-developed well-nourished female in no apparent distress alert and oriented x3 Lungs no rhonchi or wheezing Right lower extremity with mild tenderness along the course right great saphenous vein. There are some tense varicosities on the dorsum of the right foot.  Today I ordered a venous duplex exam of the right leg which are reviewed  and interpreted. The right great saphenous vein is totally closed from the distal thigh to near the saphenofemoral junction. There is no DVT.     Assessment:     Successful laser ablation right great saphenous vein for venous hypertension and painful varicosities    Plan:     Return in one week for similar procedure in the right small saphenous vein to complete treatment regimen

## 2013-01-18 ENCOUNTER — Encounter: Payer: Self-pay | Admitting: Vascular Surgery

## 2013-01-21 ENCOUNTER — Ambulatory Visit (INDEPENDENT_AMBULATORY_CARE_PROVIDER_SITE_OTHER): Payer: Medicare Other | Admitting: Vascular Surgery

## 2013-01-21 ENCOUNTER — Encounter: Payer: Self-pay | Admitting: Vascular Surgery

## 2013-01-21 VITALS — BP 158/84 | HR 79 | Resp 16 | Ht 64.75 in | Wt 156.0 lb

## 2013-01-21 DIAGNOSIS — I83893 Varicose veins of bilateral lower extremities with other complications: Secondary | ICD-10-CM

## 2013-01-21 NOTE — Progress Notes (Signed)
Subjective:     Patient ID: Mikayla Marks, female   DOB: 1927-08-25, 79 y.o.   MRN: [id number]  HPI this 79 year old female had laser ablation of the right small saphenous vein performed under local tumescent anesthesia for venous hypertension with pain and swelling. A total of 1222 J of energy was utilized. She tolerated the procedure well.   Review of Systems     Objective:   Physical Exam BP 158/84  Pulse 79  Resp 16  Ht 5' 4.75" (1.645 m)  Wt 156 lb (70.761 kg)  BMI 26.15 kg/m2        Assessment:     Well-tolerated laser ablation right small saphenous vein performed under local tumescent anesthesia for venous hypertension    Plan:     Return in one week for venous duplex exam to confirm closure right small saphenous vein and this will complete her treatment regimen

## 2013-01-21 NOTE — Progress Notes (Signed)
Laser Ablation Procedure      Date: 01/21/2013    Mikayla Marks DOB:1927-09-11  Consent signed: Yes  Surgeon:J.D. Hart [place]  Procedure: Laser Ablation: right Small Saphenous Vein  BP 158/84  Pulse 79  Resp 16  Ht 5' 4.75" (1.645 m)  Wt 156 lb (70.761 kg)  BMI 26.15 kg/m2  Start time: 1:00   End time: 1:30  Tumescent Anesthesia: 225 cc 0.9% NaCl with 50 cc Lidocaine HCL with 1% Epi and 15 cc 8.4% NaHCO3  Local Anesthesia: 4 cc Lidocaine HCL and NaHCO3 (ratio 2:1)  Pulsed Mode: 15 watts, 500 ms delay, 1.9 duration Total pulses: 82, total energy: 1226, total time: 1:21       Patient tolerated procedure well: Yes  Notes:   Description of Procedure:  After marking the course of the saphenous vein and the secondary varicosities in the standing position, the patient was placed on the operating table in the prone position, and the right leg was prepped and draped in sterile fashion. Local anesthetic was administered, and under ultrasound guidance the saphenous vein was accessed with a micro needle and guide wire; then the micro puncture sheath was placed. A guide wire was inserted to the saphenopopliteal junction, followed by a 5 french sheath.  The position of the sheath and then the laser fiber below the junction was confirmed using the ultrasound and visualization of the aiming beam.  Tumescent anesthesia was administered along the course of the saphenous vein using ultrasound guidance. Protective laser glasses were placed on the patient, and the laser was fired at 15 watts  For a total of 1226 joules.  A steri strip was applied to the puncture site.    ABD pads and thigh high compression stockings were applied.  Ace wrap bandages.  Blood loss was less than 15 cc.  The patient ambulated out of the operating room having tolerated the procedure well.

## 2013-01-22 ENCOUNTER — Telehealth: Payer: Self-pay | Admitting: *Deleted

## 2013-01-22 NOTE — Telephone Encounter (Signed)
Pt doing well. Mild discomfort. Following all instructions. Reminded her of her fu appt. Next week.

## 2013-01-28 ENCOUNTER — Encounter: Payer: Self-pay | Admitting: Vascular Surgery

## 2013-01-28 DIAGNOSIS — E785 Hyperlipidemia, unspecified: Secondary | ICD-10-CM | POA: Diagnosis not present

## 2013-01-28 DIAGNOSIS — F200 Paranoid schizophrenia: Secondary | ICD-10-CM | POA: Diagnosis not present

## 2013-01-28 DIAGNOSIS — I83893 Varicose veins of bilateral lower extremities with other complications: Secondary | ICD-10-CM | POA: Diagnosis not present

## 2013-01-29 ENCOUNTER — Ambulatory Visit (INDEPENDENT_AMBULATORY_CARE_PROVIDER_SITE_OTHER): Payer: Medicare Other | Admitting: Vascular Surgery

## 2013-01-29 ENCOUNTER — Encounter: Payer: Self-pay | Admitting: Vascular Surgery

## 2013-01-29 ENCOUNTER — Encounter (INDEPENDENT_AMBULATORY_CARE_PROVIDER_SITE_OTHER): Payer: Medicare Other

## 2013-01-29 VITALS — BP 170/69 | HR 68 | Resp 18 | Ht 65.0 in | Wt 154.1 lb

## 2013-01-29 DIAGNOSIS — I83893 Varicose veins of bilateral lower extremities with other complications: Secondary | ICD-10-CM

## 2013-01-29 DIAGNOSIS — Z48812 Encounter for surgical aftercare following surgery on the circulatory system: Secondary | ICD-10-CM | POA: Diagnosis not present

## 2013-01-29 NOTE — Progress Notes (Signed)
Patient is a for followup of her right small saphenous vein ablation. She prior undergone a right great saphenous and left greater small saphenous vein ablation. This is for severe venous hypertension.. She has done well with mild discomfort in her right calf region. He has been compliant with her compression garments.  Physical exam shows a some mild tenderness over the posterior right calf and some thickening over the area of the small saphenous vein.  Venous duplex today reveals closure of her small saphenous vein with no evidence of popliteal DVT.  Impression and plan stable followup after her ablation of her bilateral great and small saphenous veins. She will continue her use of her compression garment for one additional week. She is planning a trip to [place] with her daughter and I felt this would be completely appropriate with no needed extra precautions. We will see her again on an as-needed basis

## 2013-02-04 ENCOUNTER — Ambulatory Visit (INDEPENDENT_AMBULATORY_CARE_PROVIDER_SITE_OTHER): Payer: Medicare Other | Admitting: Podiatry

## 2013-02-04 ENCOUNTER — Telehealth: Payer: Self-pay | Admitting: *Deleted

## 2013-02-04 DIAGNOSIS — L84 Corns and callosities: Secondary | ICD-10-CM | POA: Insufficient documentation

## 2013-02-04 NOTE — Telephone Encounter (Signed)
Spoke with Mikayla Marks (the patient) and her daughter, [name], about the need for sclerotherapy on some large reticulars on the top of her right leg. The daughter is afraid these veins will bleed. I assured her that these kind of veins do not bleed the way surface spider veins bleed. Further explained how the venous hypertension is gone since the laser ablation procedures have been don. The patient will make a fu appt to return in 3 months to see if something further needs to be done (sclero 0r stab phleb).

## 2013-02-04 NOTE — Progress Notes (Signed)
Subjective: 79 y.o. year old female patient presents requesting toe nails trimmed and corn on 5th digit left trimmed.  Stated that she had broken vein while taking a shower. She was admitted in ER and had done emergency vein surgery.  She is scheduled to go on a vacation but concerned with the right foot varicose vein. Her daughter wanted to have it corrected before she goes on her vacation.   Objective: Dermatologic: Hypertrophic nails x 10. Digital corn 5th left. No open lesions. Vascular: Posterior tibial pulses are palpable on both feet, dorsalis pedis pulses are not palpable on both feet. Previous soft tissue mass has been resolved with recent surgery.  Positive of residual varicose vein over dorsum right.  Orthopedic: Contracted lesser digits bilateral. Neurologic: All epicritic and tactile sensations grossly intact.  Assessment: Dystrophic mycotic nails x 10. Digital corn 5th left. Varicose vein dorsum right.  Treatment: All mycotic nails, corns, calluses debrided.  Patient will take care of the varicose vein on right before she goes on her vacation.  Return in 3 months or as needed.

## 2013-02-19 ENCOUNTER — Ambulatory Visit: Payer: Medicare Other | Admitting: Vascular Surgery

## 2013-07-01 ENCOUNTER — Encounter: Payer: Self-pay | Admitting: Vascular Surgery

## 2013-07-02 ENCOUNTER — Ambulatory Visit: Payer: Medicare Other | Admitting: Vascular Surgery

## 2013-07-04 ENCOUNTER — Encounter: Payer: Self-pay | Admitting: Vascular Surgery

## 2013-07-05 ENCOUNTER — Ambulatory Visit: Payer: Medicare Other | Admitting: Vascular Surgery

## 2013-07-08 ENCOUNTER — Ambulatory Visit: Payer: Medicare Other | Admitting: Vascular Surgery

## 2013-07-10 ENCOUNTER — Ambulatory Visit (INDEPENDENT_AMBULATORY_CARE_PROVIDER_SITE_OTHER): Payer: Medicare Other | Admitting: Podiatry

## 2013-07-10 ENCOUNTER — Encounter: Payer: Self-pay | Admitting: Podiatry

## 2013-07-10 VITALS — BP 163/78 | HR 78 | Ht 64.75 in | Wt 149.0 lb

## 2013-07-10 DIAGNOSIS — M2040 Other hammer toe(s) (acquired), unspecified foot: Secondary | ICD-10-CM

## 2013-07-10 DIAGNOSIS — L84 Corns and callosities: Secondary | ICD-10-CM

## 2013-07-10 DIAGNOSIS — M25579 Pain in unspecified ankle and joints of unspecified foot: Secondary | ICD-10-CM

## 2013-07-10 DIAGNOSIS — M25572 Pain in left ankle and joints of left foot: Secondary | ICD-10-CM | POA: Insufficient documentation

## 2013-07-10 NOTE — Progress Notes (Signed)
Subjective:  80 y.o. year old female patient presents requesting corn on 5th digit left trimmed.   Objective: Dermatologic: Hypertrophic nails x 10.  Digital corn 5th left. No open lesions.  Vascular: Posterior tibial pulses are palpable on both feet, dorsalis pedis pulses are not palpable on both feet.  Positive of severe varicose vein over dorsum right.  Orthopedic: Contracted lesser digits bilateral.  Neurologic: All epicritic and tactile sensations grossly intact.   Assessment:  Dystrophic mycotic nails x 10.  Digital corn 5th left.  Varicose vein dorsum right.   Treatment: All mycotic nails, corns, calluses debrided.  Patient will take care of the varicose vein on right before she goes on her vacation.  Return in 3 months or as needed.

## 2013-07-10 NOTE — Patient Instructions (Signed)
Seen for painful corn and hypertrophic nails. All nails and painful corn debrided. Return in 3 months or as needed.

## 2013-08-30 ENCOUNTER — Telehealth: Payer: Self-pay

## 2013-08-30 NOTE — Telephone Encounter (Signed)
Medication and allergies:  Reviewed and updated  90 day supply/mail order: na Local pharmacy: [facility]   Immunizations due:  Flu, shingles  A/P:   No changes to FH, PSH or Personal Hx Flu vaccine--did not get this season Tdap--10/2011 PNA--2010, 2004 Shingles--due MMG--11/2011--neg bi rads Bone Density--11/2011--osteopenia CCS--02/2005--in [place]--neg per patient  To Discuss with Provider: Experiencing elevated BP Derm referral Refill Zetia

## 2013-09-02 ENCOUNTER — Encounter: Payer: Self-pay | Admitting: Internal Medicine

## 2013-09-02 ENCOUNTER — Ambulatory Visit (INDEPENDENT_AMBULATORY_CARE_PROVIDER_SITE_OTHER): Payer: Medicare Other | Admitting: Internal Medicine

## 2013-09-02 VITALS — BP 141/75 | HR 75 | Temp 98.2°F | Wt 154.0 lb

## 2013-09-02 DIAGNOSIS — I10 Essential (primary) hypertension: Secondary | ICD-10-CM

## 2013-09-02 DIAGNOSIS — Z0000 Encounter for general adult medical examination without abnormal findings: Secondary | ICD-10-CM | POA: Insufficient documentation

## 2013-09-02 DIAGNOSIS — C44791 Other specified malignant neoplasm of skin of unspecified lower limb, including hip: Secondary | ICD-10-CM

## 2013-09-02 DIAGNOSIS — M810 Age-related osteoporosis without current pathological fracture: Secondary | ICD-10-CM

## 2013-09-02 DIAGNOSIS — Z23 Encounter for immunization: Secondary | ICD-10-CM

## 2013-09-02 DIAGNOSIS — F209 Schizophrenia, unspecified: Secondary | ICD-10-CM

## 2013-09-02 LAB — COMPREHENSIVE METABOLIC PANEL
ALT: 21 U/L (ref 0–35)
AST: 25 U/L (ref 0–37)
Albumin: 4.5 g/dL (ref 3.5–5.2)
Alkaline Phosphatase: 53 U/L (ref 39–117)
BUN: 21 mg/dL (ref 6–23)
CALCIUM: 9.6 mg/dL (ref 8.4–10.5)
CHLORIDE: 106 meq/L (ref 96–112)
CO2: 26 mEq/L (ref 19–32)
CREATININE: 1 mg/dL (ref 0.4–1.2)
GFR: 58.88 mL/min — AB (ref >60.00)
GLUCOSE: 90 mg/dL (ref 70–99)
Potassium: 4 mEq/L (ref 3.5–5.1)
Sodium: 142 mEq/L (ref 135–145)
Total Bilirubin: 0.4 mg/dL (ref 0.3–1.2)
Total Protein: 7.6 g/dL (ref 6.0–8.3)

## 2013-09-02 LAB — LIPID PANEL
CHOL/HDL RATIO: 3
CHOLESTEROL: 236 mg/dL — AB (ref 0–200)
HDL: 83.4 mg/dL (ref >39.00)
LDL CALC: 135 mg/dL — AB (ref 0–99)
TRIGLYCERIDES: 88 mg/dL (ref 0.0–149.0)
VLDL: 17.6 mg/dL (ref 0.0–40.0)

## 2013-09-02 LAB — CBC WITH DIFFERENTIAL/PLATELET
BASOS PCT: 0.6 % (ref 0.0–3.0)
Basophils Absolute: 0 10*3/uL (ref 0.0–0.1)
EOS PCT: 5.2 % — AB (ref 0.0–5.0)
Eosinophils Absolute: 0.2 10*3/uL (ref 0.0–0.7)
HCT: 42.6 % (ref 36.0–46.0)
HEMOGLOBIN: 14.1 g/dL (ref 12.0–15.0)
LYMPHS PCT: 32.6 % (ref 12.0–46.0)
Lymphs Abs: 1.5 10*3/uL (ref 0.7–4.0)
MCHC: 33 g/dL (ref 30.0–36.0)
MCV: 97.3 fl (ref 78.0–100.0)
MONOS PCT: 9.2 % (ref 3.0–12.0)
Monocytes Absolute: 0.4 10*3/uL (ref 0.1–1.0)
NEUTROS ABS: 2.4 10*3/uL (ref 1.4–7.7)
NEUTROS PCT: 52.4 % (ref 43.0–77.0)
Platelets: 288 10*3/uL (ref 150.0–400.0)
RBC: 4.38 Mil/uL (ref 3.87–5.11)
RDW: 12.6 % (ref 11.5–14.6)
WBC: 4.6 10*3/uL (ref 4.5–10.5)

## 2013-09-02 MED ORDER — EZETIMIBE 10 MG PO TABS: 10.0000 mg | ORAL_TABLET | Freq: Every day | ORAL | Status: DC

## 2013-09-02 NOTE — Assessment & Plan Note (Addendum)
Td 10-2011 PNM shot 2004, 2010, provide PNM 13 today zostavax-- 2009 Did not get a flu shot   s/p hyst. due to bleeding, denies h/o abnormal PAPs ----> patient desires no further PAPs MMG neg 11-2011, ordering one  Breast exam today (-)  Cscope ~2005, 2006 per pt, no records ; does not desire further screening, I agree given age   Diet-exercise discssed

## 2013-09-02 NOTE — Assessment & Plan Note (Signed)
On no medications, she seems  stable

## 2013-09-02 NOTE — Patient Instructions (Signed)
Get your blood work before you leave   Take calcium approximately 1 g and vitamin D approximately 1000 units every day  Check the  blood pressure 2 or 3 times a month   be sure it is between 110/60 and 140/85. Ideal blood pressure is 120/80. If it is consistently higher or lower, let me know   Next visit is for routine check up regards your blood pressure   in 6 months  No need to come back fasting Please make an appointment     Fall Prevention and [place] cause injuries and can affect all age groups. It is possible to use preventive measures to significantly decrease the likelihood of falls. There are many simple measures which can make your home safer and prevent falls. OUTDOORS  Repair cracks and edges of walkways and driveways.  Remove high doorway thresholds.  Trim shrubbery on the main path into your home.  Have good outside lighting.  Clear walkways of tools, rocks, debris, and clutter.  Check that handrails are not broken and are securely fastened. Both sides of steps should have handrails.  Have leaves, snow, and ice cleared regularly.  Use sand or salt on walkways during winter months.  In the garage, clean up grease or oil spills. BATHROOM  Install night lights.  Install grab bars by the toilet and in the tub and shower.  Use non-skid mats or decals in the tub or shower.  Place a plastic non-slip stool in the shower to sit on, if needed.  Keep floors dry and clean up all water on the floor immediately.  Remove soap buildup in the tub or shower on a regular basis.  Secure bath mats with non-slip, double-sided rug tape.  Remove throw rugs and tripping hazards from the floors. BEDROOMS  Install night lights.  Make sure a bedside light is easy to reach.  Do not use oversized bedding.  Keep a telephone by your bedside.  Have a firm chair with side arms to use for getting dressed.  Remove throw rugs and tripping hazards from the  floor. KITCHEN  Keep handles on pots and pans turned toward the center of the stove. Use back burners when possible.  Clean up spills quickly and allow time for drying.  Avoid walking on wet floors.  Avoid hot utensils and knives.  Position shelves so they are not too high or low.  Place commonly used objects within easy reach.  If necessary, use a sturdy step stool with a grab bar when reaching.  Keep electrical cables out of the way.  Do not use floor polish or wax that makes floors slippery. If you must use wax, use non-skid floor wax.  Remove throw rugs and tripping hazards from the floor. STAIRWAYS  Never leave objects on stairs.  Place handrails on both sides of stairways and use them. Fix any loose handrails. Make sure handrails on both sides of the stairways are as long as the stairs.  Check carpeting to make sure it is firmly attached along stairs. Make repairs to worn or loose carpet promptly.  Avoid placing throw rugs at the top or bottom of stairways, or properly secure the rug with carpet tape to prevent slippage. Get rid of throw rugs, if possible.  Have an electrician put in a light switch at the top and bottom of the stairs. OTHER FALL PREVENTION TIPS  Wear low-heel or rubber-soled shoes that are supportive and fit well. Wear closed toe shoes.  When using  a stepladder, make sure it is fully opened and both spreaders are firmly locked. Do not climb a closed stepladder.  Add color or contrast paint or tape to grab bars and handrails in your home. Place contrasting color strips on first and last steps.  Learn and use mobility aids as needed. Install an electrical emergency response system.  Turn on lights to avoid dark areas. Replace light bulbs that burn out immediately. Get light switches that glow.  Arrange furniture to create clear pathways. Keep furniture in the same place.  Firmly attach carpet with non-skid or double-sided tape.  Eliminate uneven  floor surfaces.  Select a carpet pattern that does not visually hide the edge of steps.  Be aware of all pets. OTHER HOME SAFETY TIPS  Set the water temperature for 120 F (48.8 C).  Keep emergency numbers on or near the telephone.  Keep smoke detectors on every level of the home and near sleeping areas. Document Released: 06/03/2002 Document Revised: 12/13/2011 Document Reviewed: 09/02/2011 [facility] Patient Information 2014 [place].

## 2013-09-02 NOTE — Assessment & Plan Note (Addendum)
Bone density 08-2008--- osteoporosis Bone density test 11/2011 ordered by  Dr. Amalia Hailey: Osteopenia. Recommend to continue to be active, take calcium OTC Ca and  vitamin D

## 2013-09-02 NOTE — Assessment & Plan Note (Signed)
On zetia, labs

## 2013-09-02 NOTE — Assessment & Plan Note (Signed)
Refer to dermatology 

## 2013-09-02 NOTE — Assessment & Plan Note (Signed)
Blood pressure today very good, Labs. Recommend to monitor her BP from time to time

## 2013-09-02 NOTE — Progress Notes (Signed)
Subjective:    Patient ID: Mikayla Marks, female    DOB: [birth year], 80 y.o.   MRN: [id number]  DOS:  09/02/2013 Type of  visit: Here for Medicare AWV: 1. Risk factors based on Mikayla Marks, S, F history: reviewed 2. Physical Activities:  Very active, walks daily, [facility] 3. Depression/mood: neg screening (has a h/o mental issues)  4. Hearing:  No problemss noted or reported  5. ADL's:  Independent , drives  6. Fall Risk: no recent falls , precautions discussed  7. home Safety: does feel safe at home  8. Height, weight, & visual acuity: see VS, normal vision, sees eye MD q year 9. Counseling: provided 10. Labs ordered based on risk factors: if needed  11. Referral Coordination: if needed 12. Care Plan, see assessment and plan  13. Cognitive Assessment: motor skills appropriate. Has schizophrenia   In addition, today we discussed the following: HTN-- BP was elevated when she saw Dr Kellie Simmering, no recent amb BPs High chol-- needs RF on Zetia H/o SCC-- has areas of scalling-itching, requests derm referral  ROS No  CP, SOB No palpitations, no lower extremity edema Denies  nausea, vomiting diarrhea No abdominal pain Denies  blood in the stools No recent GERD  Sx. No dysphagia or odynophagia (-) cough, sputum production (-) wheezing, chest congestion No dysuria, gross hematuria, difficulty urinating  No vaginal discharge or bleed, genital rash      Mikayla Medical History  Diagnosis Date  . HYPERTENSION   . Hyperlipidemia   . Schizoaffective disorder   . Osteoporosis   . SCC (squamous cell carcinoma)     in situ R pretibial area    Mikayla Surgical History  Procedure Laterality Date  . Cataract extraction    . Cholecystectomy    . Partial hysterectomy      due to bleeding  . Tonsillectomy    . Breast biopsy      benign tumor  . Laser ablation Left     left LE   . Leg surgery Right 10/2012    Ruptured Veins    History   Social History  . Marital Status: Single    Spouse Name: N/A    Number of Children: 3  . Years of Education: N/A   Occupational History  . not working    Social History Main Topics  . Smoking status: Never Smoker   . Smokeless tobacco: Never Used  . Alcohol Use: No  . Drug Use: No  . Sexual Activity: Not on file   Other Topics Concern  . Not on file   Social History Narrative   Daughter lives with her [name])     still drives   widow   Brother [name] in [place], lives 5 min away [phone]           Family History  Problem Relation Age of Onset  . Colon cancer Neg Hx   . Breast cancer Neg Hx   . CAD Neg Hx        Medication List       This list is accurate as of: 09/02/13 11:30 AM.  Always use your most recent med list.               CALTRATE 600 PO  Take by mouth.     cycloSPORINE 0.05 % ophthalmic emulsion  Commonly known as:  RESTASIS  Place 1 drop into both eyes 2 (two) times daily.     multivitamin  with minerals tablet  Take 1 tablet by mouth daily.     ZETIA 10 MG tablet  Generic drug:  ezetimibe           Objective:   Physical Exam BP 141/75  Pulse 75  Temp(Src) 98.2 F (36.8 C)  Wt 154 lb (69.854 kg)  SpO2 93%  General -- alert, well-developed, NAD.  Neck --no thyromegaly  Breast-- no dominant mass, skin and nipples normal to inspection on palpation, axillary areas without mass or lymphadenopathy Lungs -- normal respiratory effort, no intercostal retractions, no accessory muscle use, and normal breath sounds.  Heart-- normal rate, regular rhythm, no murmur.  Abdomen-- Not distended, good bowel sounds,soft, non-tender. Extremities-- no pretibial edema bilaterally  Neurologic--  alert & oriented X3. Speech normal, gait normal, strength normal in all extremities.  Psych--   No anxious or depressed appearing.     Assessment & Plan:

## 2013-09-03 ENCOUNTER — Telehealth: Payer: Self-pay | Admitting: Internal Medicine

## 2013-09-03 LAB — TSH: TSH: 1.44 u[IU]/mL (ref 0.35–5.50)

## 2013-09-03 NOTE — Telephone Encounter (Signed)
Relevant patient education mailed to patient.  

## 2013-09-06 ENCOUNTER — Encounter: Payer: Self-pay | Admitting: *Deleted

## 2013-09-11 ENCOUNTER — Ambulatory Visit (INDEPENDENT_AMBULATORY_CARE_PROVIDER_SITE_OTHER): Payer: Medicare Other | Admitting: Podiatry

## 2013-09-11 ENCOUNTER — Encounter: Payer: Self-pay | Admitting: Podiatry

## 2013-09-11 VITALS — BP 162/65 | HR 57

## 2013-09-11 DIAGNOSIS — L84 Corns and callosities: Secondary | ICD-10-CM

## 2013-09-11 DIAGNOSIS — M25579 Pain in unspecified ankle and joints of unspecified foot: Secondary | ICD-10-CM

## 2013-09-11 DIAGNOSIS — M25572 Pain in left ankle and joints of left foot: Secondary | ICD-10-CM

## 2013-09-11 NOTE — Patient Instructions (Signed)
Seen for painful corn. All nails and corns debrided. Return in 3 months or as needed.

## 2013-09-11 NOTE — Progress Notes (Signed)
Subjective:  80 y.o. year old female patient presents requesting corn on 5th digit left trimmed.   Objective: Dermatologic: Hypertrophic nails x 10.  Digital corn 5th left. No open lesions.  Vascular: Posterior tibial pulses are palpable on both feet, dorsalis pedis pulses are not palpable on both feet.  Positive of severe varicose vein over dorsum right.  Orthopedic: Contracted lesser digits bilateral.  Neurologic: All epicritic and tactile sensations grossly intact.   Assessment:  Dystrophic mycotic nails x 10.  Digital corn 5th left.  Varicose vein dorsum right.   Treatment: All mycotic nails, corns, calluses debrided.  Patient will take care of the varicose vein on right before she goes on her vacation.  Return in 3 months or as needed.  

## 2013-09-12 DIAGNOSIS — L820 Inflamed seborrheic keratosis: Secondary | ICD-10-CM | POA: Diagnosis not present

## 2013-09-12 DIAGNOSIS — D1801 Hemangioma of skin and subcutaneous tissue: Secondary | ICD-10-CM | POA: Diagnosis not present

## 2013-09-12 DIAGNOSIS — L538 Other specified erythematous conditions: Secondary | ICD-10-CM | POA: Diagnosis not present

## 2013-09-12 DIAGNOSIS — L821 Other seborrheic keratosis: Secondary | ICD-10-CM | POA: Diagnosis not present

## 2013-09-12 DIAGNOSIS — Z85828 Personal history of other malignant neoplasm of skin: Secondary | ICD-10-CM | POA: Diagnosis not present

## 2014-02-04 ENCOUNTER — Telehealth: Payer: Self-pay | Admitting: Internal Medicine

## 2014-02-04 NOTE — Telephone Encounter (Signed)
Left a message for call back.  

## 2014-02-04 NOTE — Telephone Encounter (Signed)
Patient Information:  Caller Name: Mikayla Marks  Phone: [phone]  Patient: Mikayla Marks  Gender: Female  DOB: 1927-07-02  Age: 80 Years  PCP: Kathlene November  Office Follow Up:  Does the office need to follow up with this patient?: Yes  Instructions For The Office: She needs written excuse to get out of gym membership.  RN Note:  She refused any triage offered and asked for the Dr. to send a note to her home that she can bring into the gym. She wants to end her membership due to not having the energy to go there anymore. She needs the note to excuse her from the gym. Please send. She refused any triage offered by RN/CAN.  Symptoms  Reason For Call & Symptoms: Needs a written note from the Dr. sent to her house  Reviewed Health History In EMR: N/A  Reviewed Medications In EMR: N/A  Reviewed Allergies In EMR: N/A  Reviewed Surgeries / Procedures: N/A  Date of Onset of Symptoms: 02/04/2014  Guideline(s) Used:  No Protocol Available - Information Only  Disposition Per Guideline:   Discuss with PCP and Callback by Nurse Today  Reason For Disposition Reached:   Nursing judgment  Advice Given:  N/A  Patient Will Follow Care Advice:  YES

## 2014-02-04 NOTE — Telephone Encounter (Signed)
Spoke with daughter who stated that the call was simply to obtain a letter to cancel gym membership at [employer].  Daughter stated that a letter was required.  Daughter stated that mother is 80 years old, but has been experiencing ongoing fatigue.  States Dr. Larose Kells is aware of this.  She also requested a follow up appointment be scheduled with Dr. Larose Kells next week.  Appointment scheduled for Tuesday, 02/11/14 @ 1:15 pm.    Faythe Ghee to write letter?

## 2014-02-04 NOTE — Telephone Encounter (Signed)
Please print a  a letter: To Whom It May Concern,  Mikayla Marks is a patient of mine, she is 77 and frequently feels fatigued. Is unlikely that she can take advantage of a gym membership under the circumstances.

## 2014-02-05 NOTE — Telephone Encounter (Signed)
Letter typed and placed in Dr. Ethel Rana red folder for review and signature.

## 2014-02-05 NOTE — Telephone Encounter (Signed)
Called Pt's daughter and LMOM to call back about letter.

## 2014-02-06 DIAGNOSIS — I6529 Occlusion and stenosis of unspecified carotid artery: Secondary | ICD-10-CM | POA: Insufficient documentation

## 2014-02-06 DIAGNOSIS — N189 Chronic kidney disease, unspecified: Secondary | ICD-10-CM | POA: Insufficient documentation

## 2014-02-11 ENCOUNTER — Encounter: Payer: Self-pay | Admitting: Internal Medicine

## 2014-02-11 ENCOUNTER — Ambulatory Visit (INDEPENDENT_AMBULATORY_CARE_PROVIDER_SITE_OTHER): Payer: Medicare Other | Admitting: Internal Medicine

## 2014-02-11 VITALS — BP 143/66 | HR 69 | Temp 97.5°F | Wt 157.0 lb

## 2014-02-11 DIAGNOSIS — R5382 Chronic fatigue, unspecified: Secondary | ICD-10-CM

## 2014-02-11 DIAGNOSIS — R5381 Other malaise: Secondary | ICD-10-CM

## 2014-02-11 DIAGNOSIS — R5383 Other fatigue: Secondary | ICD-10-CM | POA: Diagnosis not present

## 2014-02-11 NOTE — Assessment & Plan Note (Addendum)
Fatigue to lack of sleep per patient. Physical exam is benign, labs this year where satisfactory. Plan:  We will discuss with his brother to be sure we are not missing any important information otherwise recommend observation and  reassess In one to 2 months when she comes back for a physical. Also requests a disabled parking permit and she is being 86 and fatigue I think is reasonable to provide . Addendum: I spoke with the patient's brother who confirms that physically she is doing well, she is tired likely because she does not   sleep much at night

## 2014-02-11 NOTE — Progress Notes (Signed)
   Subjective:    Patient ID: Mikayla Marks, female    DOB: Sep 18, 1927, 80 y.o.   MRN: [id number]  DOS:  02/11/2014 Type of visit - description: acute History: Chief complaint today is fatigue , " there is a lot of military activity in my neighborhood and we can't sleep". Also because of fatigue she has a difficult time walking, Denies chest pain or dyspnea on exertion she simply feels fatigued request of disabled parking permit. Otherwise feeling well.   ROS Denies chest pain, some lower extremity edema No nausea, vomiting, diarrhea or blood in the stools  Past Medical History  Diagnosis Date  . HYPERTENSION   . Hyperlipidemia   . Schizoaffective disorder   . Osteoporosis   . SCC (squamous cell carcinoma)     in situ R pretibial area    Past Surgical History  Procedure Laterality Date  . Cataract extraction    . Cholecystectomy    . Partial hysterectomy      due to bleeding  . Tonsillectomy    . Breast biopsy      benign tumor  . Laser ablation Left     left LE   . Leg surgery Right 10/2012    Ruptured Veins    History   Social History  . Marital Status: Single    Spouse Name: N/A    Number of Children: 3  . Years of Education: N/A   Occupational History  . not working    Social History Main Topics  . Smoking status: Never Smoker   . Smokeless tobacco: Never Used  . Alcohol Use: No  . Drug Use: No  . Sexual Activity: Not on file   Other Topics Concern  . Not on file   Social History Narrative   Daughter lives with her Mikayla Marks)     still drives   widow   Brother Mikayla Marks in [place], lives 5 min away [phone]              Medication List       This list is accurate as of: 02/11/14  1:45 PM.  Always use your most recent med list.               CALTRATE 600 PO  Take by mouth.     cycloSPORINE 0.05 % ophthalmic emulsion  Commonly known as:  RESTASIS  Place 1 drop into both eyes 2 (two) times daily.     ezetimibe 10 MG tablet    Commonly known as:  ZETIA  Take 1 tablet (10 mg total) by mouth at bedtime.     Fish Oil 1000 MG Caps  Take by mouth daily.     multivitamin with minerals tablet  Take 1 tablet by mouth daily.           Objective:   Physical Exam BP 143/66  Pulse 69  Temp(Src) 97.5 F (36.4 C) (Oral)  Wt 157 lb (71.215 kg)  SpO2 96% General -- alert, well-developed, NAD.  Neck --no JVD at 45 HEENT-- Not pale. Lungs -- normal respiratory effort, no intercostal retractions, no accessory muscle use, and normal breath sounds.  Heart-- normal rate, regular rhythm, no murmur.  Extremities-- +/+++  pretibial edema bilaterally  Neurologic--  History of schizophrenia, delusional Psych--   No anxious or depressed appearing.     Assessment & Plan:

## 2014-02-11 NOTE — Progress Notes (Signed)
Pre-visit discussion using our clinic review tool. No additional management support is needed unless otherwise documented below in the visit note.  

## 2014-02-11 NOTE — Patient Instructions (Addendum)
Please call  If you get worse, schedule a physical exam in 6-8 weeks

## 2014-04-01 ENCOUNTER — Ambulatory Visit (INDEPENDENT_AMBULATORY_CARE_PROVIDER_SITE_OTHER): Payer: Medicare Other | Admitting: Internal Medicine

## 2014-04-01 ENCOUNTER — Encounter: Payer: Self-pay | Admitting: Internal Medicine

## 2014-04-01 VITALS — BP 157/77 | HR 72 | Temp 97.6°F | Wt 159.2 lb

## 2014-04-01 DIAGNOSIS — Z23 Encounter for immunization: Secondary | ICD-10-CM | POA: Diagnosis not present

## 2014-04-01 DIAGNOSIS — F203 Undifferentiated schizophrenia: Secondary | ICD-10-CM | POA: Diagnosis not present

## 2014-04-01 NOTE — Progress Notes (Signed)
Pre visit review using our clinic review tool, if applicable. No additional management support is needed unless otherwise documented below in the visit note. 

## 2014-04-01 NOTE — Patient Instructions (Signed)
Please come back to the office by 08-2014 for a physical exam. Come back fasting

## 2014-04-01 NOTE — Progress Notes (Signed)
   Subjective:    Patient ID: Mikayla Marks, female    DOB: 11/12/1926, 80 y.o.   MRN: [id number]  DOS:  04/01/2014 Type of visit - description : f/u Interval history: Feeling about the same, again complains that there is a lot of " things going on at  her neighborhood including poisonous gases and needles". Can't sleep well. Feeling really fatigued.     Past Medical History  Diagnosis Date  . HYPERTENSION   . Hyperlipidemia   . Schizoaffective disorder   . Osteoporosis   . SCC (squamous cell carcinoma)     in situ R pretibial area    Past Surgical History  Procedure Laterality Date  . Cataract extraction    . Cholecystectomy    . Partial hysterectomy      due to bleeding  . Tonsillectomy    . Breast biopsy      benign tumor  . Laser ablation Left     left LE   . Leg surgery Right 10/2012    Ruptured Veins    History   Social History  . Marital Status: Single    Spouse Name: N/A    Number of Children: 3  . Years of Education: N/A   Occupational History  . not working    Social History Main Topics  . Smoking status: Never Smoker   . Smokeless tobacco: Never Used  . Alcohol Use: No  . Drug Use: No  . Sexual Activity: Not on file   Other Topics Concern  . Not on file   Social History Narrative   Daughter lives with her Mikayla Marks)     still drives   widow   Brother Mikayla Marks in [place], lives 5 min away [phone]              Medication List       This list is accurate as of: 04/01/14 11:37 AM.  Always use your most recent med list.               CALTRATE 600 PO  Take by mouth.     cycloSPORINE 0.05 % ophthalmic emulsion  Commonly known as:  RESTASIS  Place 1 drop into both eyes 2 (two) times daily.     ezetimibe 10 MG tablet  Commonly known as:  ZETIA  Take 1 tablet (10 mg total) by mouth at bedtime.     Fish Oil 1000 MG Caps  Take by mouth daily.     multivitamin with minerals tablet  Take 1 tablet by mouth daily.             Objective:   Physical Exam BP 157/77  Pulse 72  Temp(Src) 97.6 F (36.4 C) (Oral)  Wt 159 lb 4 oz (72.235 kg)  SpO2 99% General -- alert, well-developed, NAD.   Neurologic--  alert & oriented X3.  Language content consistent with schizophrenia. Psych--  No anxious or depressed appearing.     Assessment & Plan:   Flu shot today

## 2014-04-01 NOTE — Assessment & Plan Note (Addendum)
Continue difficulty sleeping, I contacted the patient's brother who I known, we both think she would benefit from a low dose of sleeping medication, I like to give the prescription to somebody else who could  supervise the meds. Richard told me the right person is the pt' daughter who lives with her, she is responsible and will be able to supervise meds.called her today, unable to communicate, will try again. addendum, unable to communicate with them after several attempts. Will try at  the next office visit.

## 2014-05-12 DIAGNOSIS — F200 Paranoid schizophrenia: Secondary | ICD-10-CM | POA: Diagnosis not present

## 2014-05-12 DIAGNOSIS — N183 Chronic kidney disease, stage 3 (moderate): Secondary | ICD-10-CM | POA: Diagnosis not present

## 2014-05-12 DIAGNOSIS — I10 Essential (primary) hypertension: Secondary | ICD-10-CM | POA: Diagnosis not present

## 2014-05-12 DIAGNOSIS — E785 Hyperlipidemia, unspecified: Secondary | ICD-10-CM | POA: Diagnosis not present

## 2014-05-19 ENCOUNTER — Encounter: Payer: Self-pay | Admitting: Podiatry

## 2014-05-19 ENCOUNTER — Ambulatory Visit (INDEPENDENT_AMBULATORY_CARE_PROVIDER_SITE_OTHER): Payer: Medicare Other | Admitting: Podiatry

## 2014-05-19 VITALS — BP 174/76 | HR 78

## 2014-05-19 DIAGNOSIS — M79606 Pain in leg, unspecified: Secondary | ICD-10-CM | POA: Insufficient documentation

## 2014-05-19 DIAGNOSIS — B351 Tinea unguium: Secondary | ICD-10-CM | POA: Insufficient documentation

## 2014-05-19 DIAGNOSIS — M79605 Pain in left leg: Secondary | ICD-10-CM

## 2014-05-19 NOTE — Patient Instructions (Signed)
Seen for hypertrophic nails. All nails debrided. Return in 3 months or as needed.  

## 2014-05-19 NOTE — Progress Notes (Signed)
Subjective:  80 y.o. year old female patient presents requesting toe nails and corn on 5th digit left trimmed. It is painful and covered with band aid.   Objective: Dermatologic: Hypertrophic nails x 10.  Digital corn 5th left. No infection noted.  Vascular: Posterior tibial pulses are palpable on both feet, dorsalis pedis pulses are not palpable on both feet.  Positive of severe varicose vein over dorsum right.  Orthopedic: Contracted lesser digits bilateral.  Neurologic: All epicritic and tactile sensations grossly intact.   Assessment:  Dystrophic mycotic nails x 10.  Digital corn 5th left.  Varicose vein dorsum right.   Treatment: All mycotic nails, corns, calluses debrided.  Patient will take care of the varicose vein on right before she goes on her vacation.  Return in 3 months or as needed

## 2014-06-04 DIAGNOSIS — I8310 Varicose veins of unspecified lower extremity with inflammation: Secondary | ICD-10-CM | POA: Diagnosis not present

## 2014-06-04 DIAGNOSIS — L821 Other seborrheic keratosis: Secondary | ICD-10-CM | POA: Diagnosis not present

## 2014-06-04 DIAGNOSIS — I868 Varicose veins of other specified sites: Secondary | ICD-10-CM | POA: Diagnosis not present

## 2014-06-05 ENCOUNTER — Other Ambulatory Visit: Payer: Self-pay | Admitting: *Deleted

## 2014-06-05 DIAGNOSIS — I83893 Varicose veins of bilateral lower extremities with other complications: Secondary | ICD-10-CM

## 2014-06-06 ENCOUNTER — Encounter: Payer: Self-pay | Admitting: Vascular Surgery

## 2014-06-09 ENCOUNTER — Telehealth: Payer: Self-pay | Admitting: *Deleted

## 2014-06-09 ENCOUNTER — Encounter (HOSPITAL_COMMUNITY): Payer: Medicare Other

## 2014-06-09 ENCOUNTER — Ambulatory Visit: Payer: Medicare Other | Admitting: Vascular Surgery
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
## 2014-06-24 ENCOUNTER — Other Ambulatory Visit: Payer: Self-pay

## 2014-06-24 DIAGNOSIS — I10 Essential (primary) hypertension: Secondary | ICD-10-CM

## 2014-06-24 MED ORDER — EZETIMIBE 10 MG PO TABS: 10.0000 mg | ORAL_TABLET | Freq: Every day | ORAL | Status: DC

## 2014-08-01 DIAGNOSIS — H0015 Chalazion left lower eyelid: Secondary | ICD-10-CM | POA: Diagnosis not present

## 2014-08-04 ENCOUNTER — Encounter: Payer: Self-pay | Admitting: Podiatry

## 2014-08-04 ENCOUNTER — Ambulatory Visit (INDEPENDENT_AMBULATORY_CARE_PROVIDER_SITE_OTHER): Payer: Medicare Other | Admitting: Podiatry

## 2014-08-04 VITALS — BP 161/67 | HR 73

## 2014-08-04 DIAGNOSIS — M79606 Pain in leg, unspecified: Secondary | ICD-10-CM

## 2014-08-04 DIAGNOSIS — B351 Tinea unguium: Secondary | ICD-10-CM
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
## 2014-08-04 NOTE — Patient Instructions (Signed)
Seen for hypertrophic nails and corns. All nails debrided and padded 5th digit left. Return in 3 months or as needed.

## 2014-08-08 DIAGNOSIS — H0015 Chalazion left lower eyelid: Secondary | ICD-10-CM | POA: Diagnosis not present

## 2014-08-21 ENCOUNTER — Telehealth: Payer: Self-pay | Admitting: Internal Medicine

## 2014-08-21 ENCOUNTER — Telehealth: Payer: Self-pay | Admitting: *Deleted
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
## 2014-08-21 NOTE — Telephone Encounter (Signed)
Pt called saying she wants to see Dr. Kellie Simmering again because bothe her legs are swelling. I called and reminded her that with bilateral deep reflux it is normal to see swelling and discussed the use of her thigh high compression stockings as well as elevation. She said the swelling improves when she walks. I told her that is normal too. I said we would be happy to do a reflux study of both legs, but that the test is expensive so I thought she should try the means mentioned earlier before scheduling that test. She expressed understanding and we will follow her prn.

## 2014-08-22 ENCOUNTER — Emergency Department (HOSPITAL_BASED_OUTPATIENT_CLINIC_OR_DEPARTMENT_OTHER): Payer: Medicare Other

## 2014-08-22 ENCOUNTER — Emergency Department (HOSPITAL_BASED_OUTPATIENT_CLINIC_OR_DEPARTMENT_OTHER)
Admission: EM | Admit: 2014-08-22 | Discharge: 2014-08-22 | Disposition: A | Discharge status: Home / Self Care | Payer: Medicare Other | Attending: Emergency Medicine | Admitting: Emergency Medicine

## 2014-08-22 ENCOUNTER — Encounter (HOSPITAL_BASED_OUTPATIENT_CLINIC_OR_DEPARTMENT_OTHER): Payer: Self-pay

## 2014-08-22 DIAGNOSIS — M810 Age-related osteoporosis without current pathological fracture: Secondary | ICD-10-CM | POA: Insufficient documentation

## 2014-08-22 DIAGNOSIS — Z8659 Personal history of other mental and behavioral disorders: Secondary | ICD-10-CM | POA: Insufficient documentation

## 2014-08-22 DIAGNOSIS — M7989 Other specified soft tissue disorders: Secondary | ICD-10-CM

## 2014-08-22 DIAGNOSIS — L03115 Cellulitis of right lower limb: Secondary | ICD-10-CM | POA: Insufficient documentation

## 2014-08-22 DIAGNOSIS — I10 Essential (primary) hypertension: Secondary | ICD-10-CM

## 2014-08-22 DIAGNOSIS — R2240 Localized swelling, mass and lump, unspecified lower limb: Secondary | ICD-10-CM | POA: Diagnosis not present

## 2014-08-22 DIAGNOSIS — R079 Chest pain, unspecified: Secondary | ICD-10-CM | POA: Diagnosis not present

## 2014-08-22 DIAGNOSIS — Z880 Allergy status to penicillin: Secondary | ICD-10-CM | POA: Insufficient documentation

## 2014-08-22 DIAGNOSIS — J9811 Atelectasis: Secondary | ICD-10-CM | POA: Diagnosis not present

## 2014-08-22 DIAGNOSIS — J439 Emphysema, unspecified: Secondary | ICD-10-CM | POA: Diagnosis not present

## 2014-08-22 DIAGNOSIS — E785 Hyperlipidemia, unspecified: Secondary | ICD-10-CM | POA: Diagnosis not present

## 2014-08-22 DIAGNOSIS — R0789 Other chest pain: Secondary | ICD-10-CM | POA: Diagnosis not present

## 2014-08-22 DIAGNOSIS — Z85828 Personal history of other malignant neoplasm of skin: Secondary | ICD-10-CM | POA: Diagnosis not present

## 2014-08-22 DIAGNOSIS — R0602 Shortness of breath: Secondary | ICD-10-CM | POA: Diagnosis not present

## 2014-08-22 DIAGNOSIS — L03116 Cellulitis of left lower limb: Secondary | ICD-10-CM | POA: Diagnosis not present

## 2014-08-22 LAB — BASIC METABOLIC PANEL
Anion gap: 4 — ABNORMAL LOW (ref 5–15)
BUN: 23 mg/dL (ref 6–23)
CHLORIDE: 106 mmol/L (ref 96–112)
CO2: 26 mmol/L (ref 19–32)
Calcium: 8.8 mg/dL (ref 8.4–10.5)
Creatinine, Ser: 0.96 mg/dL (ref 0.50–1.10)
GFR calc non Af Amer: 53 mL/min — ABNORMAL LOW (ref >90)
GFR, EST AFRICAN AMERICAN: 61 mL/min — AB (ref >90)
Glucose, Bld: 112 mg/dL — ABNORMAL HIGH (ref 70–99)
Potassium: 3.8 mmol/L (ref 3.5–5.1)
Sodium: 136 mmol/L (ref 135–145)

## 2014-08-22 LAB — CBC WITH DIFFERENTIAL/PLATELET
BASOS ABS: 0 10*3/uL (ref 0.0–0.1)
Basophils Relative: 0 % (ref 0–1)
EOS PCT: 6 % — AB (ref 0–5)
Eosinophils Absolute: 0.3 10*3/uL (ref 0.0–0.7)
HEMATOCRIT: 40.3 % (ref 36.0–46.0)
Hemoglobin: 13.3 g/dL (ref 12.0–15.0)
Lymphocytes Relative: 36 % (ref 12–46)
Lymphs Abs: 2.1 10*3/uL (ref 0.7–4.0)
MCH: 32.2 pg (ref 26.0–34.0)
MCHC: 33 g/dL (ref 30.0–36.0)
MCV: 97.6 fL (ref 78.0–100.0)
MONOS PCT: 16 % — AB (ref 3–12)
Monocytes Absolute: 1 10*3/uL (ref 0.1–1.0)
Neutro Abs: 2.6 10*3/uL (ref 1.7–7.7)
Neutrophils Relative %: 42 % — ABNORMAL LOW (ref 43–77)
PLATELETS: 266 10*3/uL (ref 150–400)
RBC: 4.13 MIL/uL (ref 3.87–5.11)
RDW: 12.3 % (ref 11.5–15.5)
WBC: 6 10*3/uL (ref 4.0–10.5)

## 2014-08-22 LAB — D-DIMER, QUANTITATIVE (NOT AT ARMC): D DIMER QUANT: 2.04 ug{FEU}/mL — AB (ref 0.00–0.48)

## 2014-08-22 LAB — TROPONIN I

## 2014-08-22 LAB — BRAIN NATRIURETIC PEPTIDE: B Natriuretic Peptide: 32.8 pg/mL (ref 0.0–100.0)

## 2014-08-22 MED ORDER — DOXYCYCLINE HYCLATE 100 MG PO TABS
100.0000 mg | ORAL_TABLET | Freq: Once | ORAL | Status: AC
  Administered 2014-08-22: 100 mg via ORAL
  Filled 2014-08-22: qty 1

## 2014-08-22 MED ORDER — ASPIRIN 81 MG PO CHEW
324.0000 mg | CHEWABLE_TABLET | Freq: Once | ORAL | Status: AC
  Administered 2014-08-22: 324 mg via ORAL
  Filled 2014-08-22: qty 4

## 2014-08-22 MED ORDER — DOXYCYCLINE HYCLATE 100 MG PO CAPS: 100.0000 mg | ORAL_CAPSULE | Freq: Two times a day (BID) | ORAL | Status: DC

## 2014-08-22 MED ORDER — IOHEXOL 350 MG/ML SOLN
100.0000 mL | Freq: Once | INTRAVENOUS | Status: AC | PRN
  Administered 2014-08-22: 100 mL via INTRAVENOUS

## 2014-08-22 MED ORDER — SODIUM CHLORIDE 0.9 % IV SOLN: INTRAVENOUS | Status: DC

## 2014-08-22 NOTE — ED Notes (Signed)
Attempted IV start x 2 unsuccessful.  

## 2014-08-22 NOTE — Discharge Instructions (Signed)
Take the antibiotic for the cellulitis of both legs. Make an appointment to follow-up with your regular doctor. Recommend keeping a blood pressure log each day and reporting the findings to your primary care doctor. Return for any new or worse symptoms. Workup for the chest pain and shortness of breath was negative.

## 2014-08-22 NOTE — ED Notes (Signed)
MD at bedside. 

## 2014-08-22 NOTE — ED Notes (Signed)
I assisted patient Mikayla Marks to CT with patient. Patient was able to stand without assistance to CT scanner bed and back with no difficulty. I explained possible delays until CT results come back/doctor review etc. Patient back in room with daughter. I took new set of vitals.

## 2014-08-22 NOTE — ED Notes (Signed)
Upon initial registration patients daughter asking this RN "Where is your cardiologist? Do you have one here on staff?" - Explained that [facility] always has a cardiologist "on staff" but there was no cardiologist here in the ED. Patients daughter then stated, "What kind of place is this? What kind of doctor is here?" Informed patients daughter that this was an Emergency Department and we are staffed by Emergency Physicians.

## 2014-08-22 NOTE — ED Notes (Signed)
Patient transported to X-ray 

## 2014-08-22 NOTE — ED Notes (Signed)
Daughter is questioning what I injected into her mothers IV after I flushed the saline lock with saline flush. Reassured it was safe.

## 2014-08-22 NOTE — ED Notes (Signed)
Patient returned from CT

## 2014-08-22 NOTE — Telephone Encounter (Signed)
Left a message for call back.  

## 2014-08-22 NOTE — ED Notes (Signed)
Patient transported to CT, Shanon Brow EMT will go with pt.  Adam CT tech will not be alone with this pt in the xray department.
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
## 2014-08-22 NOTE — ED Provider Notes (Signed)
CSN: [id number]     Arrival date & time 08/22/14  1720 History   First MD Initiated Contact with Patient 08/22/14 1727     Chief Complaint  Patient presents with  . Chest Pain     (Consider location/radiation/quality/duration/timing/severity/associated sxs/prior Treatment) Patient is a 81 y.o. female presenting with chest pain. The history is provided by the patient and a relative.  Chest Pain Associated symptoms: shortness of breath   Associated symptoms: no abdominal pain, no back pain, no fever, no headache, no nausea and not vomiting   patient and her daughter with several concerns. Patient has had chest pain and a heaviness anterior chest for 1-1/2 weeks associated with some shortness of breath on and off. Shortness of breath been more prominent for the past 3 days. Patient also is concerned about her blood pressure being high. Patient concerned about bilateral leg swelling and redness to her legs. They're also concerned about blueness to her toes. Chest pain is very mild 4 out of 1010 of a heaviness feeling constant nonradiating.  Past Medical History  Diagnosis Date  . HYPERTENSION   . Hyperlipidemia   . Schizoaffective disorder   . Osteoporosis   . SCC (squamous cell carcinoma)     in situ R pretibial area   Past Surgical History  Procedure Laterality Date  . Cataract extraction    . Cholecystectomy    . Partial hysterectomy      due to bleeding  . Tonsillectomy    . Breast biopsy      benign tumor  . Laser ablation Left     left LE   . Leg surgery Right 10/2012    Ruptured Veins   Family History  Problem Relation Age of Onset  . Colon cancer Neg Hx   . Breast cancer Neg Hx   . CAD Neg Hx    History  Substance Use Topics  . Smoking status: Never Smoker   . Smokeless tobacco: Never Used  . Alcohol Use: No   OB History    No data available     Review of Systems  Constitutional: Negative for fever.  HENT: Negative for congestion.   Eyes: Negative for  redness.  Respiratory: Positive for shortness of breath.   Cardiovascular: Positive for chest pain and leg swelling.  Gastrointestinal: Negative for nausea, vomiting and abdominal pain.  Genitourinary: Negative for dysuria.  Musculoskeletal: Negative for back pain.  Skin: Positive for rash.  Neurological: Negative for headaches.  Hematological: Does not bruise/bleed easily.  Psychiatric/Behavioral: Negative for confusion.      Allergies  Bacitracin-polymyxin b and Penicillins  Home Medications   Prior to Admission medications   Medication Sig Start Date End Date Taking? Authorizing Provider  Calcium Carbonate (CALTRATE 600 PO) Take by mouth.    Historical Provider, MD  cycloSPORINE (RESTASIS) 0.05 % ophthalmic emulsion Place 1 drop into both eyes 2 (two) times daily.    Historical Provider, MD  doxycycline (VIBRAMYCIN) 100 MG capsule Take 1 capsule (100 mg total) by mouth 2 (two) times daily. 08/22/14   Fredia Sorrow, MD  ezetimibe (ZETIA) 10 MG tablet Take 1 tablet (10 mg total) by mouth at bedtime. 06/24/14   Colon Branch, MD  losartan-hydrochlorothiazide [facility]) 50-12.5 MG per tablet  07/23/14   Historical Provider, MD  Multiple Vitamins-Minerals (MULTIVITAMIN WITH MINERALS) tablet Take 1 tablet by mouth daily.    Historical Provider, MD  Omega-3 Fatty Acids (FISH OIL) 1000 MG CAPS Take by mouth daily.  Historical Provider, MD   BP 162/66 mmHg  Pulse 75  Temp(Src) 97.6 F (36.4 C) (Oral)  Resp 20  Ht 5\' 5"  (1.651 m)  Wt 165 lb (74.844 kg)  BMI 27.46 kg/m2  SpO2 95% Physical Exam  Constitutional: She is oriented to person, place, and time. She appears well-developed and well-nourished. No distress.  HENT:  Head: Normocephalic and atraumatic.  Eyes: Conjunctivae and EOM are normal. Pupils are equal, round, and reactive to light.  Neck: Normal range of motion.  Cardiovascular: Normal rate, regular rhythm and normal heart sounds.   No murmur heard. Pulmonary/Chest:  Effort normal and breath sounds normal. No respiratory distress. She has no rales.  Abdominal: Soft. Bowel sounds are normal. There is no tenderness.  Musculoskeletal: She exhibits edema.  Bilateral leg swelling. With erythema to the anterior part of both legs. Does extend down into the ankle area. Is warm nontender. There is pitting edema. Patient's Refill to the toes on both sides is 2 seconds. The right foot at the base of the toes has something that looks like some bruising. No evidence of pain or ischemia. No evidence of necrosis. Nontender to palpate.dorsalis pedis pulses 1+ bilaterally.  Neurological: She is oriented to person, place, and time. No cranial nerve deficit. She exhibits normal muscle tone. Coordination normal.  Skin: Skin is warm. There is erythema.  Nursing note and vitals reviewed.   ED Course  Procedures (including critical care time) Labs Review Labs Reviewed  BASIC METABOLIC PANEL - Abnormal; Notable for the following:    Glucose, Bld 112 (*)    GFR calc non Af Amer 53 (*)    GFR calc Af Amer 61 (*)    Anion gap 4 (*)    All other components within normal limits  CBC WITH DIFFERENTIAL/PLATELET - Abnormal; Notable for the following:    Neutrophils Relative % 42 (*)    Monocytes Relative 16 (*)    Eosinophils Relative 6 (*)    All other components within normal limits  D-DIMER, QUANTITATIVE - Abnormal; Notable for the following:    D-Dimer, Quant 2.04 (*)    All other components within normal limits  TROPONIN I  BRAIN NATRIURETIC PEPTIDE   Results for orders placed or performed during the hospital encounter of 08/22/14  Troponin I  Result Value Ref Range   Troponin I <0.03 <0.031 ng/mL  Basic metabolic panel  Result Value Ref Range   Sodium 136 135 - 145 mmol/L   Potassium 3.8 3.5 - 5.1 mmol/L   Chloride 106 96 - 112 mmol/L   CO2 26 19 - 32 mmol/L   Glucose, Bld 112 (H) 70 - 99 mg/dL   BUN 23 6 - 23 mg/dL   Creatinine, Ser 0.96 0.50 - 1.10 mg/dL    Calcium 8.8 8.4 - 10.5 mg/dL   GFR calc non Af Amer 53 (L) >90 mL/min   GFR calc Af Amer 61 (L) >90 mL/min   Anion gap 4 (L) 5 - 15  CBC with Differential/Platelet  Result Value Ref Range   WBC 6.0 4.0 - 10.5 K/uL   RBC 4.13 3.87 - 5.11 MIL/uL   Hemoglobin 13.3 12.0 - 15.0 g/dL   HCT 40.3 36.0 - 46.0 %   MCV 97.6 78.0 - 100.0 fL   MCH 32.2 26.0 - 34.0 pg   MCHC 33.0 30.0 - 36.0 g/dL   RDW 12.3 11.5 - 15.5 %   Platelets 266 150 - 400 K/uL   Neutrophils Relative %  42 (L) 43 - 77 %   Neutro Abs 2.6 1.7 - 7.7 K/uL   Lymphocytes Relative 36 12 - 46 %   Lymphs Abs 2.1 0.7 - 4.0 K/uL   Monocytes Relative 16 (H) 3 - 12 %   Monocytes Absolute 1.0 0.1 - 1.0 K/uL   Eosinophils Relative 6 (H) 0 - 5 %   Eosinophils Absolute 0.3 0.0 - 0.7 K/uL   Basophils Relative 0 0 - 1 %   Basophils Absolute 0.0 0.0 - 0.1 K/uL  Brain natriuretic peptide  Result Value Ref Range   B Natriuretic Peptide 32.8 0.0 - 100.0 pg/mL  D-dimer, quantitative  Result Value Ref Range   D-Dimer, Quant 2.04 (H) 0.00 - 0.48 ug/mL-FEU     Imaging Review Dg Chest 2 View  08/22/2014   CLINICAL DATA:  Left chest pain/ heaviness starting 10 days ago. Nausea. Bruising in the lower extremities.  EXAM: CHEST  2 VIEW  COMPARISON:  None.  FINDINGS: Mild enlargement of the cardiopericardial silhouette. Atherosclerotic calcification of the aortic arch. Linear subsegmental atelectasis in the left lower lobe. Thoracic spondylosis and kyphosis.  IMPRESSION: 1. Mild enlargement of the cardiopericardial silhouette, without edema. 2. Subsegmental atelectasis, left lower lobe.   Electronically Signed   By: Van Clines M.D.   On: 08/22/2014 18:33   Ct Angio Chest Pe W/cm &/or Wo Cm  08/22/2014   CLINICAL DATA:  Cough and shortness of breath for 3 days. Swelling in the legs for 5 months. Weight gain for 2 weeks. Elevated D-dimer.  EXAM: CT ANGIOGRAPHY CHEST WITH CONTRAST  TECHNIQUE: Multidetector CT imaging of the chest was performed  using the standard protocol during bolus administration of intravenous contrast. Multiplanar CT image reconstructions and MIPs were obtained to evaluate the vascular anatomy.  CONTRAST:  126mL OMNIPAQUE IOHEXOL 350 MG/ML SOLN  COMPARISON:  None.  FINDINGS: Technically adequate study with good opacification of the central and segmental pulmonary arteries. No focal filling defects demonstrated. No evidence of significant pulmonary embolus.  Normal heart size. Normal caliber thoracic aorta. No aortic dissection. Great vessel origins are patent. Calcification in the aorta and coronary arteries. Small esophageal hiatal hernia. Esophagus is decompressed. No significant lymphadenopathy in the chest. 14 mm nodule in the left thyroid gland. No further evaluation is necessary.  Dependent atelectasis in the lung bases. Mild emphysematous changes in the lungs. No focal consolidation. Airways appear patent. No pleural effusion. No pneumothorax.  Included portions of the upper abdominal organs are grossly unremarkable. Degenerative changes in the spine. No destructive bone lesions.  Review of the MIP images confirms the above findings.  IMPRESSION: No evidence of significant pulmonary embolus. Atelectasis in the lung bases. No evidence of active pulmonary disease.   Electronically Signed   By: Lucienne Capers M.D.   On: 08/22/2014 20:45     EKG Interpretation   Date/Time:  Friday August 22 2014 17:32:39 EST Ventricular Rate:  94 PR Interval:  160 QRS Duration: 80 QT Interval:  362 QTC Calculation: 452 R Axis:   0 Text Interpretation:  Normal sinus rhythm Low voltage QRS Borderline ECG  Confirmed by Rhylee Nunn  MD, Kim Oki ([id number]) on 08/22/2014 5:46:45 PM      MDM   Final diagnoses:  Chest pain  SOB (shortness of breath)  Leg swelling  Cellulitis of left lower extremity  Cellulitis of right lower extremity  Essential hypertension    Patient presented with several concerns. One was kind of a heaviness on  the chest for  a week and a half. Associated with some mild shortness of breath. Bilateral leg swelling and redness to the anterior part of the legs bilaterally. Also concerned about the blood pressure being elevated. Patient is treated for high blood pressure. Patient does have a primary care doctor. Patient is not followed by cardiology.  Workup EKG without acute changes. Troponin was negative substance the chest pain has been constant unlikely to be an acute cardiac event. Chest x-ray was negative but d-dimer was elevated so CT angios chest was done to rule out pulmonary embolus. That was negative. Also CT angios showed no subtle findings of pulmonary edema or pneumonia. Patient's BNP was not elevated. Patient's blood pressure has been elevated systolically throughout her stay. And follow-up and perhaps additional treatment may be required. Patient will be started on doxycycline first dose given in the emergency department for bilateral cellulitis. Patient will be continued on doxycycline. And will follow-up with her regular doctor. Patient is already on a diuretic but may need additional diuresis due to the leg swelling.   In addition room air pulse ox is been above 95%.  Fredia Sorrow, MD 08/22/14 2139

## 2014-09-09 ENCOUNTER — Other Ambulatory Visit: Payer: Self-pay

## 2014-09-09 ENCOUNTER — Encounter: Payer: Self-pay | Admitting: Internal Medicine

## 2014-09-09 ENCOUNTER — Ambulatory Visit (INDEPENDENT_AMBULATORY_CARE_PROVIDER_SITE_OTHER): Payer: Medicare Other | Admitting: Internal Medicine

## 2014-09-09 ENCOUNTER — Telehealth: Payer: Self-pay | Admitting: Internal Medicine

## 2014-09-09 ENCOUNTER — Ambulatory Visit (HOSPITAL_BASED_OUTPATIENT_CLINIC_OR_DEPARTMENT_OTHER)
Admission: RE | Admit: 2014-09-09 | Discharge: 2014-09-09 | Disposition: A | Discharge status: Home / Self Care | Payer: Medicare Other | Source: Ambulatory Visit | Attending: Internal Medicine | Admitting: Internal Medicine

## 2014-09-09 VITALS — BP 150/52 | HR 78 | Temp 98.0°F | Wt 162.0 lb

## 2014-09-09 DIAGNOSIS — R600 Localized edema: Secondary | ICD-10-CM | POA: Diagnosis present

## 2014-09-09 DIAGNOSIS — I8390 Asymptomatic varicose veins of unspecified lower extremity: Secondary | ICD-10-CM | POA: Diagnosis not present

## 2014-09-09 DIAGNOSIS — I10 Essential (primary) hypertension: Secondary | ICD-10-CM | POA: Diagnosis not present

## 2014-09-09 DIAGNOSIS — R609 Edema, unspecified: Secondary | ICD-10-CM

## 2014-09-09 MED ORDER — FUROSEMIDE 20 MG PO TABS: 40.0000 mg | ORAL_TABLET | Freq: Every day | ORAL | Status: DC

## 2014-09-09 MED ORDER — LOSARTAN POTASSIUM 50 MG PO TABS: 50.0000 mg | ORAL_TABLET | Freq: Every day | ORAL | Status: DC
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
## 2014-09-09 NOTE — Patient Instructions (Signed)
Take the medication as prescribed  We'll get a ultrasound  Use compression stockings  Elevated your legs at least 1 hour twice a day  Please come back in 2 weeks

## 2014-09-09 NOTE — Progress Notes (Signed)
Pre visit review using our clinic review tool, if applicable. No additional management support is needed unless otherwise documented below in the visit note. 
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
## 2014-09-09 NOTE — Telephone Encounter (Signed)
Patient arrived for appointment with Dr. Larose Kells.  eal

## 2014-09-10 ENCOUNTER — Telehealth: Payer: Self-pay | Admitting: Internal Medicine

## 2014-09-10 DIAGNOSIS — R600 Localized edema: Secondary | ICD-10-CM | POA: Insufficient documentation

## 2014-09-10 NOTE — Assessment & Plan Note (Signed)
Edema, Lower extremity edema with recently positive d-dimer, BMP negative. Likely edema is positional and from venous insufficiency. To be sure we'll check a bilateral ultrasound to rule out DVT. She had venous surgery in 2014, refer back to surgery? Also encouraged leg elevation and compression stockings.

## 2014-09-10 NOTE — Telephone Encounter (Signed)
LMOM informing Pt's brother to call back regarding sister.

## 2014-09-10 NOTE — Assessment & Plan Note (Signed)
Hypertension, Currently taking losartan HCT 50-12.5  started elsewhere. Recent BMP at emergency room was fine. BP continue to be elevated and she has lower extremity edema  . Plan: Switch to losartan 50 and Lasix 40. Follow-up in 2 weeks here at this office. Will discuss  plan with his brother to increase compliance

## 2014-09-10 NOTE — Telephone Encounter (Signed)
Please call the patient's brother [name] - [phone]  discuss plan of care with him to increase patient compliance with advise

## 2014-09-12 NOTE — Telephone Encounter (Signed)
Unable to get in contact with Pt's brother.

## 2014-09-12 NOTE — Telephone Encounter (Signed)
Send him a letter, ask for a call back

## 2014-09-12 NOTE — Telephone Encounter (Signed)
Letter printed and mailed to Pt's brother, [name].

## 2014-09-12 NOTE — Telephone Encounter (Signed)
We do not have brothers address on file.

## 2014-09-15 NOTE — Telephone Encounter (Signed)
Spoke with [name], Pt's brother. Informed him of Pt's leg edema, Richard informed me that he knew about leg edema, that Pt has been trying to use compression stockings, but has hard time putting them on. Informed that Pt's daughter [name] is power of attorney.

## 2014-09-23 ENCOUNTER — Ambulatory Visit: Payer: Medicare Other | Admitting: Internal Medicine

## 2014-09-25 ENCOUNTER — Telehealth: Payer: Self-pay | Admitting: Internal Medicine
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
## 2014-09-25 NOTE — Telephone Encounter (Signed)
Spoke with patient's brother who stated that the patient is having a bad mental episode.  He states he has not noticed any difference in her legs and that the poison arrows is not a new belief.  He does not believe there is anything that our office can do for this.  He states he will call if symptoms worsen or leg symptoms worsen.

## 2014-10-14 ENCOUNTER — Telehealth: Payer: Self-pay | Admitting: Internal Medicine

## 2014-10-14 NOTE — Telephone Encounter (Signed)
Pre Visit letter sent  °

## 2014-10-15 ENCOUNTER — Encounter: Payer: Self-pay | Admitting: Podiatry

## 2014-10-15 ENCOUNTER — Ambulatory Visit (INDEPENDENT_AMBULATORY_CARE_PROVIDER_SITE_OTHER): Payer: Medicare Other | Admitting: Podiatry

## 2014-10-15 VITALS — BP 146/59 | HR 70

## 2014-10-15 DIAGNOSIS — M79606 Pain in leg, unspecified: Secondary | ICD-10-CM

## 2014-10-15 DIAGNOSIS — B351 Tinea unguium: Secondary | ICD-10-CM
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
## 2014-10-15 NOTE — Patient Instructions (Signed)
Seen for hypertrophic nails and painful corn left foot. All nails and corns debrided. Return in 3 months or as needed.

## 2014-11-04 ENCOUNTER — Encounter: Payer: Medicare Other | Admitting: Internal Medicine

## 2015-01-09 ENCOUNTER — Encounter: Payer: Self-pay | Admitting: Podiatry

## 2015-01-09 ENCOUNTER — Ambulatory Visit (INDEPENDENT_AMBULATORY_CARE_PROVIDER_SITE_OTHER): Payer: Medicare Other | Admitting: Podiatry

## 2015-01-09 VITALS — BP 139/52 | HR 70

## 2015-01-09 DIAGNOSIS — B351 Tinea unguium: Secondary | ICD-10-CM

## 2015-01-09 DIAGNOSIS — M79606 Pain in leg, unspecified: Secondary | ICD-10-CM

## 2015-01-09 NOTE — Progress Notes (Signed)
Sore corn for a couple of days 5th left.  Subjective:  81 y.o. year old female patient presents complaining of sore toe on 4th and 5th left foot.   Objective: Dermatologic: Hypertrophic nails x 10.  Digital corn 5th left. No infection or open lesions noted.  Vascular: Posterior tibial pulses are palpable on both feet, dorsalis pedis pulses are not palpable on both feet.  Positive of severe varicose vein over dorsum right.  Mild edema none pitting.  Orthopedic: Contracted lesser digits bilateral.  Neurologic: All epicritic and tactile sensations grossly intact.   Assessment:  Dystrophic mycotic nails x 10.  Digital corn 5th left pre ulcerative.  Varicose vein dorsum right.   Treatment: All mycotic nails, corns, calluses debrided.  Padded 5th digit left.  Return in 3 weeks to monitor 5th toe left.

## 2015-01-09 NOTE — Patient Instructions (Signed)
Seen for painful corns. Corns and nails debrided and padded on 4th and 5th toe left. Keep the pad for 2 weeks and return in 3 weeks.

## 2015-01-30 ENCOUNTER — Encounter: Payer: Self-pay | Admitting: Podiatry

## 2015-01-30 ENCOUNTER — Ambulatory Visit (INDEPENDENT_AMBULATORY_CARE_PROVIDER_SITE_OTHER): Payer: Medicare Other | Admitting: Podiatry

## 2015-01-30 VITALS — BP 134/61 | HR 64

## 2015-01-30 DIAGNOSIS — M2042 Other hammer toe(s) (acquired), left foot: Secondary | ICD-10-CM

## 2015-01-30 DIAGNOSIS — L84 Corns and callosities: Secondary | ICD-10-CM | POA: Diagnosis not present

## 2015-01-30 DIAGNOSIS — M79605 Pain in left leg: Secondary | ICD-10-CM

## 2015-01-30 NOTE — Progress Notes (Signed)
Subjective:  81 y.o. year old female patient presents complaining of sore toe on 4th and 5th left foot.   Objective: Dermatologic: Hypertrophic nails x 10.  Digital corn 5th left. No infection or open lesions noted.  Vascular: Posterior tibial pulses are palpable on both feet, dorsalis pedis pulses are not palpable on both feet.  Positive of severe varicose vein over dorsum right.  Mild edema none pitting.  Orthopedic: Contracted lesser digits bilateral.  Neurologic: All epicritic and tactile sensations grossly intact.   Assessment:  Dystrophic mycotic nails x 10.  Digital corn 5th left pre ulcerative.  Varicose vein dorsum right.   Treatment: All mycotic nails, corns, calluses debrided.  Padded 5th digit left.  Return in 3 weeks to monitor 5th toe left.

## 2015-01-30 NOTE — Patient Instructions (Signed)
Seen for painful corn 5th left. Doing well. Debrided and padded. Return in 2 month or sooner if needed.

## 2015-02-16 ENCOUNTER — Encounter: Payer: Self-pay | Admitting: Internal Medicine

## 2015-02-16 ENCOUNTER — Ambulatory Visit (INDEPENDENT_AMBULATORY_CARE_PROVIDER_SITE_OTHER): Payer: Medicare Other | Admitting: Internal Medicine

## 2015-02-16 VITALS — BP 104/70 | HR 82 | Temp 97.4°F | Resp 16 | Ht 63.5 in | Wt 163.2 lb

## 2015-02-16 DIAGNOSIS — I10 Essential (primary) hypertension: Secondary | ICD-10-CM | POA: Diagnosis not present

## 2015-02-16 MED ORDER — LOSARTAN POTASSIUM-HCTZ 50-12.5 MG PO TABS: 1.0000 | ORAL_TABLET | Freq: Every day | ORAL | Status: DC

## 2015-02-16 NOTE — Progress Notes (Signed)
Subjective:    Patient ID: Mikayla Marks, female    DOB: 1927-06-28, 81 y.o.   MRN: [id number]  DOS:  02/16/2015 Type of visit - description : Routine office visit Interval history: Patient states her daughter is concerned about her BP medication and would like to maybe stop the diuretic  and take medication that won't cause photosensitivity. In the past she took Lasix and losartan but now is taking hydrochlorothiazide and losartan. State her daughter "talk her out of Lasix".    Review of Systems In general feels well. BP today is very low but in the ambulatory setting is 120, 130. Denies chest pain or difficulty breathing No nausea, vomiting, diarrhea.  Past Medical History  Diagnosis Date  . HYPERTENSION   . Hyperlipidemia   . Schizoaffective disorder   . Osteoporosis   . SCC (squamous cell carcinoma)     in situ R pretibial area    Past Surgical History  Procedure Laterality Date  . Cataract extraction    . Cholecystectomy    . Partial hysterectomy      due to bleeding  . Tonsillectomy    . Breast biopsy      benign tumor  . Laser ablation Left     left LE   . Leg surgery Right 10/2012    Ruptured Veins    Social History   Social History  . Marital Status: Single    Spouse Name: N/A  . Number of Children: 3  . Years of Education: N/A   Occupational History  . not working    Social History Main Topics  . Smoking status: Never Smoker   . Smokeless tobacco: Never Used  . Alcohol Use: No  . Drug Use: No  . Sexual Activity: Not on file   Other Topics Concern  . Not on file   Social History Narrative   Daughter lives with her [name])     still drives   widow   Brother [name] in [place], lives 5 min away [phone]              Medication List       This list is accurate as of: 02/16/15 10:10 PM.  Always use your most recent med list.               CALTRATE 600 PO  Take by mouth.     cycloSPORINE 0.05 % ophthalmic emulsion    Commonly known as:  RESTASIS  Place 1 drop into both eyes 2 (two) times daily.     ezetimibe 10 MG tablet  Commonly known as:  ZETIA  Take 1 tablet (10 mg total) by mouth at bedtime.     Fish Oil 1000 MG Caps  Take by mouth daily.     losartan-hydrochlorothiazide 50-12.5 MG per tablet  Commonly known as:  HYZAAR  Take 1 tablet by mouth daily.     multivitamin with minerals tablet  Take 1 tablet by mouth daily.           Objective:   Physical Exam BP 104/70 mmHg  Pulse 82  Temp(Src) 97.4 F (36.3 C) (Oral)  Resp 16  Ht 5' 3.5" (1.613 m)  Wt 163 lb 3.2 oz (74.027 kg)  BMI 28.45 kg/m2  SpO2 99% General:   Well developed, well nourished . NAD.  HEENT:  Normocephalic . Face symmetric, atraumatic Lungs:  CTA B Normal respiratory effort, no intercostal retractions, no accessory muscle use. Heart: RRR,  no murmur.  No pretibial edema bilaterally  Skin: Not pale. Not jaundice Neurologic:  alert & oriented X3.  Speech normal, gait appropriate for age and unassisted Psych--  No anxious or depressed appearing. Made several statements consistent with her history of schizophrenia     Assessment & Plan:

## 2015-02-16 NOTE — Patient Instructions (Signed)
Get your blood work before you leave    Continue taking the same medications   Check the  blood pressure 2 or 3 times a  Week   Be sure your blood pressure is between 110/65 and  145/85.  if it is consistently higher or lower, let me know

## 2015-02-16 NOTE — Assessment & Plan Note (Signed)
BP today slightly low but on chart review most of the BPs are ok; was switch from Lasix to HCTZ, see history of present illness. Patient reports her daughter likes to change her medication, the patient is satisfied with current treatment, ambulatory BPs are satisfactory. She is at some risk of phototoxicity but the patient states she understands that, she is not excessively exposed to the sun and actually got had a hat and sunscreen. Plan: BMP, continue with present care with hydrochlorothiazide and losartan. Follow-up for a physical in few months

## 2015-02-16 NOTE — Progress Notes (Signed)
Pre visit review using our clinic review tool, if applicable. No additional management support is needed unless otherwise documented below in the visit note. 

## 2015-02-17 LAB — BASIC METABOLIC PANEL
BUN: 20 mg/dL (ref 6–23)
CALCIUM: 9.7 mg/dL (ref 8.4–10.5)
CO2: 31 meq/L (ref 19–32)
CREATININE: 0.99 mg/dL (ref 0.40–1.20)
Chloride: 102 mEq/L (ref 96–112)
GFR: 56.63 mL/min — ABNORMAL LOW (ref >60.00)
GLUCOSE: 85 mg/dL (ref 70–99)
Potassium: 4.2 mEq/L (ref 3.5–5.1)
Sodium: 140 mEq/L (ref 135–145)

## 2015-03-27 DIAGNOSIS — L602 Onychogryphosis: Secondary | ICD-10-CM | POA: Diagnosis not present

## 2015-03-27 DIAGNOSIS — I739 Peripheral vascular disease, unspecified: Secondary | ICD-10-CM | POA: Diagnosis not present

## 2015-03-27 DIAGNOSIS — M79672 Pain in left foot: Secondary | ICD-10-CM | POA: Diagnosis not present

## 2015-03-27 DIAGNOSIS — M79675 Pain in left toe(s): Secondary | ICD-10-CM | POA: Diagnosis not present

## 2015-03-27 DIAGNOSIS — L84 Corns and callosities: Secondary | ICD-10-CM | POA: Diagnosis not present

## 2015-04-21 DIAGNOSIS — B351 Tinea unguium: Secondary | ICD-10-CM | POA: Diagnosis not present

## 2015-04-21 DIAGNOSIS — M79674 Pain in right toe(s): Secondary | ICD-10-CM | POA: Diagnosis not present

## 2015-04-21 DIAGNOSIS — H16223 Keratoconjunctivitis sicca, not specified as Sjogren's, bilateral: Secondary | ICD-10-CM | POA: Diagnosis not present

## 2015-04-21 DIAGNOSIS — H578 Other specified disorders of eye and adnexa: Secondary | ICD-10-CM | POA: Diagnosis not present

## 2015-04-21 DIAGNOSIS — Z961 Presence of intraocular lens: Secondary | ICD-10-CM | POA: Diagnosis not present

## 2015-04-21 DIAGNOSIS — M79675 Pain in left toe(s): Secondary | ICD-10-CM | POA: Diagnosis not present

## 2015-04-21 DIAGNOSIS — L84 Corns and callosities: Secondary | ICD-10-CM | POA: Diagnosis not present

## 2015-04-21 DIAGNOSIS — M2042 Other hammer toe(s) (acquired), left foot: Secondary | ICD-10-CM | POA: Diagnosis not present

## 2015-05-06 DIAGNOSIS — M546 Pain in thoracic spine: Secondary | ICD-10-CM | POA: Diagnosis not present

## 2015-05-06 DIAGNOSIS — J918 Pleural effusion in other conditions classified elsewhere: Secondary | ICD-10-CM | POA: Diagnosis not present

## 2015-05-08 DIAGNOSIS — M549 Dorsalgia, unspecified: Secondary | ICD-10-CM | POA: Diagnosis not present

## 2015-05-08 DIAGNOSIS — M545 Low back pain: Secondary | ICD-10-CM | POA: Diagnosis not present

## 2015-05-08 DIAGNOSIS — I10 Essential (primary) hypertension: Secondary | ICD-10-CM | POA: Diagnosis not present

## 2015-05-08 DIAGNOSIS — M47816 Spondylosis without myelopathy or radiculopathy, lumbar region: Secondary | ICD-10-CM | POA: Diagnosis not present

## 2015-06-06 DIAGNOSIS — M545 Low back pain: Secondary | ICD-10-CM | POA: Diagnosis not present

## 2015-07-30 ENCOUNTER — Ambulatory Visit: Payer: Medicare Other

## 2015-07-30 ENCOUNTER — Telehealth: Payer: Self-pay | Admitting: Internal Medicine

## 2015-07-30 DIAGNOSIS — I10 Essential (primary) hypertension: Secondary | ICD-10-CM

## 2015-07-30 MED ORDER — EZETIMIBE 10 MG PO TABS: 10.0000 mg | ORAL_TABLET | Freq: Every day | ORAL | Status: DC

## 2015-07-30 MED ORDER — LOSARTAN POTASSIUM-HCTZ 50-12.5 MG PO TABS: 1.0000 | ORAL_TABLET | Freq: Every day | ORAL | Status: DC

## 2015-07-30 NOTE — Telephone Encounter (Signed)
Relation to [id number] Call back number:[phone] Pharmacy: [facility], [place] [phone] (Fax)         Reason for call:  Patient requesting a refill losartan-hydrochlorothiazide (HYZAAR) 50-12.5 MG per tablet and ezetimibe (ZETIA) 10 MG tablet

## 2015-07-30 NOTE — Telephone Encounter (Signed)
Rxs sent

## 2015-08-04 ENCOUNTER — Ambulatory Visit (INDEPENDENT_AMBULATORY_CARE_PROVIDER_SITE_OTHER): Payer: Medicare Other | Admitting: Podiatry

## 2015-08-04 ENCOUNTER — Encounter: Payer: Self-pay | Admitting: Podiatry

## 2015-08-04 VITALS — BP 142/60 | HR 82

## 2015-08-04 DIAGNOSIS — B351 Tinea unguium: Secondary | ICD-10-CM

## 2015-08-04 DIAGNOSIS — M79606 Pain in leg, unspecified: Secondary | ICD-10-CM

## 2015-08-04 NOTE — Progress Notes (Signed)
Subjective:  82 y.o. year old female patient presents complaining of sore toe on 4th and 5th left foot.   Objective: Dermatologic: Hypertrophic nails x 10.  Digital corn 5th left. No infection or open lesions noted.  Vascular: Posterior tibial pulses are palpable on both feet, dorsalis pedis pulses are not palpable on both feet.  Positive of severe varicose vein over dorsum right.  Mild edema none pitting.  Orthopedic: Contracted lesser digits bilateral.  Neurologic: All epicritic and tactile sensations grossly intact.   Assessment:  Dystrophic mycotic nails x 10.  Digital corn 5th left pre ulcerative.  Varicose vein dorsum right.   Treatment: All mycotic nails, corns, calluses debrided.  Padded 5th digit left.  Return in 3 weeks to monitor 5th toe left.

## 2015-08-04 NOTE — Patient Instructions (Signed)
Seen for hypertrophic nails. All nails debrided. Return in 3 months or as needed.  

## 2015-08-07 ENCOUNTER — Ambulatory Visit: Payer: Medicare Other

## 2015-08-07 ENCOUNTER — Ambulatory Visit (HOSPITAL_BASED_OUTPATIENT_CLINIC_OR_DEPARTMENT_OTHER)
Admission: RE | Admit: 2015-08-07 | Discharge: 2015-08-07 | Disposition: A | Discharge status: Home / Self Care | Payer: Medicare Other | Source: Ambulatory Visit | Attending: Internal Medicine | Admitting: Internal Medicine

## 2015-08-07 ENCOUNTER — Encounter: Payer: Self-pay | Admitting: Internal Medicine

## 2015-08-07 ENCOUNTER — Other Ambulatory Visit: Payer: Self-pay | Admitting: Internal Medicine

## 2015-08-07 ENCOUNTER — Ambulatory Visit (INDEPENDENT_AMBULATORY_CARE_PROVIDER_SITE_OTHER): Payer: Medicare Other | Admitting: Internal Medicine

## 2015-08-07 VITALS — BP 122/74 | HR 55 | Temp 98.3°F | Ht 64.0 in | Wt 159.1 lb

## 2015-08-07 DIAGNOSIS — J948 Other specified pleural conditions: Secondary | ICD-10-CM

## 2015-08-07 DIAGNOSIS — Z23 Encounter for immunization: Secondary | ICD-10-CM | POA: Diagnosis not present

## 2015-08-07 DIAGNOSIS — M40204 Unspecified kyphosis, thoracic region: Secondary | ICD-10-CM

## 2015-08-07 DIAGNOSIS — J90 Pleural effusion, not elsewhere classified: Secondary | ICD-10-CM | POA: Diagnosis not present

## 2015-08-07 DIAGNOSIS — E785 Hyperlipidemia, unspecified: Secondary | ICD-10-CM

## 2015-08-07 DIAGNOSIS — M4854XA Collapsed vertebra, not elsewhere classified, thoracic region, initial encounter for fracture: Secondary | ICD-10-CM | POA: Insufficient documentation

## 2015-08-07 DIAGNOSIS — M810 Age-related osteoporosis without current pathological fracture: Secondary | ICD-10-CM

## 2015-08-07 DIAGNOSIS — M546 Pain in thoracic spine: Secondary | ICD-10-CM | POA: Diagnosis not present

## 2015-08-07 DIAGNOSIS — E559 Vitamin D deficiency, unspecified: Secondary | ICD-10-CM | POA: Diagnosis not present

## 2015-08-07 DIAGNOSIS — I10 Essential (primary) hypertension: Secondary | ICD-10-CM

## 2015-08-07 LAB — LIPID PANEL
CHOL/HDL RATIO: 3
Cholesterol: 240 mg/dL — ABNORMAL HIGH (ref 0–200)
HDL: 70.1 mg/dL (ref >39.00)
LDL Cholesterol: 147 mg/dL — ABNORMAL HIGH (ref 0–99)
NonHDL: 170.34
Triglycerides: 116 mg/dL (ref 0.0–149.0)
VLDL: 23.2 mg/dL (ref 0.0–40.0)

## 2015-08-07 LAB — BASIC METABOLIC PANEL
BUN: 20 mg/dL (ref 6–23)
CO2: 27 mEq/L (ref 19–32)
CREATININE: 1.01 mg/dL (ref 0.40–1.20)
Calcium: 9.7 mg/dL (ref 8.4–10.5)
Chloride: 104 mEq/L (ref 96–112)
GFR: 55.27 mL/min — ABNORMAL LOW (ref >60.00)
Glucose, Bld: 86 mg/dL (ref 70–99)
Potassium: 4.3 mEq/L (ref 3.5–5.1)
Sodium: 140 mEq/L (ref 135–145)

## 2015-08-07 LAB — AST: AST: 19 U/L (ref 0–37)

## 2015-08-07 LAB — ALT: ALT: 13 U/L (ref 0–35)

## 2015-08-07 NOTE — Progress Notes (Signed)
Pre visit review using our clinic review tool, if applicable. No additional management support is needed unless otherwise documented below in the visit note. 

## 2015-08-07 NOTE — Patient Instructions (Signed)
GO TO THE LAB : Get the blood work    GO TO THE FRONT DESK  Schedule a complete physical exam to be done in 4-5 months Please be fasting   STOP BY THE FIRST FLOOR:  get the XR   Take a OTC supplemental calcium and vitamin D daily  Continue taking a low dose of hydrocodone as needed for pain. If the pain gets worse let us know.

## 2015-08-07 NOTE — Progress Notes (Signed)
Subjective:    Patient ID: Mikayla Marks, female    DOB: 1927/10/14, 82 y.o.   MRN: [id number]  DOS:  08/07/2015 Type of visit - description : Routine checkup, several issues Interval history:  Was on a long trip, [place], developed bilateral low back pain without radiation or lower extremity paresthesias. Was seen at urgent care on the ER: X-rays were done, see below, prescribed hydrocodone which she takes from time to time and reports relief. At this point pain is still there but not severe.  05/08/2015 CXR: Possible small left pleural effusion. Enlarged cardiac contour without overt pulmonary edema. Possible mild mid thoracic vertebral body wedge compression deformity versus physiologic wedging with exaggerated kyphosis.    05/08/2015 XR LUMBAR SPINE 2. IMPRESSION- No acute findings. Mild to moderate multilevel degenerative changes as above.    HTN: Good compliance of medication, ambulatory BPs when checked normal Osteoporosis: due for a  density test  Review of Systems  In general feeling well, she remains active doing yard work. "I feel better now than in a long time" Denies fever chills. No difficulty breathing. + Lower extremity edema usually at the end of the day. No nausea, vomiting, diarrhea.  Past Medical History  Diagnosis Date  . HYPERTENSION   . Hyperlipidemia   . Schizoaffective disorder ([place])   . Osteoporosis   . SCC (squamous cell carcinoma)     in situ R pretibial area    Past Surgical History  Procedure Laterality Date  . Cataract extraction    . Cholecystectomy    . Partial hysterectomy      due to bleeding  . Tonsillectomy    . Breast biopsy      benign tumor  . Laser ablation Left     left LE   . Leg surgery Right 10/2012    Ruptured Veins    Social History   Social History  . Marital Status: Single    Spouse Name: N/A  . Number of Children: 3  . Years of Education: N/A   Occupational History  . not working    Social History  Main Topics  . Smoking status: Never Smoker   . Smokeless tobacco: Never Used  . Alcohol Use: No  . Drug Use: No  . Sexual Activity: Not on file   Other Topics Concern  . Not on file   Social History Narrative   Daughter lives with her Mikayla Marks)     still drives   widow   Brother Mikayla Marks in [place], lives 5 min away [phone]              Medication List       This list is accurate as of: 08/07/15 11:59 PM.  Always use your most recent med list.               CALTRATE 600 PO  Take by mouth.     cycloSPORINE 0.05 % ophthalmic emulsion  Commonly known as:  RESTASIS  Place 1 drop into both eyes 2 (two) times daily.     ezetimibe 10 MG tablet  Commonly known as:  ZETIA  Take 1 tablet (10 mg total) by mouth at bedtime.     Fish Oil 1000 MG Caps  Take by mouth daily.     HYDROcodone-acetaminophen 5-325 MG tablet  Commonly known as:  NORCO/VICODIN  Take 1-2 tablets by mouth every 4 (four) hours as needed for severe pain.     losartan-hydrochlorothiazide 50-12.5 MG tablet  Commonly known as:  HYZAAR  Take 1 tablet by mouth daily.     multivitamin with minerals tablet  Take 1 tablet by mouth daily.           Objective:   Physical Exam BP 122/74 mmHg  Pulse 55  Temp(Src) 98.3 F (36.8 C) (Oral)  Ht 5\' 4"  (1.626 m)  Wt 159 lb 2 oz (72.179 kg)  BMI 27.30 kg/m2  SpO2 99% General:   Well developed, well nourished . NAD.  HEENT:  Normocephalic . Face symmetric, atraumatic Lungs:  CTA B Normal respiratory effort, no intercostal retractions, no accessory muscle use. Heart: RRR,  no murmur.  no pretibial edema bilaterally  Abdomen:  Not distended, soft, non-tender. No rebound or rigidity.  MSK: + kyphosis, worse that in the past.  Skin: Not pale. Not jaundice Neurologic:  alert & oriented X3.  Speech normal, gait appropriate for age and unassisted. DTRs symmetric, straight leg test negative Psych--  Cognition and judgment appear intact.  Cooperative  with normal attention span and concentration.  Behavior appropriate. No anxious or depressed appearing.      Assessment & Plan:   Assessment HTN Hyperlipidemia Schizophrenia Osteoporosis : Per DEXA 2010, DEXA 2013 with Dr. Amalia Marks: Osteopenia. Vitamin D deficiency Varicose pain, lower extremity edema: [place] (-) DVT 08-2014 SCC, R pretibial  PLAN 08-06-2014 HTN: Continue Hyzaar, check a BMP Hyperlipidemia: Continue Zetia, check a FLP, AST, ALT Osteoporosis: Will check a bone density test, check a vitamin D. Recommend calcium and vitamin D. Kyphosis: Get a x-ray of the thoracic spine to document current state of her vertebral bones Back pain: Continue with low dose of Vicodin. Question of pleural effusion on x-ray: Check a chest x-ray 1 care-- flu shot RTC 4-5 months , CPX

## 2015-08-09 LAB — CBC WITH DIFFERENTIAL/PLATELET
BASOS ABS: 0 10*3/uL (ref 0.0–0.1)
Basophils Relative: 0.7 % (ref 0.0–3.0)
Eosinophils Absolute: 0.3 10*3/uL (ref 0.0–0.7)
Eosinophils Relative: 6.3 % — ABNORMAL HIGH (ref 0.0–5.0)
HCT: 43.4 % (ref 36.0–46.0)
Hemoglobin: 13.5 g/dL (ref 12.0–15.0)
LYMPHS ABS: 1.6 10*3/uL (ref 0.7–4.0)
Lymphocytes Relative: 34.1 % (ref 12.0–46.0)
MCHC: 31.1 g/dL (ref 30.0–36.0)
MCV: 102.1 fl — ABNORMAL HIGH (ref 78.0–100.0)
MONO ABS: 0.4 10*3/uL (ref 0.1–1.0)
MONOS PCT: 8.3 % (ref 3.0–12.0)
NEUTROS PCT: 50.6 % (ref 43.0–77.0)
Neutro Abs: 2.3 10*3/uL (ref 1.4–7.7)
Platelets: 355 10*3/uL (ref 150.0–400.0)
RBC: 4.25 Mil/uL (ref 3.87–5.11)
RDW: 13.7 % (ref 11.5–15.5)
WBC: 4.6 10*3/uL (ref 4.0–10.5)

## 2015-08-10 LAB — VITAMIN D 1,25 DIHYDROXY
Vitamin D 1, 25 (OH)2 Total: 39 pg/mL (ref 18–72)
Vitamin D2 1, 25 (OH)2: <8 pg/mL
Vitamin D3 1, 25 (OH)2: 39 pg/mL

## 2015-09-03 ENCOUNTER — Other Ambulatory Visit: Payer: Self-pay

## 2015-09-03 DIAGNOSIS — I10 Essential (primary) hypertension: Secondary | ICD-10-CM

## 2015-09-03 MED ORDER — LOSARTAN POTASSIUM-HCTZ 50-12.5 MG PO TABS: 1.0000 | ORAL_TABLET | Freq: Every day | ORAL | Status: DC

## 2015-09-03 MED ORDER — EZETIMIBE 10 MG PO TABS: 10.0000 mg | ORAL_TABLET | Freq: Every day | ORAL | Status: DC

## 2015-10-14 DIAGNOSIS — M549 Dorsalgia, unspecified: Secondary | ICD-10-CM | POA: Diagnosis not present

## 2015-10-15 ENCOUNTER — Ambulatory Visit
Admission: RE | Admit: 2015-10-15 | Discharge: 2015-10-15 | Disposition: A | Discharge status: Home / Self Care | Payer: Medicare Other | Source: Ambulatory Visit | Attending: Physical Medicine and Rehabilitation | Admitting: Physical Medicine and Rehabilitation

## 2015-10-15 ENCOUNTER — Other Ambulatory Visit: Payer: Self-pay | Admitting: Physical Medicine and Rehabilitation

## 2015-10-15 DIAGNOSIS — M5126 Other intervertebral disc displacement, lumbar region: Secondary | ICD-10-CM | POA: Diagnosis not present

## 2015-10-15 DIAGNOSIS — M5489 Other dorsalgia: Secondary | ICD-10-CM

## 2015-10-15 DIAGNOSIS — M47814 Spondylosis without myelopathy or radiculopathy, thoracic region: Secondary | ICD-10-CM | POA: Diagnosis not present

## 2015-10-19 ENCOUNTER — Telehealth: Payer: Self-pay | Admitting: Oncology

## 2015-10-19 DIAGNOSIS — M549 Dorsalgia, unspecified: Secondary | ICD-10-CM | POA: Diagnosis not present

## 2015-10-19 NOTE — Telephone Encounter (Signed)
Referring office was contacted to refax the referral due to not receiving all of the referral.

## 2015-10-20 ENCOUNTER — Telehealth: Payer: Self-pay | Admitting: Oncology

## 2015-10-20 ENCOUNTER — Ambulatory Visit: Payer: Medicare Other | Admitting: Oncology

## 2015-10-20 NOTE — Telephone Encounter (Signed)
Lt mess for patient regarding referring office and attempted to reschedule her appt.

## 2015-10-23 ENCOUNTER — Ambulatory Visit: Payer: Medicare Other | Admitting: Internal Medicine

## 2015-10-26 ENCOUNTER — Encounter: Payer: Self-pay | Admitting: Internal Medicine

## 2015-10-26 ENCOUNTER — Ambulatory Visit (INDEPENDENT_AMBULATORY_CARE_PROVIDER_SITE_OTHER): Payer: Medicare Other | Admitting: Internal Medicine

## 2015-10-26 VITALS — BP 142/82 | HR 93 | Temp 98.2°F | Resp 18 | Ht 64.0 in | Wt 164.0 lb

## 2015-10-26 DIAGNOSIS — Z09 Encounter for follow-up examination after completed treatment for conditions other than malignant neoplasm: Secondary | ICD-10-CM

## 2015-10-26 DIAGNOSIS — I10 Essential (primary) hypertension: Secondary | ICD-10-CM

## 2015-10-26 DIAGNOSIS — M40204 Unspecified kyphosis, thoracic region: Secondary | ICD-10-CM

## 2015-10-26 DIAGNOSIS — R937 Abnormal findings on diagnostic imaging of other parts of musculoskeletal system: Secondary | ICD-10-CM | POA: Diagnosis not present

## 2015-10-26 DIAGNOSIS — E785 Hyperlipidemia, unspecified: Secondary | ICD-10-CM | POA: Diagnosis not present

## 2015-10-26 MED ORDER — EZETIMIBE 10 MG PO TABS: 10.0000 mg | ORAL_TABLET | Freq: Every day | ORAL | Status: DC

## 2015-10-26 NOTE — Progress Notes (Signed)
Subjective:    Patient ID: Mikayla Marks, female    DOB: [birth year], 82 y.o.   MRN: [id number]  DOS:  10/26/2015 Type of visit - description : Acute visit Interval history: Since  the last time she was here, she developed more back pain, went to see Dr. Mina Marble, MRI was done, it was abnormal, reportedly she has an appointment to see orthopedic surgery tomorrow, also was referred to hematology oncology bc MRI showed changes suspicious for malignancy, has not set up that appointment yet.   High cholesterol: Not taking  Zetia     Review of Systems Denies fever, chills, night sweats or weight loss No lower extremity paresthesias Continue with lower extremity edema  Past Medical History  Diagnosis Date  . HYPERTENSION   . Hyperlipidemia   . Schizoaffective disorder ([place])   . Osteoporosis   . SCC (squamous cell carcinoma)     in situ R pretibial area    Past Surgical History  Procedure Laterality Date  . Cataract extraction    . Cholecystectomy    . Partial hysterectomy      due to bleeding  . Tonsillectomy    . Breast biopsy      benign tumor  . Laser ablation Left     left LE   . Leg surgery Right 10/2012    Ruptured Veins    Social History   Social History  . Marital Status: Single    Spouse Name: N/A  . Number of Children: 3  . Years of Education: N/A   Occupational History  . not working    Social History Main Topics  . Smoking status: Never Smoker   . Smokeless tobacco: Never Used  . Alcohol Use: No  . Drug Use: No  . Sexual Activity: Not on file   Other Topics Concern  . Not on file   Social History Narrative   Daughter lives with her [name])     still drives   widow   Brother [name] in [place], lives 5 min away [phone]              Medication List       This list is accurate as of: 10/26/15  8:09 PM.  Always use your most recent med list.               CALTRATE 600 PO  Take by mouth. Reported on 10/26/2015     cycloSPORINE 0.05  % ophthalmic emulsion  Commonly known as:  RESTASIS  Place 1 drop into both eyes 2 (two) times daily.     ezetimibe 10 MG tablet  Commonly known as:  ZETIA  Take 1 tablet (10 mg total) by mouth at bedtime.     Fish Oil 1000 MG Caps  Take by mouth daily. Reported on 10/26/2015     HYDROcodone-acetaminophen 5-325 MG tablet  Commonly known as:  NORCO/VICODIN  Take 1-2 tablets by mouth every 4 (four) hours as needed for severe pain.     losartan-hydrochlorothiazide 50-12.5 MG tablet  Commonly known as:  HYZAAR  Take 1 tablet by mouth daily.     multivitamin with minerals tablet  Take 1 tablet by mouth daily.           Objective:   Physical Exam BP 142/82 mmHg  Pulse 93  Temp(Src) 98.2 F (36.8 C) (Oral)  Resp 18  Ht 5\' 4"  (1.626 m)  Wt 164 lb (74.39 kg)  BMI 28.14 kg/m2  SpO2  98% General:   Well developed, well nourished . NAD.  HEENT:  Normocephalic . Face symmetric, atraumatic Lungs:  CTA B Normal respiratory effort, no intercostal retractions, no accessory muscle use. Heart: RRR,  no murmur.  +/+++ pretibial edema bilaterally  Skin: Not pale. Not jaundice Neurologic:  alert & oriented X3.  Speech normal, gait appropriate for age and unassisted Psych--  Behavior appropriate. Moderately  anxious , no depressed appearing.      Assessment & Plan:   Assessment HTN Hyperlipidemia Schizophrenia Osteoporosis : Per DEXA 2010, DEXA 2013 with Dr. Amalia Hailey: Osteopenia. Vitamin D deficiency Varicose pain, lower extremity edema: [place] (-) DVT 08-2014 SCC, R pretibial  PLAN Dyslipidemia: Not on Zetia, recommend to restart. Refill sent Osteoporosis:  Bone density test was not done. Kyphosis, back pain: Had further eval by Dr. Mina Marble, MRI was abnormal, see report below. Metastatic dz? Ooncology has attempted to contact the patient twice, I strongly recommend the patient to call them back, I also talked with the daughter, she is adamant about the report being wrong because  states she was told by a radiologists  the preliminary report was okay. I emphasized the fact that the final report show abnormalities that need further eval  but pt's daughter will not change her mind. Eventually the patient said that we'll see her orthopedic doctor tomorrow and will  take it from there. RTC 3 months Spine MRI MRI 10/15/2015 1. Slightly expansile lesions within the T8 and T9 vertebral bodies with associated pathologic fractures and osseous retropulsion.Additional smaller lesions in the T11 pedicles bilaterally. These marrow lesions are worrisome for metastatic disease, myeloma or lymphoma. 2. Because of the osseous retropulsion, there is partial effacement of the CSF surrounding the thoracic cord at T8 and T9. No cord compression identified at this time, although patient at risk for that. 3. No suspicious osseous findings in the lumbar spine. 4. Mild multilevel spondylosis without high-grade spinal stenosis or nerve root encroachment on the basis of disc disease. 5. These results will be called to the ordering clinician or representative by the Radiologist Assistant, and communication documented in the PACS or zVision Dashboard.    Today, I spent more than 30   min with the patient and her daughter >50% of the time counseling regards the MRI report, the need for further evaluation.

## 2015-10-26 NOTE — Patient Instructions (Signed)
Please come back in 3 months.

## 2015-10-26 NOTE — Assessment & Plan Note (Signed)
Dyslipidemia: Not on Zetia, recommend to restart. Refill sent Osteoporosis:  Bone density test was not done. Kyphosis, back pain: Had further eval by Dr. Mina Marble, MRI was abnormal, see report below. Metastatic dz? Ooncology has attempted to contact the patient twice, I strongly recommend the patient to call them back, I also talked with the daughter, she is adamant about the report being wrong because states she was told by a radiologists  the preliminary report was okay. I emphasized the fact that the final report show abnormalities that need further eval  but pt's daughter will not change her mind. Eventually the patient said that we'll see her orthopedic doctor tomorrow and will  take it from there. RTC 3 months Spine MRI MRI 10/15/2015 1. Slightly expansile lesions within the T8 and T9 vertebral bodies with associated pathologic fractures and osseous retropulsion.Additional smaller lesions in the T11 pedicles bilaterally. These marrow lesions are worrisome for metastatic disease, myeloma or lymphoma. 2. Because of the osseous retropulsion, there is partial effacement of the CSF surrounding the thoracic cord at T8 and T9. No cord compression identified at this time, although patient at risk for that. 3. No suspicious osseous findings in the lumbar spine. 4. Mild multilevel spondylosis without high-grade spinal stenosis or nerve root encroachment on the basis of disc disease. 5. These results will be called to the ordering clinician or representative by the Radiologist Assistant, and communication documented in the PACS or zVision Dashboard.

## 2015-10-26 NOTE — Progress Notes (Signed)
Pre visit review using our clinic review tool, if applicable. No additional management support is needed unless otherwise documented below in the visit note. 

## 2015-10-27 DIAGNOSIS — S22078A Other fracture of T9-T10 vertebra, initial encounter for closed fracture: Secondary | ICD-10-CM | POA: Diagnosis not present

## 2015-10-27 DIAGNOSIS — S22068A Other fracture of T7-T8 thoracic vertebra, initial encounter for closed fracture: Secondary | ICD-10-CM | POA: Diagnosis not present

## 2015-11-11 ENCOUNTER — Other Ambulatory Visit (INDEPENDENT_AMBULATORY_CARE_PROVIDER_SITE_OTHER): Payer: Medicare Other

## 2015-11-11 ENCOUNTER — Ambulatory Visit (INDEPENDENT_AMBULATORY_CARE_PROVIDER_SITE_OTHER): Payer: Medicare Other | Admitting: Internal Medicine

## 2015-11-11 ENCOUNTER — Ambulatory Visit (INDEPENDENT_AMBULATORY_CARE_PROVIDER_SITE_OTHER)
Admission: RE | Admit: 2015-11-11 | Discharge: 2015-11-11 | Disposition: A | Discharge status: Home / Self Care | Payer: Medicare Other | Source: Ambulatory Visit | Attending: Internal Medicine | Admitting: Internal Medicine

## 2015-11-11 ENCOUNTER — Encounter: Payer: Self-pay | Admitting: Internal Medicine

## 2015-11-11 VITALS — BP 118/66 | HR 96 | Ht 61.0 in | Wt 161.2 lb

## 2015-11-11 DIAGNOSIS — R0600 Dyspnea, unspecified: Secondary | ICD-10-CM | POA: Diagnosis not present

## 2015-11-11 DIAGNOSIS — J90 Pleural effusion, not elsewhere classified: Secondary | ICD-10-CM | POA: Insufficient documentation

## 2015-11-11 DIAGNOSIS — J9811 Atelectasis: Secondary | ICD-10-CM | POA: Diagnosis not present

## 2015-11-11 DIAGNOSIS — M8448XS Pathological fracture, other site, sequela: Secondary | ICD-10-CM

## 2015-11-11 LAB — CBC WITH DIFFERENTIAL/PLATELET
BASOS PCT: 0.5 % (ref 0.0–3.0)
Basophils Absolute: 0 10*3/uL (ref 0.0–0.1)
EOS PCT: 2.6 % (ref 0.0–5.0)
Eosinophils Absolute: 0.2 10*3/uL (ref 0.0–0.7)
HCT: 36.5 % (ref 36.0–46.0)
Hemoglobin: 12.4 g/dL (ref 12.0–15.0)
LYMPHS ABS: 1.3 10*3/uL (ref 0.7–4.0)
Lymphocytes Relative: 14.2 % (ref 12.0–46.0)
MCHC: 34 g/dL (ref 30.0–36.0)
MCV: 93.1 fl (ref 78.0–100.0)
MONO ABS: 0.7 10*3/uL (ref 0.1–1.0)
Monocytes Relative: 7.8 % (ref 3.0–12.0)
NEUTROS PCT: 74.9 % (ref 43.0–77.0)
Neutro Abs: 7 10*3/uL (ref 1.4–7.7)
PLATELETS: 390 10*3/uL (ref 150.0–400.0)
RBC: 3.92 Mil/uL (ref 3.87–5.11)
RDW: 12.8 % (ref 11.5–15.5)
WBC: 9.3 10*3/uL (ref 4.0–10.5)

## 2015-11-11 LAB — BASIC METABOLIC PANEL
BUN: 18 mg/dL (ref 6–23)
CHLORIDE: 103 meq/L (ref 96–112)
CO2: 28 mEq/L (ref 19–32)
Calcium: 9.6 mg/dL (ref 8.4–10.5)
Creatinine, Ser: 0.93 mg/dL (ref 0.40–1.20)
GFR: 60.76 mL/min (ref >60.00)
GLUCOSE: 105 mg/dL — AB (ref 70–99)
POTASSIUM: 3.6 meq/L (ref 3.5–5.1)
SODIUM: 140 meq/L (ref 135–145)

## 2015-11-11 LAB — HEPATIC FUNCTION PANEL
ALBUMIN: 4.2 g/dL (ref 3.5–5.2)
ALT: 14 U/L (ref 0–35)
AST: 14 U/L (ref 0–37)
Alkaline Phosphatase: 80 U/L (ref 39–117)
Bilirubin, Direct: 0 mg/dL (ref 0.0–0.3)
TOTAL PROTEIN: 7.4 g/dL (ref 6.0–8.3)
Total Bilirubin: 0.5 mg/dL (ref 0.2–1.2)

## 2015-11-11 LAB — SEDIMENTATION RATE: SED RATE: 42 mm/h — AB (ref 0–22)

## 2015-11-11 LAB — TSH: TSH: 1.38 u[IU]/mL (ref 0.35–4.50)

## 2015-11-11 LAB — BRAIN NATRIURETIC PEPTIDE: PRO B NATRI PEPTIDE: 68 pg/mL (ref 0.0–100.0)

## 2015-11-11 NOTE — Progress Notes (Signed)
Subjective:     Patient ID: Mikayla Marks, female   DOB: 07-23-1927       MRN: [id number]  HPI  46 yowf never smoker with new back pain Feb 2017 / then much worse mid April  2017 assoc with sense of sob > MRI T spine  10/15/15 > ? Pathologic  Vertebral fx's with ? small effusions Already referred to Dr Alen Blew but apparently declined for unclear reasons with last hone message from [facility] office dated 10/20/15 leaving message to reschedule and  referred to pulmonary clinic 11/11/2015 by Dr Larose Kells.     11/11/2015 1st [facility] office visit/ Tanayah Squitieri   Chief Complaint  Patient presents with  . Pulmonary Consult    Self referral. Pt states has hx of pleural effusion- Nov 2016. Pt c/o SOB off and on "like I need to stand up straight and take a deep breath".   breathing is better now, thinks she was poisoned "by the gas they used in [place]" about the same time the pain started and that was also when she noted gen swelling of abd/ both legs = and needing to sleep in recliner (more due to back than breathing though)  The back pain has been the main concern with onset about 2 m prior to OV  Much worse in [facility] April 2017 and better in brace  No obvious day to day or daytime variability or assoc chronic cough or cp or chest tightness, subjective wheeze or overt sinus or hb symptoms. No unusual exp hx or h/o childhood pna/ asthma or knowledge of premature birth.  Sleeping ok without nocturnal  or early am exacerbation  of respiratory  c/o's or need for noct saba. Also denies any obvious fluctuation of symptoms with weather or environmental changes or other aggravating or alleviating factors except as outlined above   Current Medications, Allergies, Complete Past Medical History, Past Surgical History, Family History, and Social History were reviewed in [employer] record.  ROS  The following are not active complaints unless bolded sore throat, dysphagia, dental problems, itching,  sneezing,  nasal congestion or excess/ purulent secretions, ear ache,   fever, chills, sweats, unintended wt loss, classically pleuritic or exertional cp, hemoptysis,  orthopnea pnd or leg swelling, presyncope, palpitations, abdominal pain, anorexia, nausea, vomiting, diarrhea  or change in bowel or bladder habits, change in stools or urine, dysuria,hematuria,  rash, arthralgias, visual complaints, headache, numbness, weakness or ataxia or problems with walking or coordination,  change in mood/affect or memory.        Review of Systems     Objective:   Physical Exam    amb wf wearing back brace / somewhat rambling historian freq interupted by daughter who eventually was asked to leave by the pt but refused to leave   Wt Readings from Last 3 Encounters:  11/11/15 161 lb 3.2 oz (73.12 kg)  10/26/15 164 lb (74.39 kg)  08/07/15 159 lb 2 oz (72.179 kg)    Vital signs reviewed    HEENT: nl dentition, turbinates, and oropharynx. Nl external ear canals without cough reflex   NECK :  without JVD/Nodes/TM/ nl carotid upstrokes bilaterally   LUNGS: no acc muscle use, severely kyphotic chest wall but chest  is clear to A and P bilaterally without cough on insp or exp maneuvers   CV:  RRR  no s3 or murmur or increase in P2,  1+ sym lower ext pitting edema  Knees down  ABD:  soft and  nontender with nl inspiratory excursion in the supine position. No bruits or organomegaly, bowel sounds nl  MS:  Nl gait/ ext warm without deformities, calf tenderness, cyanosis or clubbing No obvious joint restrictions / moves very slowly with back brace in place, one person assist for exam table   SKIN: warm and dry without lesions    NEURO:  alert, approp, nl sensorium with  no motor deficits   CXR PA and Lateral:   11/11/2015 :    I personally reviewed images and agree with radiology impression as follows:  1. Low lung volumes with mild bibasilar atelectasis and/or infiltrates.  2. Cardiomegaly. No  pulmonary venous congestion.  3. Questionable lytic lesion left upper posterior rib most likely left 6 rib. Further evaluation with whole-body bone scan and serum protein electrophoresis should be considered.  4. Stable mid thoracic vertebral body compression fracture. Diffuse osteopenia. Severe thoracic kyphosis/ compression fx's mid T spine c/w T8/9  No significant pleural effusions or cardiopulmonary findings.       Labs ordered/ reviewed:      Chemistry      Component Value Date/Time   NA 140 11/11/2015 1623   K 3.6 11/11/2015 1623   CL 103 11/11/2015 1623   CO2 28 11/11/2015 1623   BUN 18 11/11/2015 1623   CREATININE 0.93 11/11/2015 1623      Component Value Date/Time   CALCIUM 9.6 11/11/2015 1623   ALKPHOS 80 11/11/2015 1623   AST 14 11/11/2015 1623   ALT 14 11/11/2015 1623   BILITOT 0.5 11/11/2015 1623        Lab Results  Component Value Date   WBC 9.3 11/11/2015   HGB 12.4 11/11/2015   HCT 36.5 11/11/2015   MCV 93.1 11/11/2015   PLT 390.0 11/11/2015        Lab Results  Component Value Date   TSH 1.38 11/11/2015     Lab Results  Component Value Date   PROBNP 68.0 11/11/2015       Lab Results  Component Value Date   ESRSEDRATE 42* 11/11/2015        Assessment:

## 2015-11-11 NOTE — Patient Instructions (Addendum)
Please remember to go to the lab and x-ray department downstairs for your tests - we will call you with the results when they are available.   There will be no need for pulmonary follow up here but we will try our best to get you the care you need for your back but I would strongly recommend you follow your back doctor's advice on how to proceed next as this is clearly your most important problem

## 2015-11-12 DIAGNOSIS — M8448XA Pathological fracture, other site, initial encounter for fracture: Secondary | ICD-10-CM | POA: Insufficient documentation

## 2015-11-12 NOTE — Assessment & Plan Note (Signed)
Detected on MRI in setting of generalized edema but not significant on PA and Lateral view so no plan to w/u further at this point > could try mild diuretic for symptomatic generalized edema though I can't shed any light on why she has this with nl creat/alb/tsh/ bnp > Follow up per Primary Care planned

## 2015-11-12 NOTE — Progress Notes (Signed)
Quick Note:  LMTCB ______ 

## 2015-11-12 NOTE — Assessment & Plan Note (Addendum)
See MRI  10/15/15  T8/9 and pedicle involvement T11 > referred to [facility]   This is clearly the patient's greatest problem and likely represents met ca and needs w/u with PET and tissue dx by  the least invasive means.  I tried to get the pt and daughter to focus on this issue but encountered extreme skepticism to point of almost paranoid delusions but daughter that "too many errors were made" on the MRI and the daughter doesn't believe the scan or the doctors, including me.  I pointed out the scan shows findings exactly where the pt has pain and they could be very serious if not addressed to no avail.    Total time devoted to counseling  = 35/5mreview case with pt/ daughter / discussion of options/alternatives/ personally creating in presence of pt  then going over specific  Instructions directly with the pt as reflected in the AVS

## 2015-11-12 NOTE — Assessment & Plan Note (Signed)
No evidence of sign pleural effusions, chf, anemia and really more limited by back pain than sob.  If she has a pulmonary issue at all it is restrictive from the kyphosis and back brace but no further w/u indicated for now but note she is at risk of PE and d dimer in setting of likely underlying Ca is not  A helpful study (way non specific in this setting) so was not ordered and clinically does not fit her problem  Pulmonary f/u is prn

## 2015-11-13 NOTE — Progress Notes (Signed)
Quick Note:  I spoke with the pt and her daughter and notified of results/recs per DrWert Pt and her daughter verbalized understanding  I will mail the results to the pt per her request- address was verified ______

## 2015-11-13 NOTE — Progress Notes (Signed)
Quick Note:  LMTCB ______ 

## 2015-11-17 DIAGNOSIS — H16223 Keratoconjunctivitis sicca, not specified as Sjogren's, bilateral: Secondary | ICD-10-CM | POA: Diagnosis not present

## 2015-12-03 ENCOUNTER — Telehealth: Payer: Self-pay | Admitting: Internal Medicine

## 2015-12-03 NOTE — Telephone Encounter (Signed)
Spoke w/ Pt, was informed she has been experiencing back pain for several months and scheduled an appt w/ Dr. Lynann Bologna at [place] on 10/27/2015(notes have been scanned to chart), x-rays showed compression deformities at T8 and T9 vertebral bodies. Was recommended to use walker, back brace (which she has been only using during the day, not nightly when sleeping, to continue using Tylenol #3 and Robaxin for pain. Pt has only used Robaxin once because she did not like the way it made her feel, she would not elaborate. She called today because she continues with pain and weakness. She was seen by Dr. Melvyn Novas on 11/11/2015 and swelling was noted of abdomen, and lower extremities. Dr. Melvyn Novas informed Pt to follow-up to discuss w/ PCP. I have scheduled an appointment for 12/08/2015 at 1430. She is wanting to know what else can be done for her back pain in the meantime. Informed her PCP was out of the office this afternoon but would send message for advice and call her back. Pt verbalized understanding.

## 2015-12-03 NOTE — Telephone Encounter (Signed)
Relation to [id number] Call back number: [phone]   Reason for call:  Patient would like discuss her recent imaging results, patient states back pain has no improved, in need of clinical advice.

## 2015-12-04 NOTE — Telephone Encounter (Signed)
LMOM informing Pt of Dr. Larose Kells recommendations. Instructed her to call if questions or concerns.

## 2015-12-04 NOTE — Telephone Encounter (Signed)
Orthopedic note reviewed, she has pathological vertebral  fractures, surgery was not recommended, they did recommend a brace. Most importantly needs further evaluation to rule out metastatic disease. Advise patient:  Recommend to proceed with further evaluation for her fractures, once etiology  is determined she may get a more targeted treatment  (XRT?)  We can discuss more when she comes back. She was prescribed Tylenol No. 3 by orthopedic surgery, if that is not working we can try hydrocodone. Let me know

## 2015-12-08 ENCOUNTER — Encounter: Payer: Self-pay | Admitting: Internal Medicine

## 2015-12-08 ENCOUNTER — Ambulatory Visit (INDEPENDENT_AMBULATORY_CARE_PROVIDER_SITE_OTHER): Payer: Medicare Other | Admitting: Internal Medicine

## 2015-12-08 VITALS — BP 122/76 | HR 88 | Temp 97.5°F | Ht 61.0 in | Wt 162.0 lb

## 2015-12-08 DIAGNOSIS — M8440XD Pathological fracture, unspecified site, subsequent encounter for fracture with routine healing: Secondary | ICD-10-CM | POA: Diagnosis not present

## 2015-12-08 DIAGNOSIS — R600 Localized edema: Secondary | ICD-10-CM | POA: Diagnosis not present

## 2015-12-08 MED ORDER — FUROSEMIDE 20 MG PO TABS: 20.0000 mg | ORAL_TABLET | Freq: Every day | ORAL | Status: DC

## 2015-12-08 MED ORDER — BETAMETHASONE DIPROPIONATE AUG 0.05 % EX CREA: TOPICAL_CREAM | Freq: Two times a day (BID) | CUTANEOUS | Status: DC

## 2015-12-08 MED ORDER — POTASSIUM CHLORIDE CRYS ER 10 MEQ PO TBCR: 10.0000 meq | EXTENDED_RELEASE_TABLET | Freq: Every day | ORAL | Status: DC

## 2015-12-08 NOTE — Progress Notes (Signed)
Pre visit review using our clinic review tool, if applicable. No additional management support is needed unless otherwise documented below in the visit note. 

## 2015-12-08 NOTE — Patient Instructions (Signed)
GO TO THE LAB : Get the blood work     GO TO THE FRONT DESK Schedule your next appointment for a  routine checkup in 3 weeks, please come back with Mikayla Marks   For  swelling:  Leg elevation  Furosemide and potassium: 1 tablet of each every morning  Apply the cream twice a day as needed  Call anytime if you feel worse

## 2015-12-08 NOTE — Progress Notes (Signed)
Subjective:    Patient ID: Mikayla Marks, female    DOB: 06/01/1928, 82 y.o.   MRN: [id number]  DOS:  12/08/2015 Type of visit - description : her w/ brother Mikayla Marks Interval history: Patient is here for follow-up with his brother recheck. Her main concern is lower extremity edema. This is a ongoing problem, worse lately. Reports some pain with the swelling. Since she is here, we also talk about her pathological vertebral fractures. Was not aware of all the details of the C2 induration. I explained him again that further evaluation is needed, there is a potential for the lesions to be malignant. She continue with the ill-defined back pain with some radiation to the right lateral chest.   Review of Systems no fever chills No difficulty breathing or cough.  Past Medical History  Diagnosis Date  . HYPERTENSION   . Hyperlipidemia   . Schizoaffective disorder ([place])   . Osteoporosis   . SCC (squamous cell carcinoma)     in situ R pretibial area    Past Surgical History  Procedure Laterality Date  . Cataract extraction    . Cholecystectomy    . Partial hysterectomy      due to bleeding  . Tonsillectomy    . Breast biopsy      benign tumor  . Laser ablation Left     left LE   . Leg surgery Right 10/2012    Ruptured Veins    Social History   Social History  . Marital Status: Single    Spouse Name: N/A  . Number of Children: 3  . Years of Education: N/A   Occupational History  . not working    Social History Main Topics  . Smoking status: Never Smoker   . Smokeless tobacco: Never Used  . Alcohol Use: No  . Drug Use: No  . Sexual Activity: Not on file   Other Topics Concern  . Not on file   Social History Narrative   Daughter lives with her [name])     still drives   widow   Brother Mikayla Marks in [place], lives 5 min away [phone]              Medication List       This list is accurate as of: 12/08/15  3:14 PM.  Always use your most recent med  list.               acetaminophen-codeine 300-30 MG tablet  Commonly known as:  TYLENOL #3  Take 1-2 tablets by mouth every 4 (four) hours as needed for moderate pain.     CALTRATE 600 PO  Take by mouth. Reported on 12/08/2015     cycloSPORINE 0.05 % ophthalmic emulsion  Commonly known as:  RESTASIS  Place 1 drop into both eyes 2 (two) times daily.     ezetimibe 10 MG tablet  Commonly known as:  ZETIA  Take 1 tablet (10 mg total) by mouth at bedtime.     losartan-hydrochlorothiazide 50-12.5 MG tablet  Commonly known as:  HYZAAR  Take 1 tablet by mouth daily.     multivitamin with minerals tablet  Take 1 tablet by mouth daily.           Objective:   Physical Exam BP 122/76 mmHg  Pulse 88  Temp(Src) 97.5 F (36.4 C) (Oral)  Ht 5\' 1"  (1.549 m)  Wt 162 lb (73.483 kg)  BMI 30.63 kg/m2  SpO2 97% General:  Well developed, well nourished . NAD.  HEENT:  Normocephalic . Face symmetric, atraumatic Lungs:  CTA B Normal respiratory effort, no intercostal retractions, no accessory muscle use. Heart: RRR,  no murmur.  Able to check for JVD. Patient could not lay down in bed +/+++ pretibial pitting edema bilaterally , slightly worse than at previous visit. + Erythema/is scaliness but no warmness distal from the calves, worse on the left   MSK: Quite significant kyphosis, no TTP Skin: Not pale. Not jaundice Neurologic:  alert & oriented X3. psych  No anxious or depressed appearing.      Assessment & Plan:   Assessment HTN Hyperlipidemia Schizophrenia Osteoporosis : Per DEXA 2010, DEXA 2013 with Dr. Amalia Hailey: Osteopenia. Back pain, pathological fractures: back pain started during a trip to [place], MRI in [place]>> path fracture T8T9 Vitamin D deficiency Varicose pain, lower extremity edema: [place] (-) DVT 08-2014 SCC, R pretibial  PLAN Kyphosis, back pain, pathological fracture: Potential for the fracture being from a malignancy discussed with the patient and her  brother who was until now not aware of the issue in detail. They agreed on further eval. Plan: SPEP,  will discuss with ortho next steps Addendum: d/w ortho, rhe rec a referral to oncology Dr Marin Olp and Int rad for possible bx . Will arrange (d/w Richard) Lower extremity edema: Symmetric, doubt DVT. Some evidence of statis  dermatitis. Recommend Lasix 20 MG is morning with KCl 10. Leg elevation, hydrocortisone. See instructions and prescriptions RTC 3 weeks with her brother

## 2015-12-10 LAB — PROTEIN ELECTROPHORESIS, SERUM
ALBUMIN ELP: 3.7 g/dL — AB (ref 3.8–4.8)
ALPHA-1-GLOBULIN: 0.4 g/dL — AB (ref 0.2–0.3)
Alpha-2-Globulin: 0.9 g/dL (ref 0.5–0.9)
BETA 2: 0.6 g/dL — AB (ref 0.2–0.5)
BETA GLOBULIN: 0.5 g/dL (ref 0.4–0.6)
GAMMA GLOBULIN: 0.7 g/dL — AB (ref 0.8–1.7)
TOTAL PROTEIN, SERUM ELECTROPHOR: 6.8 g/dL (ref 6.1–8.1)

## 2015-12-14 ENCOUNTER — Inpatient Hospital Stay: Payer: Medicare Other | Admitting: Internal Medicine

## 2015-12-14 NOTE — Assessment & Plan Note (Signed)
Kyphosis, back pain, pathological fracture: Potential for the fracture being from a malignancy discussed with the patient and her brother who was until now not aware of the issue in detail. They agreed on further eval. Plan: SPEP,  will discuss with ortho next steps Addendum: d/w ortho, rhe rec a referral to oncology Dr Marin Olp and Int rad for possible bx . Will arrange (d/w Richard) Lower extremity edema: Symmetric, doubt DVT. Some evidence of statis  dermatitis. Recommend Lasix 20 MG is morning with KCl 10. Leg elevation, hydrocortisone. See instructions and prescriptions RTC 3 weeks with her brother

## 2015-12-15 ENCOUNTER — Telehealth: Payer: Self-pay | Admitting: Internal Medicine

## 2015-12-15 NOTE — Telephone Encounter (Signed)
Please advise 

## 2015-12-15 NOTE — Telephone Encounter (Signed)
Labs were okay.  Advise patient that I spoke with Dr. Othelia Pulling  we agreed that since she is willing to proceed with further evaluation,the right thing to do was to send her to Hematology (Dr Marin Olp) and to interventional radiology for consideration for biopsy. (I did notify her brother about the referrals)

## 2015-12-15 NOTE — Telephone Encounter (Signed)
°  Relationship to patient: Self  Can be reached: [phone]    Reason for call: Patient states she needs a call back about her recent labs and why she needs to be seen on 3rd floor. States she got a call about making an appointment but she has not received her lab results.

## 2015-12-15 NOTE — Telephone Encounter (Signed)
Spoke w/ Mikayla Marks, informed her of below. Mikayla Marks agreed to see Dr. Marin Olp now that she understands. Hematology/Oncology number given for her to call and schedule appt. Mikayla Marks's daughter requesting lab results be printed and mailed. Informed her I would place in mail today.

## 2015-12-31 ENCOUNTER — Telehealth: Payer: Self-pay | Admitting: Hematology & Oncology
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
## 2016-01-04 ENCOUNTER — Telehealth: Payer: Self-pay

## 2016-01-04 ENCOUNTER — Ambulatory Visit: Payer: Medicare Other | Admitting: Internal Medicine

## 2016-01-04 NOTE — Telephone Encounter (Signed)
Spoke w/ Pt's brother, [name], he informed me that Pt has expressed her concerns to him and is afraid of seeing oncologist/hematologist because she is afraid of what the diagnosis maybe and he doesn't think he would be able to make her go to the appointment if he scheduled the appointment. He informed me that she has only used Lasix and the Betamethasone cream once since she was seen on 12/08/2015 because she states that the people (he is unsure of who the people are) have poisoned the medication and that the cream caused her skin to turn black and "rot off." He has checked her skin and has not seen any black or rotting skin. I informed Richard that I would inform Dr. Larose Kells. Richard verbalized understanding.

## 2016-01-04 NOTE — Telephone Encounter (Signed)
LMOM for Mikayla Marks, Pt's brother to return call and his convenience.

## 2016-01-05 ENCOUNTER — Telehealth (HOSPITAL_COMMUNITY): Payer: Self-pay

## 2016-01-05 NOTE — Telephone Encounter (Signed)
Called to schedule, left vm for pt to return call. AW

## 2016-01-05 NOTE — Telephone Encounter (Signed)
Spoke with [name], the patient's brother, given her mental status, we could commit  her to treatment.  The patient has a son   Aleighia Gera MD, he lives in [place]. I advised Richard to contact Dr Deedra Ehrich and see what they decide to do as a family. I'll be happy to talk with Dr. Deedra Ehrich at anytime.

## 2016-01-08 ENCOUNTER — Telehealth: Payer: Self-pay | Admitting: Internal Medicine

## 2016-01-08 NOTE — Telephone Encounter (Signed)
Spoke w/ the patient's son Dr. Deedra Ehrich, a practicing physician. [phone] Explained that she has a vertebral fracture, standard of care is to proceed with a biopsy, patient is so far refusing. Since the since she has schizophrenia, I like you to reach out to the family, make them aware of the situation, if they decide that proceed with further evaluation is in her best interest then probably will have to commit her other otherwise will proceed with observation. I understand that further evaluation without the patient cooperation will be challenging  ( he agrees with me). He states that he already had extensive discussion with the patient, he doubts she would like to proceed with further eval. He will contact me anytime in the near future as needed.

## 2016-01-21 ENCOUNTER — Telehealth: Payer: Self-pay

## 2016-01-21 MED ORDER — ACETAMINOPHEN-CODEINE #3 300-30 MG PO TABS: 1.0000 | ORAL_TABLET | Freq: Three times a day (TID) | ORAL | 0 refills | Status: DC | PRN

## 2016-01-21 NOTE — Telephone Encounter (Signed)
Patient requesting a refill on Acetaminophen-Codeine (Tylenol) patient is aware he did not prescribe but she asking will you fill it pharmacy is [facility] [phone]  [phone]

## 2016-01-21 NOTE — Telephone Encounter (Signed)
She does have a fracture, I'm okay refilling it x 1 . See prescription

## 2016-01-21 NOTE — Telephone Encounter (Signed)
Please advise 

## 2016-01-21 NOTE — Telephone Encounter (Signed)
Spoke w/ Pt, informed her that Rx has been placed at front desk for pick up (informed unable to send directly to pharmacy since medication has codeine). Pt verbalized understanding and stated her daughter would pick up.

## 2016-02-03 ENCOUNTER — Other Ambulatory Visit: Payer: Self-pay | Admitting: Internal Medicine

## 2016-02-04 NOTE — Telephone Encounter (Signed)
Rx printed, awaiting MD signature.  

## 2016-02-04 NOTE — Telephone Encounter (Signed)
Pt's daughter called back in to f/u on previous message. Informed of the below per PCP. She says that pt is unable to come in due to her having back pain.    Please call daughter back to advise. Please give her a call back to discuss directly   Thanks.   [phone]

## 2016-02-04 NOTE — Telephone Encounter (Signed)
Ok #30, no RF 

## 2016-02-04 NOTE — Telephone Encounter (Signed)
Please advise 

## 2016-02-04 NOTE — Telephone Encounter (Signed)
Pt is requesting refill on Tylenol #3.   Last OV: 12/08/2015 Last Fill: 01/21/2016 #30 and 0RF (Pt sig: 1 tab q8h prn) UDS: None  Please advise.

## 2016-02-04 NOTE — Telephone Encounter (Signed)
Pt's daughter called in. She would like to know if PCP could provide 3 refills on Rx?

## 2016-02-04 NOTE — Telephone Encounter (Signed)
Tried calling Pt to inform her that Rx has been placed at front desk for pick up at her convenience, no answer, and unable to leave message.

## 2016-02-04 NOTE — Telephone Encounter (Signed)
She really needs to come to the office to discuss pain management, I don't think she has a UDS or contracted.

## 2016-02-05 MED ORDER — ACETAMINOPHEN-CODEINE #3 300-30 MG PO TABS: 1.0000 | ORAL_TABLET | Freq: Three times a day (TID) | ORAL | 0 refills | Status: DC | PRN
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
## 2016-02-05 NOTE — Telephone Encounter (Signed)
Unfortunately, the disrupted behavior of the patient's daughter may lead to her  dismissal from this office

## 2016-02-05 NOTE — Addendum Note (Signed)
Addended byDamita Dunnings D on: 02/05/2016 03:37 PM   Modules accepted: Orders

## 2016-02-05 NOTE — Telephone Encounter (Signed)
Spoke w/ Pt's daughter, [name], informed her that both Dr. Larose Kells and I were in clinic yesterday seeing Pt's as well as today, that I tried to call and inform them that the Rx was ready for pick up, however, no answer at telephone number in chart and no voicemail to leave message as well as I left yesterday at 2:45 PM for a personal appt. Daughter interrupting several times and informed her I could not help if she would interrupt me. She states that her mother is also a patient of Dr. Larose Kells which I agreed but informed her that we also have other patients as well. She informed me that pharmacy still does not have Rx, informed her that again, Tylenol #3 which has codeine (daughter informed several weeks ago that codeine products have to be picked up and taken to pharmacy), she informed me I am "nut-so" and that she spoke w/ pharmacist and that the Rx can be faxed to them. Again informed her that we do not have the controlled substance electronic capability to send codeine. Call finally forwarded to PCP.

## 2016-02-05 NOTE — Telephone Encounter (Signed)
Unable to do a more comprehensive pain management plan. Apparently current medication is working Refill Tylenol #3 every 8 hours ------#40, no refills

## 2016-02-22 ENCOUNTER — Telehealth: Payer: Self-pay | Admitting: Internal Medicine

## 2016-02-22 DIAGNOSIS — M8448XS Pathological fracture, other site, sequela: Secondary | ICD-10-CM

## 2016-02-22 NOTE — Telephone Encounter (Signed)
Okay #40, no refill Okay   prescription for wheelchair. DX vertebral fracture

## 2016-02-22 NOTE — Telephone Encounter (Signed)
Caller name: [name]  Relationship to patient: Daughter  Can be reached: [phone]    Reason for call: Request refill on acetaminophen-codeine (TYLENOL #3) 300-30 MG tablet [id number]    Also would like Rx for non electric wheelchair

## 2016-02-22 NOTE — Telephone Encounter (Signed)
Please advise, last fill 02/05/2016 #30 and 0RF.

## 2016-02-23 MED ORDER — ACETAMINOPHEN-CODEINE #3 300-30 MG PO TABS: 1.0000 | ORAL_TABLET | Freq: Three times a day (TID) | ORAL | 0 refills | Status: DC | PRN

## 2016-02-23 NOTE — Telephone Encounter (Signed)
Daughter informed

## 2016-02-23 NOTE — Telephone Encounter (Signed)
Please inform Pt and/or her daughter that Rx and Rx for wheelchair have been placed at front desk for pick up at her convenience. Thank you.

## 2016-02-23 NOTE — Telephone Encounter (Signed)
Rx for Tylenol #3 and Wheelchair printed, awaiting MD signature.

## 2016-03-03 ENCOUNTER — Telehealth: Payer: Self-pay | Admitting: Internal Medicine

## 2016-03-03 NOTE — Telephone Encounter (Signed)
Relation to pt: [name],Mikayla Marks / daughter  Call back Silas: [place], [place] [phone] (Phone) [phone] (Fax)     Reason for call:  Daughter requesting a refill acetaminophen-codeine (TYLENOL #3) 300-30 MG tablet

## 2016-03-04 MED ORDER — ACETAMINOPHEN-CODEINE #3 300-30 MG PO TABS: 1.0000 | ORAL_TABLET | Freq: Three times a day (TID) | ORAL | 0 refills | Status: DC | PRN
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
## 2016-03-04 NOTE — Addendum Note (Signed)
Addended by: Debbrah Alar on: 03/04/2016 03:32 PM   Modules accepted: Orders

## 2016-03-04 NOTE — Telephone Encounter (Signed)
Reviewed controlled substance registry. Was last filled on 8/29.  See rx.

## 2016-03-04 NOTE — Telephone Encounter (Signed)
Pt was given 40 tabs on 02/23/2016. If she is needing further tabs or using more frequently Pt will need an OV.

## 2016-03-04 NOTE — Telephone Encounter (Signed)
Pt's daughter called back in to follow up on Rx. Placed her on hold to be advised and was disconnected. Advised by providers CMA pt just received a refill for medication on 8/29 , if pt is completely out of meds pt would need a OV to be seen. She is unable to provide a refill for medication.    Called pt's daughter back [name]) to advise, phone rang, no vm set up to leave a voicemail.    CB: [phone]

## 2016-03-23 ENCOUNTER — Telehealth: Payer: Self-pay | Admitting: Internal Medicine

## 2016-03-23 MED ORDER — ACETAMINOPHEN-CODEINE #3 300-30 MG PO TABS: 1.0000 | ORAL_TABLET | Freq: Three times a day (TID) | ORAL | 0 refills | Status: DC | PRN

## 2016-03-23 NOTE — Telephone Encounter (Signed)
Okay #40, no refills 

## 2016-03-23 NOTE — Telephone Encounter (Signed)
Rx printed, awaiting MD signature.  

## 2016-03-23 NOTE — Telephone Encounter (Signed)
Patient's daughter is requesting a refill of acetaminophen-codeine (TYLENOL #3) 300-30 MG tablet for the patient   Contact: [name] Patient Relation: Daughter Patient phone: [phone]

## 2016-03-23 NOTE — Addendum Note (Signed)
Addended byDamita Dunnings D on: 03/23/2016 05:13 PM   Modules accepted: Orders

## 2016-03-23 NOTE — Telephone Encounter (Signed)
Pt's daughter, [name], requesting refill on Tylenol #3 for Pt.  Last OV: 12/08/2015 Last Fill: 03/04/2016 #40 and 0RF by Lenna Sciara   Please advise.

## 2016-03-24 NOTE — Telephone Encounter (Signed)
Patient's daughter [name] is aware that the Rx is ready for pick up at the front desk. She said she will discuss a good time to make an appointment for her as well.

## 2016-03-24 NOTE — Telephone Encounter (Signed)
Please inform Pt that Rx has been placed at front desk for pick up. Also, she needs appt w/ PCP before next refill (30 mins). Thank you.

## 2016-04-02 ENCOUNTER — Emergency Department (HOSPITAL_COMMUNITY): Payer: Medicare Other

## 2016-04-02 ENCOUNTER — Inpatient Hospital Stay (HOSPITAL_COMMUNITY)
Admission: EM | Admit: 2016-04-02 | Discharge: 2016-04-12 | DRG: 841 | Disposition: A | Discharge status: Skilled Nursing Facility | Payer: Medicare Other | Attending: Internal Medicine | Admitting: Internal Medicine

## 2016-04-02 ENCOUNTER — Encounter (HOSPITAL_COMMUNITY): Payer: Self-pay | Admitting: Vascular Surgery

## 2016-04-02 DIAGNOSIS — C9030 Solitary plasmacytoma not having achieved remission: Principal | ICD-10-CM | POA: Diagnosis present

## 2016-04-02 DIAGNOSIS — Z66 Do not resuscitate: Secondary | ICD-10-CM | POA: Diagnosis present

## 2016-04-02 DIAGNOSIS — C801 Malignant (primary) neoplasm, unspecified: Secondary | ICD-10-CM | POA: Diagnosis not present

## 2016-04-02 DIAGNOSIS — M549 Dorsalgia, unspecified: Secondary | ICD-10-CM | POA: Diagnosis present

## 2016-04-02 DIAGNOSIS — Z515 Encounter for palliative care: Secondary | ICD-10-CM | POA: Diagnosis not present

## 2016-04-02 DIAGNOSIS — C7989 Secondary malignant neoplasm of other specified sites: Secondary | ICD-10-CM | POA: Diagnosis not present

## 2016-04-02 DIAGNOSIS — F203 Undifferentiated schizophrenia: Secondary | ICD-10-CM

## 2016-04-02 DIAGNOSIS — F200 Paranoid schizophrenia: Secondary | ICD-10-CM | POA: Diagnosis present

## 2016-04-02 DIAGNOSIS — I4581 Long QT syndrome: Secondary | ICD-10-CM | POA: Diagnosis not present

## 2016-04-02 DIAGNOSIS — R5383 Other fatigue: Secondary | ICD-10-CM | POA: Diagnosis present

## 2016-04-02 DIAGNOSIS — I10 Essential (primary) hypertension: Secondary | ICD-10-CM | POA: Diagnosis not present

## 2016-04-02 DIAGNOSIS — M8448XA Pathological fracture, other site, initial encounter for fracture: Secondary | ICD-10-CM | POA: Diagnosis not present

## 2016-04-02 DIAGNOSIS — R0789 Other chest pain: Secondary | ICD-10-CM | POA: Diagnosis not present

## 2016-04-02 DIAGNOSIS — E785 Hyperlipidemia, unspecified: Secondary | ICD-10-CM | POA: Diagnosis present

## 2016-04-02 DIAGNOSIS — Z880 Allergy status to penicillin: Secondary | ICD-10-CM

## 2016-04-02 DIAGNOSIS — R279 Unspecified lack of coordination: Secondary | ICD-10-CM | POA: Diagnosis present

## 2016-04-02 DIAGNOSIS — M546 Pain in thoracic spine: Secondary | ICD-10-CM | POA: Diagnosis not present

## 2016-04-02 DIAGNOSIS — R9431 Abnormal electrocardiogram [ECG] [EKG]: Secondary | ICD-10-CM | POA: Diagnosis present

## 2016-04-02 DIAGNOSIS — R079 Chest pain, unspecified: Secondary | ICD-10-CM | POA: Diagnosis not present

## 2016-04-02 DIAGNOSIS — J90 Pleural effusion, not elsewhere classified: Secondary | ICD-10-CM | POA: Diagnosis not present

## 2016-04-02 DIAGNOSIS — M4804 Spinal stenosis, thoracic region: Secondary | ICD-10-CM | POA: Diagnosis not present

## 2016-04-02 DIAGNOSIS — M40209 Unspecified kyphosis, site unspecified: Secondary | ICD-10-CM | POA: Diagnosis present

## 2016-04-02 DIAGNOSIS — R222 Localized swelling, mass and lump, trunk: Secondary | ICD-10-CM

## 2016-04-02 DIAGNOSIS — M898X8 Other specified disorders of bone, other site: Secondary | ICD-10-CM | POA: Diagnosis not present

## 2016-04-02 DIAGNOSIS — Z23 Encounter for immunization: Secondary | ICD-10-CM

## 2016-04-02 DIAGNOSIS — M810 Age-related osteoporosis without current pathological fracture: Secondary | ICD-10-CM | POA: Diagnosis present

## 2016-04-02 DIAGNOSIS — Z85828 Personal history of other malignant neoplasm of skin: Secondary | ICD-10-CM

## 2016-04-02 DIAGNOSIS — N2889 Other specified disorders of kidney and ureter: Secondary | ICD-10-CM | POA: Diagnosis not present

## 2016-04-02 DIAGNOSIS — F209 Schizophrenia, unspecified: Secondary | ICD-10-CM | POA: Diagnosis present

## 2016-04-02 DIAGNOSIS — C9000 Multiple myeloma not having achieved remission: Secondary | ICD-10-CM

## 2016-04-02 DIAGNOSIS — D492 Neoplasm of unspecified behavior of bone, soft tissue, and skin: Secondary | ICD-10-CM

## 2016-04-02 DIAGNOSIS — G9520 Unspecified cord compression: Secondary | ICD-10-CM

## 2016-04-02 DIAGNOSIS — Z888 Allergy status to other drugs, medicaments and biological substances status: Secondary | ICD-10-CM

## 2016-04-02 DIAGNOSIS — R51 Headache: Secondary | ICD-10-CM | POA: Diagnosis not present

## 2016-04-02 DIAGNOSIS — G894 Chronic pain syndrome: Secondary | ICD-10-CM | POA: Diagnosis not present

## 2016-04-02 DIAGNOSIS — Z79899 Other long term (current) drug therapy: Secondary | ICD-10-CM

## 2016-04-02 DIAGNOSIS — M489 Spondylopathy, unspecified: Secondary | ICD-10-CM

## 2016-04-02 LAB — CBC WITH DIFFERENTIAL/PLATELET
BASOS PCT: 0 %
Basophils Absolute: 0 10*3/uL (ref 0.0–0.1)
EOS ABS: 0.1 10*3/uL (ref 0.0–0.7)
EOS PCT: 1 %
HCT: 42.1 % (ref 36.0–46.0)
Hemoglobin: 13.9 g/dL (ref 12.0–15.0)
LYMPHS ABS: 1 10*3/uL (ref 0.7–4.0)
Lymphocytes Relative: 14 %
MCH: 30.8 pg (ref 26.0–34.0)
MCHC: 33 g/dL (ref 30.0–36.0)
MCV: 93.1 fL (ref 78.0–100.0)
MONOS PCT: 7 %
Monocytes Absolute: 0.5 10*3/uL (ref 0.1–1.0)
NEUTROS PCT: 78 %
Neutro Abs: 5.5 10*3/uL (ref 1.7–7.7)
PLATELETS: 317 10*3/uL (ref 150–400)
RBC: 4.52 MIL/uL (ref 3.87–5.11)
RDW: 13.9 % (ref 11.5–15.5)
WBC: 7 10*3/uL (ref 4.0–10.5)

## 2016-04-02 LAB — COMPREHENSIVE METABOLIC PANEL
ALBUMIN: 4 g/dL (ref 3.5–5.0)
ALT: 19 U/L (ref 14–54)
ANION GAP: 11 (ref 5–15)
AST: 22 U/L (ref 15–41)
Alkaline Phosphatase: 55 U/L (ref 38–126)
BUN: 12 mg/dL (ref 6–20)
CHLORIDE: 103 mmol/L (ref 101–111)
CO2: 24 mmol/L (ref 22–32)
Calcium: 9.9 mg/dL (ref 8.9–10.3)
Creatinine, Ser: 0.87 mg/dL (ref 0.44–1.00)
GFR calc Af Amer: >60 mL/min (ref >60)
GFR calc non Af Amer: 59 mL/min — ABNORMAL LOW (ref >60)
GLUCOSE: 109 mg/dL — AB (ref 65–99)
POTASSIUM: 3.8 mmol/L (ref 3.5–5.1)
Sodium: 138 mmol/L (ref 135–145)
Total Bilirubin: 0.7 mg/dL (ref 0.3–1.2)
Total Protein: 7 g/dL (ref 6.5–8.1)

## 2016-04-02 LAB — URINE MICROSCOPIC-ADD ON

## 2016-04-02 LAB — I-STAT TROPONIN, ED: TROPONIN I, POC: 0 ng/mL (ref 0.00–0.08)

## 2016-04-02 LAB — URINALYSIS, ROUTINE W REFLEX MICROSCOPIC
BILIRUBIN URINE: NEGATIVE
Glucose, UA: NEGATIVE mg/dL
KETONES UR: 15 mg/dL — AB
LEUKOCYTES UA: NEGATIVE
NITRITE: NEGATIVE
PH: 6 (ref 5.0–8.0)
Protein, ur: NEGATIVE mg/dL
SPECIFIC GRAVITY, URINE: 1.01 (ref 1.005–1.030)

## 2016-04-02 LAB — PROTIME-INR
INR: 1.03
Prothrombin Time: 13.5 seconds (ref 11.4–15.2)

## 2016-04-02 LAB — I-STAT CG4 LACTIC ACID, ED: LACTIC ACID, VENOUS: 1.28 mmol/L (ref 0.5–1.9)

## 2016-04-02 LAB — LIPASE, BLOOD: Lipase: 31 U/L (ref 11–51)

## 2016-04-02 LAB — BRAIN NATRIURETIC PEPTIDE: B NATRIURETIC PEPTIDE 5: 72.5 pg/mL (ref 0.0–100.0)

## 2016-04-02 MED ORDER — IOPAMIDOL (ISOVUE-300) INJECTION 61%
INTRAVENOUS | Status: AC
  Filled 2016-04-02: qty 100

## 2016-04-02 MED ORDER — OXYCODONE HCL 5 MG PO TABS
5.0000 mg | ORAL_TABLET | ORAL | Status: DC | PRN
  Administered 2016-04-03 – 2016-04-10 (×7): 5 mg via ORAL
  Filled 2016-04-02 (×7): qty 1

## 2016-04-02 MED ORDER — IOPAMIDOL (ISOVUE-300) INJECTION 61%
INTRAVENOUS | Status: AC
  Filled 2016-04-02: qty 30

## 2016-04-02 MED ORDER — SENNA 8.6 MG PO TABS
1.0000 | ORAL_TABLET | Freq: Two times a day (BID) | ORAL | Status: DC
  Administered 2016-04-03 – 2016-04-12 (×18): 8.6 mg via ORAL
  Filled 2016-04-02 (×19): qty 1

## 2016-04-02 MED ORDER — ACETAMINOPHEN 650 MG RE SUPP: 650.0000 mg | Freq: Four times a day (QID) | RECTAL | Status: DC | PRN

## 2016-04-02 MED ORDER — MORPHINE SULFATE (PF) 4 MG/ML IV SOLN
4.0000 mg | Freq: Once | INTRAVENOUS | Status: AC
  Administered 2016-04-02: 4 mg via INTRAVENOUS
  Filled 2016-04-02: qty 1

## 2016-04-02 MED ORDER — ACETAMINOPHEN 325 MG PO TABS
650.0000 mg | ORAL_TABLET | Freq: Four times a day (QID) | ORAL | Status: DC | PRN
Start: 2016-04-02 — End: 2016-04-12

## 2016-04-02 MED ORDER — MORPHINE SULFATE (PF) 4 MG/ML IV SOLN
4.0000 mg | INTRAVENOUS | Status: DC | PRN
  Administered 2016-04-02 – 2016-04-06 (×5): 4 mg via INTRAVENOUS
  Filled 2016-04-02 (×5): qty 1

## 2016-04-02 MED ORDER — MORPHINE SULFATE (PF) 4 MG/ML IV SOLN: 4.0000 mg | Freq: Once | INTRAVENOUS | Status: DC

## 2016-04-02 MED ORDER — POLYETHYLENE GLYCOL 3350 17 G PO PACK: 17.0000 g | PACK | Freq: Every day | ORAL | Status: DC | PRN

## 2016-04-02 MED ORDER — DOCUSATE SODIUM 100 MG PO CAPS
100.0000 mg | ORAL_CAPSULE | Freq: Two times a day (BID) | ORAL | Status: DC
  Administered 2016-04-03 – 2016-04-12 (×18): 100 mg via ORAL
  Filled 2016-04-02 (×19): qty 1

## 2016-04-02 MED ORDER — TIZANIDINE HCL 2 MG PO TABS: 4.0000 mg | ORAL_TABLET | Freq: Every day | ORAL | Status: DC | PRN

## 2016-04-02 NOTE — Consult Note (Signed)
Reason for Consult:thoracic spine mass Referring Physician: EDP  MARCENA MATCHETT is an 82 y.o. female.   HPI:  82 yo female with known T8T9 mass seen on MRI in April, seen by [facility] and referred to oncology but failed follow up. Presented tonight with severe thoracic back pain with rib pain. No change in gait or NT legs. Son is an OB doc, and there is a note about non-aggressive care. CT showed severe kyphosis with large mass replacing most of T8 and T9.  Past Medical History:  Diagnosis Date  . Hyperlipidemia   . HYPERTENSION   . Osteoporosis   . SCC (squamous cell carcinoma)    in situ R pretibial area  . Schizoaffective disorder [facility])     Past Surgical History:  Procedure Laterality Date  . BREAST BIOPSY     benign tumor  . CATARACT EXTRACTION    . CHOLECYSTECTOMY    . LASER ABLATION Left    left LE   . LEG SURGERY Right 10/2012   Ruptured Veins  . PARTIAL HYSTERECTOMY     due to bleeding  . TONSILLECTOMY      Allergies  Allergen Reactions  . Other Other (See Comments)    5FU cream - stroke like symptoms  . Bacitracin-Polymyxin B Other (See Comments)    Reaction unknown.   Marland Kitchen Penicillins Rash    Social History  Substance Use Topics  . Smoking status: Never Smoker  . Smokeless tobacco: Never Used  . Alcohol use No    Family History  Problem Relation Age of Onset  . Colon cancer Neg Hx   . Breast cancer Neg Hx   . CAD Neg Hx      Review of Systems  Positive ROS: neg  All other systems have been reviewed and were otherwise negative with the exception of those mentioned in the HPI and as above.  Objective: Vital signs in last 24 hours: Temp:  [98.3 F (36.8 C)] 98.3 F (36.8 C) (10/07 1401) Pulse Rate:  [86-102] 93 (10/07 1945) Resp:  [16-20] 20 (10/07 1600) BP: (154-184)/(67-93) 154/68 (10/07 1945) SpO2:  [95 %-97 %] 95 % (10/07 1945) Weight:  [73 kg (161 lb)] 73 kg (161 lb) (10/07 1404)  General Appearance: Alert, cooperative, no distress,  appears stated age Head: Normocephalic, without obvious abnormality, atraumatic Lungs:  respirations unlabored Heart: Regular rate and rhythm Skin: erythema BLE   NEUROLOGIC:   Mental status: A&O x4, no aphasia, good attention span, Memory and fund of knowledge seem ok Motor Exam - grossly normal, normal tone and bulk Sensory Exam - grossly normal Reflexes: decreased at knees Coordination - not tested Gait - not tested Balance - not tested Cranial Nerves: I: smell Not tested  II: visual acuity  OS: na    OD:  II: visual fields   II: pupils   III,VII: ptosis   III,IV,VI: extraocular muscles    V: mastication   V: facial light touch sensation    V,VII: corneal reflex    VII: facial muscle function - upper    VII: facial muscle function - lower   VIII: hearing   IX: soft palate elevation    IX,X: gag reflex   XI: trapezius strength    XI: sternocleidomastoid strength   XI: neck flexion strength    XII: tongue strength      Data Review Lab Results  Component Value Date   WBC 7.0 04/02/2016   HGB 13.9 04/02/2016   HCT 42.1 04/02/2016  MCV 93.1 04/02/2016   PLT 317 04/02/2016   Lab Results  Component Value Date   NA 138 04/02/2016   K 3.8 04/02/2016   CL 103 04/02/2016   CO2 24 04/02/2016   BUN 12 04/02/2016   CREATININE 0.87 04/02/2016   GLUCOSE 109 (H) 04/02/2016   Lab Results  Component Value Date   INR 1.03 04/02/2016    Radiology: Dg Chest 2 View  Result Date: 04/02/2016 CLINICAL DATA:  Severe back pain.  Left-sided chest pain today. EXAM: CHEST  2 VIEW COMPARISON:  Chest radiograph 11/11/2015, spine MRI 10/15/2015 FINDINGS: The cardiac silhouette is enlarged. Mediastinal contours appear intact. There is no evidence of pneumothorax. There has been interval development of moderate in size left pleural effusion. Smaller right subpulmonic effusion is also possible. There is mild interstitial prominence bilaterally. If there is a significantly exaggerated  thoracic kyphosis centered in the second third of the thoracic spine. The thoracic vertebrae are not well evaluated due to osteopenia and positioning, but new or advanced compression fracture is suspected based on the exaggerated kyphotic deformity. IMPRESSION: Moderate in size left pleural effusion. Possible small right subpulmonic pleural effusion. Mild pulmonary vascular congestion. Significantly exaggerated thoracic kyphosis when compared to patient's prior chest radiograph and MRI,which is suggestive of new or worsening compression fracture of one or more thoracic vertebral bodies.The thoracic vertebrae are not well evaluated due to osteopenia and positioning. Patient has a known T8, T9, and T11 expansile lesions with associated pathologic fractures of T8 and T9 demonstrated by MRI in April 2017. Electronically Signed   By: Fidela Salisbury M.D.   On: 04/02/2016 17:42   Ct Head Wo Contrast  Result Date: 04/02/2016 CLINICAL DATA:  Severe back pain. Compression fracture diagnosed in April. Pain is worsening today. EXAM: CT HEAD WITHOUT CONTRAST CT CERVICAL SPINE WITHOUT CONTRAST TECHNIQUE: Multidetector CT imaging of the head and cervical spine was performed following the standard protocol without intravenous contrast. Multiplanar CT image reconstructions of the cervical spine were also generated. COMPARISON:  CT head 11/30/2006 FINDINGS: CT HEAD FINDINGS Brain: Mild diffuse cerebral atrophy. Ventricular dilatation consistent with central atrophy. Low-attenuation changes in the deep white matter consistent with small vessel ischemia. No evidence of acute infarction, hemorrhage, hydrocephalus, extra-axial collection or mass lesion/mass effect. Vascular: No hyperdense vessel or unexpected calcification. Skull: Normal. Negative for fracture or focal lesion. Sinuses/Orbits: No acute finding. Degenerative changes in the temporomandibular joints. Other: No significant changes since previous study. CT CERVICAL  SPINE FINDINGS Alignment: Slight anterior subluxation of C7 on T1 is likely degenerative. Otherwise normal alignment of the cervical spine. Skull base and vertebrae: No acute fracture. No primary bone lesion or focal pathologic process. Soft tissues and spinal canal: No prevertebral fluid or swelling. No visible canal hematoma. Disc levels: Degenerative changes throughout the cervical spine with narrowed interspaces and endplate hypertrophic changes. Degenerative disc disease at C5-6 and C6-7 levels. Prominent posterior osteophytes at C5-6 and C6-7 cause some impression upon the central canal. C1-2 articulation appears intact. Congenital nonunion of the posterior arch of C1. Degenerative changes throughout the cervical facet joints with posterior coalition at C4-5. Upper chest: There is a fracture of the posterior right first rib but associated with a mildly expansile lucent lesions suggesting a pathologic fracture. Appearance is suspicious for metastasis. Other: None. IMPRESSION: Diffuse degenerative changes throughout the cervical spine. No acute displaced cervical spine fractures identified. There is a pathologic fracture of the right posterior first rib. Electronically Signed   By: Lucienne Capers  M.D.   On: 04/02/2016 20:01   Ct Chest W Contrast  Result Date: 04/02/2016 CLINICAL DATA:  Pt reports to the ED for eval of back pain. Pt had some compression fx diagnosed in April. The pain got worse last night and into today. Pt able to move all extremities. Pt denies any numbness, tingling, paralysis, or bowel or bladder incontinence EXAM: CT CHEST, ABDOMEN, AND PELVIS WITH CONTRAST TECHNIQUE: Multidetector CT imaging of the chest, abdomen and pelvis was performed following the standard protocol during bolus administration of intravenous contrast. CONTRAST:  100 mL of Isovue-300 intravenous contrast COMPARISON:  Chest CT, 08/22/2014 FINDINGS: CT CHEST FINDINGS Cardiovascular: Heart is normal in size. There are  mild coronary artery calcifications. Great vessels normal in caliber. Mild atherosclerotic plaque is noted along the aortic arch and descending thoracic aorta. No significant stenosis. Mediastinum/Nodes: 14 mm nodule lies in the posterior left thyroid lobe, stable. No mediastinal or hilar masses or enlarged lymph nodes. Lungs/Pleura: Small left a minimal right pleural effusions. There is dependent opacity at the lung bases, most notably in the left lower lobe. This is likely atelectasis. Pneumonia is possible. There is no evidence of pulmonary edema. No lung mass or suspicious nodule. No pneumothorax. CT ABDOMEN PELVIS FINDINGS Hepatobiliary: Liver is unremarkable. There is intra and extra hepatic bile duct dilation. Common bile duct measures 12 mm in greatest dimension. No convincing duct stone. Gallbladder surgically absent. Pancreas: No pancreatic mass or inflammation. Mild dilation of the pancreatic duct. Spleen: Normal in size without focal abnormality. Adrenals/Urinary Tract: Mild bilateral renal cortical thinning. No renal masses or stones. No hydronephrosis. Ureters normal in course and in caliber. Bladder is unremarkable. Stomach/Bowel: Stomach and small bowel are unremarkable. Mild increased stool in the colon. No colonic wall thickening or inflammatory change. Appendix not visualized. No evidence of appendicitis. Vascular/Lymphatic: Mild atherosclerotic calcifications noted along a normal caliber abdominal aorta. No enlarged lymph nodes. Reproductive: Status post hysterectomy. No adnexal masses. Other: No abdominal wall hernia or abnormality. No abdominopelvic ascites. MUSCULOSKELETAL FINDINGS There is a destructive mass arising from the T8 and T9 vertebrae extending to involve the lower aspect of the T7 vertebra and the upper endplate of [id number] as well. Mass measures 4.1 x 5.1 x 4.9 cm in size. It extends posteriorly into the spinal canal to cause severe central spinal stenosis. It also extends into the  neural foramina, right greater than left causing significant narrowing. There are no other bone lesions. The bones are diffusely demineralized. IMPRESSION: 1. There is a destructive spinal mass centered on the T8 and T9 vertebrae, but also involving the lower aspect of the T7 vertebra and upper aspect of T10. The mass causes severe central stenosis and significant narrowing of the neural foramina. This mass may reflect a primary bone neoplasm such as a plasmacytoma. A metastatic lesion is possible although a primary neoplasm is not visualized. Followup thoracic spine MRI with and without contrast to assess the extent of cord compression is recommended. 2. No other bone lesions. 3. Small left and minimal right pleural effusions. There is associated dependent atelectasis most evident in the left lower lobe. 4. No acute abnormalities below the diaphragm. Electronically Signed   By: Lajean Manes M.D.   On: 04/02/2016 20:10   Ct Cervical Spine Wo Contrast  Result Date: 04/02/2016 CLINICAL DATA:  Severe back pain. Compression fracture diagnosed in April. Pain is worsening today. EXAM: CT HEAD WITHOUT CONTRAST CT CERVICAL SPINE WITHOUT CONTRAST TECHNIQUE: Multidetector CT imaging of the head and  cervical spine was performed following the standard protocol without intravenous contrast. Multiplanar CT image reconstructions of the cervical spine were also generated. COMPARISON:  CT head 11/30/2006 FINDINGS: CT HEAD FINDINGS Brain: Mild diffuse cerebral atrophy. Ventricular dilatation consistent with central atrophy. Low-attenuation changes in the deep white matter consistent with small vessel ischemia. No evidence of acute infarction, hemorrhage, hydrocephalus, extra-axial collection or mass lesion/mass effect. Vascular: No hyperdense vessel or unexpected calcification. Skull: Normal. Negative for fracture or focal lesion. Sinuses/Orbits: No acute finding. Degenerative changes in the temporomandibular joints. Other: No  significant changes since previous study. CT CERVICAL SPINE FINDINGS Alignment: Slight anterior subluxation of C7 on T1 is likely degenerative. Otherwise normal alignment of the cervical spine. Skull base and vertebrae: No acute fracture. No primary bone lesion or focal pathologic process. Soft tissues and spinal canal: No prevertebral fluid or swelling. No visible canal hematoma. Disc levels: Degenerative changes throughout the cervical spine with narrowed interspaces and endplate hypertrophic changes. Degenerative disc disease at C5-6 and C6-7 levels. Prominent posterior osteophytes at C5-6 and C6-7 cause some impression upon the central canal. C1-2 articulation appears intact. Congenital nonunion of the posterior arch of C1. Degenerative changes throughout the cervical facet joints with posterior coalition at C4-5. Upper chest: There is a fracture of the posterior right first rib but associated with a mildly expansile lucent lesions suggesting a pathologic fracture. Appearance is suspicious for metastasis. Other: None. IMPRESSION: Diffuse degenerative changes throughout the cervical spine. No acute displaced cervical spine fractures identified. There is a pathologic fracture of the right posterior first rib. Electronically Signed   By: Lucienne Capers M.D.   On: 04/02/2016 20:01   Ct Abdomen Pelvis W Contrast  Result Date: 04/02/2016 CLINICAL DATA:  Pt reports to the ED for eval of back pain. Pt had some compression fx diagnosed in April. The pain got worse last night and into today. Pt able to move all extremities. Pt denies any numbness, tingling, paralysis, or bowel or bladder incontinence EXAM: CT CHEST, ABDOMEN, AND PELVIS WITH CONTRAST TECHNIQUE: Multidetector CT imaging of the chest, abdomen and pelvis was performed following the standard protocol during bolus administration of intravenous contrast. CONTRAST:  100 mL of Isovue-300 intravenous contrast COMPARISON:  Chest CT, 08/22/2014 FINDINGS: CT  CHEST FINDINGS Cardiovascular: Heart is normal in size. There are mild coronary artery calcifications. Great vessels normal in caliber. Mild atherosclerotic plaque is noted along the aortic arch and descending thoracic aorta. No significant stenosis. Mediastinum/Nodes: 14 mm nodule lies in the posterior left thyroid lobe, stable. No mediastinal or hilar masses or enlarged lymph nodes. Lungs/Pleura: Small left a minimal right pleural effusions. There is dependent opacity at the lung bases, most notably in the left lower lobe. This is likely atelectasis. Pneumonia is possible. There is no evidence of pulmonary edema. No lung mass or suspicious nodule. No pneumothorax. CT ABDOMEN PELVIS FINDINGS Hepatobiliary: Liver is unremarkable. There is intra and extra hepatic bile duct dilation. Common bile duct measures 12 mm in greatest dimension. No convincing duct stone. Gallbladder surgically absent. Pancreas: No pancreatic mass or inflammation. Mild dilation of the pancreatic duct. Spleen: Normal in size without focal abnormality. Adrenals/Urinary Tract: Mild bilateral renal cortical thinning. No renal masses or stones. No hydronephrosis. Ureters normal in course and in caliber. Bladder is unremarkable. Stomach/Bowel: Stomach and small bowel are unremarkable. Mild increased stool in the colon. No colonic wall thickening or inflammatory change. Appendix not visualized. No evidence of appendicitis. Vascular/Lymphatic: Mild atherosclerotic calcifications noted along a normal caliber  abdominal aorta. No enlarged lymph nodes. Reproductive: Status post hysterectomy. No adnexal masses. Other: No abdominal wall hernia or abnormality. No abdominopelvic ascites. MUSCULOSKELETAL FINDINGS There is a destructive mass arising from the T8 and T9 vertebrae extending to involve the lower aspect of the T7 vertebra and the upper endplate of QA348G as well. Mass measures 4.1 x 5.1 x 4.9 cm in size. It extends posteriorly into the spinal canal to  cause severe central spinal stenosis. It also extends into the neural foramina, right greater than left causing significant narrowing. There are no other bone lesions. The bones are diffusely demineralized. IMPRESSION: 1. There is a destructive spinal mass centered on the T8 and T9 vertebrae, but also involving the lower aspect of the T7 vertebra and upper aspect of T10. The mass causes severe central stenosis and significant narrowing of the neural foramina. This mass may reflect a primary bone neoplasm such as a plasmacytoma. A metastatic lesion is possible although a primary neoplasm is not visualized. Followup thoracic spine MRI with and without contrast to assess the extent of cord compression is recommended. 2. No other bone lesions. 3. Small left and minimal right pleural effusions. There is associated dependent atelectasis most evident in the left lower lobe. 4. No acute abnormalities below the diaphragm. Electronically Signed   By: Lajean Manes M.D.   On: 04/02/2016 20:10     Assessment/Plan: 82 yo female with large mass replacing much of T8 and T9 with severe stenosis but no obvious neurologic compromise at present. Had long d/w family and went over images with her son. difficult to help the daughter understand the gravity of the situation I think.   First, the family needs to determine just how aggressive they want to be. I think any treatment will be palliative only. I do not think she is a surgical candidate. A simple laminectomy will not suffice with circumferential tumor , and she would tolerate a decompression and instrumented fusion (multilevel corpectomy, strut and screws for 6-8 levels?). Need biopsy , SPEP, UPEP etc to determine dx. Onc/ rad onc consults.  Let me know if I can help in any way. I also explained that she is originally (and still) a patient of Dr Lynann Bologna and they can seek his opinion also.   Ilija Maxim S 04/02/2016 10:37 PM

## 2016-04-02 NOTE — ED Notes (Signed)
IV attempted x's 1 without success. 

## 2016-04-02 NOTE — ED Notes (Signed)
Delay in lab draw MD examining pt

## 2016-04-02 NOTE — ED Notes (Signed)
Pt to xray at this time.

## 2016-04-02 NOTE — ED Notes (Signed)
Delay in lab draw pt using bedpan 

## 2016-04-02 NOTE — ED Notes (Signed)
Delay in lab draw pt family states she needs to use the bathroom.

## 2016-04-02 NOTE — ED Notes (Signed)
Pt to CT at this time.

## 2016-04-02 NOTE — ED Notes (Signed)
IV team at bedside 

## 2016-04-02 NOTE — ED Provider Notes (Signed)
[facility] DEPT Provider Note   CSN: [id number] Arrival date & time: 04/02/16  1350     History   Chief Complaint Chief Complaint  Patient presents with  . Back Pain    HPI TIARIA GARSKE is a 82 y.o. female with a past medical history significant for schizoaffective disorder, osteopenia, hypertension, hyperlipidemia, and prior squamous cell carcinoma who presents with family for worsening back pain, fatigue, left-sided chest pain, frequency, and coordination problem.  History provided is from the patient, the daughter, and son. Additional information was provided by the son through the letter seen below in clinical photos.   Patient presents for several complaints but the most concerning is her worsening back pain. They report that she previously was diagnosed with compression fractures that were concerning for malignancy and occult fracture in April. The patient reportedly did not have follow-up or further workup at that time. She says that over the last several days, the pain has worsened and it is severely limiting her mobility. She also reports more fatigue, pain from her neck to her low back, left-sided chest pain, and problems with coordination. She says that she has never had any tremor but feels her hands will not stop shaking.  She describes her chest pain as sharp and located in the lower left lateral chest. He says that it is not improved or worsened by exertion. It is nonpleuritic. She says that she has had some left upper quadrant abdominal pain that appears to be associated with her back pain. Says that sometimes there is swelling in her left upper quadrant abdomen.   She denies any fevers but does report some chills. She denies shortness of breath or cough. She denies current nausea, vomiting, constipation, or diarrhea. She does report frequency but denies dysuria, foul smell, or change in appearance of urine. She does not take any medicines.  Son provided additional  history and he is concerned for malignancy.    The history is provided by the patient and a relative (daughter and son). No language interpreter was used.  Illness  The current episode started 2 days ago. The problem occurs constantly. The problem has been gradually worsening. Associated symptoms include chest pain, abdominal pain and headaches. Pertinent negatives include no shortness of breath. The symptoms are aggravated by bending and twisting. Nothing relieves the symptoms. She has tried acetaminophen for the symptoms. The treatment provided no relief.  Back Pain   This is a new problem. The current episode started more than 1 week ago. The problem occurs constantly. The problem has been gradually worsening. The pain is associated with no known injury. The pain is present in the thoracic spine and lumbar spine. The quality of the pain is described as shooting and stabbing. Radiates to: down legs. The pain is at a severity of 10/10. The pain is severe. The symptoms are aggravated by bending, certain positions and twisting. The pain is the same all the time. Associated symptoms include chest pain, headaches and abdominal pain. Pertinent negatives include no fever, no numbness, no bowel incontinence, no perianal numbness, no bladder incontinence, no dysuria and no weakness. She has tried heat for the symptoms. The treatment provided no relief. Risk factors include a history of osteoporosis and poor posture.  Urinary Frequency  This is a new problem. The problem occurs constantly. Associated symptoms include chest pain, abdominal pain and headaches. Pertinent negatives include no shortness of breath. Nothing aggravates the symptoms. Nothing relieves the symptoms.  Neurologic Problem  This  is a new problem. The problem occurs constantly. Associated symptoms include chest pain, abdominal pain and headaches. Pertinent negatives include no shortness of breath. Associated symptoms comments: Shaking hands  bilaterally with movement. .                Past Medical History:  Diagnosis Date  . Hyperlipidemia   . HYPERTENSION   . Osteoporosis   . SCC (squamous cell carcinoma)    in situ R pretibial area  . Schizoaffective disorder [facility])     Patient Active Problem List   Diagnosis Date Noted  . Pathologic vertebral fracture 11/12/2015  . Dyspnea 11/11/2015  . Pleural effusion, bilateral 11/11/2015  . PCP NOTES >>>>>>>>>>>>>>>>>>>>>>>>>>>>>>>> 10/26/2015  . Hammer toe of left foot 01/30/2015  . Bilateral leg edema 09/10/2014  . Onychomycosis 05/19/2014  . Pain in lower limb 05/19/2014  . Fatigue 02/11/2014  . Medicare annual wellness visit, subsequent 09/02/2013  . Encounter for general adult medical examination without abnormal findings 09/02/2013  . Pain in joint, foot, left 07/10/2013  . Corn of toe 02/04/2013  . Varicose veins of lower extremities with inflammation 11/20/2012  . Varicose veins of lower extremities with other complications [id number]  . DEGENERATIVE JOINT DISEASE 01/12/2010  . Schizophrenia ([place]) 06/30/2009  . SKIN CANCER, LEG 05/29/2009  . Cancer of skin of leg 05/29/2009  . OSTEOPOROSIS 08/26/2008  . HYPERLIPIDEMIA 10/16/2006  . Essential hypertension 10/16/2006    Past Surgical History:  Procedure Laterality Date  . BREAST BIOPSY     benign tumor  . CATARACT EXTRACTION    . CHOLECYSTECTOMY    . LASER ABLATION Left    left LE   . LEG SURGERY Right 10/2012   Ruptured Veins  . PARTIAL HYSTERECTOMY     due to bleeding  . TONSILLECTOMY      OB History    No data available       Home Medications    Prior to Admission medications   Medication Sig Start Date End Date Taking? Authorizing Provider  acetaminophen-codeine (TYLENOL #3) 300-30 MG tablet Take 1 tablet by mouth every 8 (eight) hours as needed for severe pain. 03/23/16   Colon Branch, MD  augmented betamethasone dipropionate (DIPROLENE-AF) 0.05 % cream Apply topically 2 (two)  times daily. 12/08/15   Colon Branch, MD  Calcium Carbonate (CALTRATE 600 PO) Take by mouth. Reported on 12/08/2015    Historical Provider, MD  cycloSPORINE (RESTASIS) 0.05 % ophthalmic emulsion Place 1 drop into both eyes 2 (two) times daily.    Historical Provider, MD  ezetimibe (ZETIA) 10 MG tablet Take 1 tablet (10 mg total) by mouth at bedtime. Patient not taking: Reported on 12/08/2015 10/26/15   Colon Branch, MD  furosemide (LASIX) 20 MG tablet Take 1 tablet (20 mg total) by mouth daily. 12/08/15   Colon Branch, MD  losartan-hydrochlorothiazide (HYZAAR) 50-12.5 MG tablet Take 1 tablet by mouth daily. 09/03/15   Colon Branch, MD  Multiple Vitamins-Minerals (MULTIVITAMIN WITH MINERALS) tablet Take 1 tablet by mouth daily.    Historical Provider, MD  potassium chloride (K-DUR,KLOR-CON) 10 MEQ tablet Take 1 tablet (10 mEq total) by mouth daily. 12/08/15   Colon Branch, MD    Family History Family History  Problem Relation Age of Onset  . Colon cancer Neg Hx   . Breast cancer Neg Hx   . CAD Neg Hx     Social History Social History  Substance Use Topics  . Smoking status: Never  Smoker  . Smokeless tobacco: Never Used  . Alcohol use No     Allergies   Bacitracin-polymyxin b and Penicillins   Review of Systems Review of Systems  Constitutional: Positive for chills and fatigue. Negative for diaphoresis and fever.  HENT: Negative for congestion and rhinorrhea.   Eyes: Negative for visual disturbance.  Respiratory: Negative for cough, chest tightness, shortness of breath, wheezing and stridor.   Cardiovascular: Positive for chest pain. Negative for palpitations and leg swelling.  Gastrointestinal: Positive for abdominal pain. Negative for bowel incontinence, diarrhea, nausea and vomiting.  Genitourinary: Positive for flank pain and frequency. Negative for bladder incontinence and dysuria.  Musculoskeletal: Positive for back pain and neck pain. Negative for neck stiffness.  Skin: Negative for  wound.  Neurological: Positive for tremors and headaches. Negative for dizziness, weakness, light-headedness and numbness.  Psychiatric/Behavioral: Negative for agitation.  All other systems reviewed and are negative.    Physical Exam Updated Vital Signs BP 165/80 (BP Location: Right Arm)   Pulse 102   Temp 98.3 F (36.8 C) (Oral)   Resp 16   Ht 5\' 4"  (1.626 m)   Wt 161 lb (73 kg)   SpO2 97%   BMI 27.64 kg/m   Physical Exam  Constitutional: She is oriented to person, place, and time. She appears well-developed and well-nourished. No distress.  HENT:  Head: Normocephalic and atraumatic.  Mouth/Throat: Oropharynx is clear and moist. No oropharyngeal exudate.  Eyes: Conjunctivae and EOM are normal.  Cardiovascular: Regular rhythm, normal heart sounds and intact distal pulses.  Tachycardia present.   No murmur heard. Pulmonary/Chest: Effort normal and breath sounds normal. No stridor. No respiratory distress. She has no wheezes. She exhibits no tenderness.  Abdominal: Soft. Normal appearance and bowel sounds are normal. She exhibits no distension. There is no tenderness. There is CVA tenderness. There is no rigidity.  Musculoskeletal: She exhibits tenderness.       Lumbar back: She exhibits tenderness and pain.       Back:  Neurological: She is alert and oriented to person, place, and time. She has normal reflexes. She is not disoriented. She displays tremor. She displays normal reflexes. No cranial nerve deficit or sensory deficit. She exhibits normal muscle tone. Coordination abnormal. GCS eye subscore is 4. GCS verbal subscore is 5. GCS motor subscore is 6.  Patient has bilateral ataxia with finger-nose-finger.   Skin: Skin is warm. Capillary refill takes less than 2 seconds. She is not diaphoretic. No erythema.  Psychiatric: She has a normal mood and affect.  Nursing note and vitals reviewed.    ED Treatments / Results  Labs (all labs ordered are listed, but only abnormal  results are displayed) Labs Reviewed  COMPREHENSIVE METABOLIC PANEL - Abnormal; Notable for the following:       Result Value   Glucose, Bld 109 (*)    GFR calc non Af Amer 59 (*)    All other components within normal limits  URINALYSIS, ROUTINE W REFLEX MICROSCOPIC (NOT AT [facility]) - Abnormal; Notable for the following:    Hgb urine dipstick TRACE (*)    Ketones, ur 15 (*)    All other components within normal limits  URINE MICROSCOPIC-ADD ON - Abnormal; Notable for the following:    Squamous Epithelial / LPF 0-5 (*)    Bacteria, UA RARE (*)    All other components within normal limits  URINE CULTURE  CBC WITH DIFFERENTIAL/PLATELET  LIPASE, BLOOD  BRAIN NATRIURETIC PEPTIDE  PROTIME-INR  I-STAT  CG4 LACTIC ACID, ED  Randolm Idol, ED  I-STAT CG4 LACTIC ACID, ED  Randolm Idol, ED    EKG  EKG Interpretation  Date/Time:  Saturday April 02 2016 15:06:24 EDT Ventricular Rate:  97 PR Interval:    QRS Duration: 86 QT Interval:  427 QTC Calculation: 543 R Axis:   -38 Text Interpretation:  Sinus rhythm Left axis deviation Low voltage, precordial leads Consider anterior infarct Prolonged QT interval NO Stemi Confirmed by Sherry Ruffing MD, [place] [phone]) on 04/02/2016 8:08:29 PM       Radiology Dg Chest 2 View  Result Date: 04/02/2016 CLINICAL DATA:  Severe back pain.  Left-sided chest pain today. EXAM: CHEST  2 VIEW COMPARISON:  Chest radiograph 11/11/2015, spine MRI 10/15/2015 FINDINGS: The cardiac silhouette is enlarged. Mediastinal contours appear intact. There is no evidence of pneumothorax. There has been interval development of moderate in size left pleural effusion. Smaller right subpulmonic effusion is also possible. There is mild interstitial prominence bilaterally. If there is a significantly exaggerated thoracic kyphosis centered in the second third of the thoracic spine. The thoracic vertebrae are not well evaluated due to osteopenia and positioning, but new or  advanced compression fracture is suspected based on the exaggerated kyphotic deformity. IMPRESSION: Moderate in size left pleural effusion. Possible small right subpulmonic pleural effusion. Mild pulmonary vascular congestion. Significantly exaggerated thoracic kyphosis when compared to patient's prior chest radiograph and MRI,which is suggestive of new or worsening compression fracture of one or more thoracic vertebral bodies.The thoracic vertebrae are not well evaluated due to osteopenia and positioning. Patient has a known T8, T9, and T11 expansile lesions with associated pathologic fractures of T8 and T9 demonstrated by MRI in April 2017. Electronically Signed   By: Fidela Salisbury M.D.   On: 04/02/2016 17:42   Ct Head Wo Contrast  Result Date: 04/02/2016 CLINICAL DATA:  Severe back pain. Compression fracture diagnosed in April. Pain is worsening today. EXAM: CT HEAD WITHOUT CONTRAST CT CERVICAL SPINE WITHOUT CONTRAST TECHNIQUE: Multidetector CT imaging of the head and cervical spine was performed following the standard protocol without intravenous contrast. Multiplanar CT image reconstructions of the cervical spine were also generated. COMPARISON:  CT head 11/30/2006 FINDINGS: CT HEAD FINDINGS Brain: Mild diffuse cerebral atrophy. Ventricular dilatation consistent with central atrophy. Low-attenuation changes in the deep white matter consistent with small vessel ischemia. No evidence of acute infarction, hemorrhage, hydrocephalus, extra-axial collection or mass lesion/mass effect. Vascular: No hyperdense vessel or unexpected calcification. Skull: Normal. Negative for fracture or focal lesion. Sinuses/Orbits: No acute finding. Degenerative changes in the temporomandibular joints. Other: No significant changes since previous study. CT CERVICAL SPINE FINDINGS Alignment: Slight anterior subluxation of C7 on T1 is likely degenerative. Otherwise normal alignment of the cervical spine. Skull base and  vertebrae: No acute fracture. No primary bone lesion or focal pathologic process. Soft tissues and spinal canal: No prevertebral fluid or swelling. No visible canal hematoma. Disc levels: Degenerative changes throughout the cervical spine with narrowed interspaces and endplate hypertrophic changes. Degenerative disc disease at C5-6 and C6-7 levels. Prominent posterior osteophytes at C5-6 and C6-7 cause some impression upon the central canal. C1-2 articulation appears intact. Congenital nonunion of the posterior arch of C1. Degenerative changes throughout the cervical facet joints with posterior coalition at C4-5. Upper chest: There is a fracture of the posterior right first rib but associated with a mildly expansile lucent lesions suggesting a pathologic fracture. Appearance is suspicious for metastasis. Other: None. IMPRESSION: Diffuse degenerative changes throughout the cervical spine.  No acute displaced cervical spine fractures identified. There is a pathologic fracture of the right posterior first rib. Electronically Signed   By: Lucienne Capers M.D.   On: 04/02/2016 20:01   Ct Cervical Spine Wo Contrast  Result Date: 04/02/2016 CLINICAL DATA:  Severe back pain. Compression fracture diagnosed in April. Pain is worsening today. EXAM: CT HEAD WITHOUT CONTRAST CT CERVICAL SPINE WITHOUT CONTRAST TECHNIQUE: Multidetector CT imaging of the head and cervical spine was performed following the standard protocol without intravenous contrast. Multiplanar CT image reconstructions of the cervical spine were also generated. COMPARISON:  CT head 11/30/2006 FINDINGS: CT HEAD FINDINGS Brain: Mild diffuse cerebral atrophy. Ventricular dilatation consistent with central atrophy. Low-attenuation changes in the deep white matter consistent with small vessel ischemia. No evidence of acute infarction, hemorrhage, hydrocephalus, extra-axial collection or mass lesion/mass effect. Vascular: No hyperdense vessel or unexpected  calcification. Skull: Normal. Negative for fracture or focal lesion. Sinuses/Orbits: No acute finding. Degenerative changes in the temporomandibular joints. Other: No significant changes since previous study. CT CERVICAL SPINE FINDINGS Alignment: Slight anterior subluxation of C7 on T1 is likely degenerative. Otherwise normal alignment of the cervical spine. Skull base and vertebrae: No acute fracture. No primary bone lesion or focal pathologic process. Soft tissues and spinal canal: No prevertebral fluid or swelling. No visible canal hematoma. Disc levels: Degenerative changes throughout the cervical spine with narrowed interspaces and endplate hypertrophic changes. Degenerative disc disease at C5-6 and C6-7 levels. Prominent posterior osteophytes at C5-6 and C6-7 cause some impression upon the central canal. C1-2 articulation appears intact. Congenital nonunion of the posterior arch of C1. Degenerative changes throughout the cervical facet joints with posterior coalition at C4-5. Upper chest: There is a fracture of the posterior right first rib but associated with a mildly expansile lucent lesions suggesting a pathologic fracture. Appearance is suspicious for metastasis. Other: None. IMPRESSION: Diffuse degenerative changes throughout the cervical spine. No acute displaced cervical spine fractures identified. There is a pathologic fracture of the right posterior first rib. Electronically Signed   By: Lucienne Capers M.D.   On: 04/02/2016 20:01    Procedures Procedures (including critical care time)  Angiocath insertion Performed by: Gwenyth Allegra Kenasia Scheller  Consent: Verbal consent obtained. Risks and benefits: risks, benefits and alternatives were discussed Time out: Immediately prior to procedure a "time out" was called to verify the correct patient, procedure, equipment, support staff and site/side marked as required.  Preparation: Patient was prepped and draped in the usual sterile fashion.  Vein  Location: Right arm Ultrasound Guided  Gauge: 18 gauge  Normal blood return and flush without difficulty Patient tolerance: Patient tolerated the procedure well with no immediate complications.     Medications Ordered in ED Medications  morphine 4 MG/ML injection 4 mg (not administered)  iopamidol (ISOVUE-300) 61 % injection (not administered)     Initial Impression / Assessment and Plan / ED Course  I have reviewed the triage vital signs and the nursing notes.  Pertinent labs & imaging results that were available during my care of the patient were reviewed by me and considered in my medical decision making (see chart for details).  Clinical Course    MONSERRAT PAVEK is a 82 y.o. female with a past medical history significant for schizoaffective disorder, osteopenia, hypertension, hyperlipidemia, and prior squamous cell carcinoma who presents with family for worsening back pain, fatigue, left-sided chest pain, frequency, and coordination problem.  History and exam are seen above.  Based on patient's complaint of back  pain and possible history of pathologic fracture, clinical concern for occult fractures. Patient tender all over her mid and low back. No neck tenderness however, patient does report recent neck pain. Patient will have imaging of full spine. For patient's problem of ataxia and bilateral upper extremities, patient will have a head CT. Patient will also have workup started to look for electrolyte abnormality, dehydration, and infection as source of abdominal pain, frequency, and fatigue.  Ultrasound IV placed by me.  For left-sided chest pain, patient will have EKG, chest x-ray, and troponin.  Anticipate following up on diagnostic workup to determine disposition.  Patient care transferred to Dr. Johnney Killian while awaiting diagnostic workup results. Patient's care transferred in stable condition.   Final Clinical Impressions(s) / ED Diagnoses   Final diagnoses:    Acute thoracic back pain, unspecified back pain laterality      Courtney Paris, MD 04/02/16 2008

## 2016-04-02 NOTE — ED Notes (Signed)
Neurosurgeon in to assess pt at this time 

## 2016-04-02 NOTE — ED Triage Notes (Signed)
Pt reports to the ED for eval of back pain. Pt had some compression fx diagnosed in April. The pain got worse last night and into today. Pt able to move all extremities. Pt denies any numbness, tingling, paralysis, or bowel or bladder incontinence. Pt A&Ox4, resp e/u, and skin warm and dry.

## 2016-04-02 NOTE — H&P (Signed)
History and Physical  Patient Name: Mikayla Marks     [id number]    DOB: 06/27/1928    DOA: 04/02/2016 PCP: Kathlene November, MD   Patient coming from: Home  Chief Complaint: Back pain  HPI: Mikayla Marks is a 82 y.o. female with a past medical history significant for paranoid schizophrenia and HTN who presents with back pain for 4 months.  The patient was in her usual state of health until around mid-February when she went on a long car ride with her daughter and developed some back pain.  By April, this back pain had gotten much worse and she had a new severe kyphosis:  Mid April 2017: Seen at [facility], Dr. Mina Marble PM&R, MRI spine ordered which showed "lesions within the T8 and T9 vertebral bodies with associated pathologic fractures and osseous retropulsion...worrisome for metastatic disease, myeloma, or lymphoma".  Was referred to Oncology, did not call back. 10/26/15: Saw PCP in follow up, was recommended to see Oncology, refused. 11/11/15: Saw Pulmonology as self-referral for pleural effusions (noted incidentally on MRI of spine that showed metastasis) was recommended to see Oncology, have PET and biopsy via IR, refused 12/08/15: Saw PCP again, SPEP ordered and no M-spike (only "band of restricted mobility" in beta globulins), again referral to Hematology-Oncology and also IR that week June 2017: Saw Orthopedic surgery who recommended biopsy, family and patient declined again  Since then, the patient has had severe back pain, treated 4-6 times per day with Tylenol 3 prescribed by Dr. Larose Kells, PCP.  Daughter believes the "swelling" in her back improved, and her severe kyphosis stabilized, but son describes to me that she has had intense pain with any movement, and transfers into and out of a car have been nearly impossible because of pain for >2 months, and that this contributes to her having missed physician appointments, although paranoid schizophrenia contributes.  Now in the last few  days, she has had pain unrelieved with Tylenol 3.  This is associated with some "lack of coordination" and pain with any movement, and the pain extends from her neck to her lower back, and is worse with any movement and severe in intensity, such that she cries out in pain with any movement.  There has been no leg weakness or incontinence.  She has pain radiating into the chest.    ED course: -Afebrile, heart rate 100s, respirations and pulse oximetry normal, blood pressure 165/80 -Na 138, K 3.8, Cr 0.87, WBC 7 K, Hgb 13.9, urinalysis showed ketones only, lipase normal, BMP normal, INR normal, lactate normal, troponin negative -Chest x-ray showed marked kyphosis and known pathologic fractures at T8 and 9 -CT of the head was unremarkable, showed pathologic lesion in the first rib -CT of the chest abdomen and pelvis with contrast showed no primary cancer or breast mass, but did show the known 4.5 x 5 cm spinal mass extending from T8 and 9 with associated pathologic fracture -The case was discussed with Dr. Ronnald Ramp from neurosurgery, who recommended either strict palliative care or biopsy and consultation with oncology to direct such palliative measures as radiation or chemo -TRH were asked to evaluate for admission      ROS: Review of Systems  Constitutional: Negative for chills and fever.  Musculoskeletal: Positive for back pain and neck pain.  Neurological: Negative for sensory change and focal weakness.  Psychiatric/Behavioral: Positive for hallucinations (chronic schizophrenia). Negative for memory loss.  All other systems reviewed and are negative.  Past Medical History:  Diagnosis Date  . Hyperlipidemia   . HYPERTENSION   . Osteoporosis   . SCC (squamous cell carcinoma)    in situ R pretibial area  . Schizoaffective disorder [facility])     Past Surgical History:  Procedure Laterality Date  . BREAST BIOPSY     benign tumor  . CATARACT EXTRACTION    . CHOLECYSTECTOMY    .  LASER ABLATION Left    left LE   . LEG SURGERY Right 10/2012   Ruptured Veins  . PARTIAL HYSTERECTOMY     due to bleeding  . TONSILLECTOMY      Social History: Patient lives with her daughter.  The patient walks unassisted at baseline.  From [place] originally.  Lived in [place] most of her life, was married to Haematologist at [place].  Daughter [name] is a primary caregiver.  Son Percell Miller is an [place] in [place].  Patient is a non-smoker.    Allergies  Allergen Reactions  . Other Other (See Comments)    5FU cream - stroke like symptoms  . Bacitracin-Polymyxin B Other (See Comments)    Reaction unknown.   Marland Kitchen Penicillins Rash    Family history: family history includes Heart Problems in her mother.  Prior to Admission medications   Medication Sig Start Date End Date Taking? Authorizing Provider  acetaminophen-codeine (TYLENOL #3) 300-30 MG tablet Take 1 tablet by mouth every 8 (eight) hours as needed for severe pain. 03/23/16  Yes Colon Branch, MD  Calcium Carbonate (CALTRATE 600 PO) Take 1 tablet by mouth daily.    Yes Historical Provider, MD  cycloSPORINE (RESTASIS) 0.05 % ophthalmic emulsion Place 1 drop into both eyes 2 (two) times daily.   Yes Historical Provider, MD  Liniments (SALONPAS EX) Apply 1 patch topically as needed (for pain).   Yes Historical Provider, MD  Multiple Vitamins-Minerals (MULTIVITAMIN WITH MINERALS) tablet Take 1 tablet by mouth 4 (four) times a week.    Yes Historical Provider, MD  Probiotic Product (PROBIOTIC PO) Take 2 capsules by mouth daily.   Yes Historical Provider, MD  tiZANidine (ZANAFLEX) 4 MG tablet Take 4-8 mg by mouth daily as needed for muscle spasms.   Yes Historical Provider, MD  augmented betamethasone dipropionate (DIPROLENE-AF) 0.05 % cream Apply topically 2 (two) times daily. Patient not taking: Reported on 04/02/2016 12/08/15   Colon Branch, MD  ezetimibe (ZETIA) 10 MG tablet Take 1 tablet (10 mg total) by mouth at bedtime. Patient not taking:  Reported on 04/02/2016 10/26/15   Colon Branch, MD  furosemide (LASIX) 20 MG tablet Take 1 tablet (20 mg total) by mouth daily. Patient not taking: Reported on 04/02/2016 12/08/15   Colon Branch, MD  losartan-hydrochlorothiazide [facility]) 50-12.5 MG tablet Take 1 tablet by mouth daily. Patient not taking: Reported on 04/02/2016 09/03/15   Colon Branch, MD  potassium chloride (K-DUR,KLOR-CON) 10 MEQ tablet Take 1 tablet (10 mEq total) by mouth daily. Patient not taking: Reported on 04/02/2016 12/08/15   Colon Branch, MD       Physical Exam: BP 154/68   Pulse 93   Temp 98.3 F (36.8 C) (Oral)   Resp 20   Ht 5\' 4"  (1.626 m)   Wt 73 kg (161 lb)   SpO2 95%   BMI 27.64 kg/m  General appearance: Frail elderly female, alert and in no acute distress, but uncomfortable.   Eyes: Anicteric, conjunctiva pink, lids and lashes normal. PERRL.    ENT:  No nasal deformity, discharge, epistaxis.  Hearing normal. OP tacky dry without lesions.   Neck: No neck masses.  Trachea midline.  No thyromegaly/tenderness. Lymph: No cervical or supraclavicular lymphadenopathy. Skin: Warm and dry.  No jaundice.  No suspicious rashes or lesions.  Some discoloration of the shins bilaterally, suspect chronic venous stasis change. Cardiac: RRR, nl S1-S2, no murmurs appreciated.  Capillary refill is brisk.  JVP normal.  No LE edema.  Radial and DP pulses 2+ and symmetric. Respiratory: Normal respiratory rate and rhythm.  CTAB without rales or wheezes. Abdomen: Abdomen soft.  No TTP. No ascites, distension, hepatosplenomegaly.   MSK: Laying in bed.  MSK exam limited by pain with any movement.  Kyphosis is noted.  No extremity deformities or joint effusions.  No clubbing or cyanosis. Neuro: Cranial nerves normal.  Sensation intact to light touch. Speech is fluent.  Psych: Sensorium intact and responding to questions, attention normal.  Behavior appropriate.  Affect normal.  Judgment and insight appear normal without evidence of paranoia or  hallucinations at this time.     Labs on Admission:  I have personally reviewed following labs and imaging studies: CBC:  Recent Labs Lab 04/02/16 1723  WBC 7.0  NEUTROABS 5.5  HGB 13.9  HCT 42.1  MCV 93.1  PLT [id number]   Basic Metabolic Panel:  Recent Labs Lab 04/02/16 1723  NA 138  K 3.8  CL 103  CO2 24  GLUCOSE 109*  BUN 12  CREATININE 0.87  CALCIUM 9.9   GFR: Estimated Creatinine Clearance: 45.4 mL/min (by C-G formula based on SCr of 0.87 mg/dL).  Liver Function Tests:  Recent Labs Lab 04/02/16 1723  AST 22  ALT 19  ALKPHOS 55  BILITOT 0.7  PROT 7.0  ALBUMIN 4.0    Recent Labs Lab 04/02/16 1723  LIPASE 31   No results for input(s): AMMONIA in the last 168 hours. Coagulation Profile:  Recent Labs Lab 04/02/16 1723  INR 1.03   Cardiac Enzymes: No results for input(s): CKTOTAL, CKMB, CKMBINDEX, TROPONINI in the last 168 hours. BNP (last 3 results)  Recent Labs  11/11/15 1623  PROBNP 68.0   HbA1C: No results for input(s): HGBA1C in the last 72 hours. CBG: No results for input(s): GLUCAP in the last 168 hours. Lipid Profile: No results for input(s): CHOL, HDL, LDLCALC, TRIG, CHOLHDL, LDLDIRECT in the last 72 hours. Thyroid Function Tests: No results for input(s): TSH, T4TOTAL, FREET4, T3FREE, THYROIDAB in the last 72 hours. Anemia Panel: No results for input(s): VITAMINB12, FOLATE, FERRITIN, TIBC, IRON, RETICCTPCT in the last 72 hours. Sepsis Labs: Invalid input(s): PROCALCITONIN, LACTICIDVEN No results found for this or any previous visit (from the past 240 hour(s)).       Radiological Exams on Admission: Personally reviewed: Dg Chest 2 View  Result Date: 04/02/2016 CLINICAL DATA:  Severe back pain.  Left-sided chest pain today. EXAM: CHEST  2 VIEW COMPARISON:  Chest radiograph 11/11/2015, spine MRI 10/15/2015 FINDINGS: The cardiac silhouette is enlarged. Mediastinal contours appear intact. There is no evidence of pneumothorax. There  has been interval development of moderate in size left pleural effusion. Smaller right subpulmonic effusion is also possible. There is mild interstitial prominence bilaterally. If there is a significantly exaggerated thoracic kyphosis centered in the second third of the thoracic spine. The thoracic vertebrae are not well evaluated due to osteopenia and positioning, but new or advanced compression fracture is suspected based on the exaggerated kyphotic deformity. IMPRESSION: Moderate in size left pleural effusion. Possible small  right subpulmonic pleural effusion. Mild pulmonary vascular congestion. Significantly exaggerated thoracic kyphosis when compared to patient's prior chest radiograph and MRI,which is suggestive of new or worsening compression fracture of one or more thoracic vertebral bodies.The thoracic vertebrae are not well evaluated due to osteopenia and positioning. Patient has a known T8, T9, and T11 expansile lesions with associated pathologic fractures of T8 and T9 demonstrated by MRI in April 2017. Electronically Signed   By: Fidela Salisbury M.D.   On: 04/02/2016 17:42   Ct Head Wo Contrast  Result Date: 04/02/2016 CLINICAL DATA:  Severe back pain. Compression fracture diagnosed in April. Pain is worsening today. EXAM: CT HEAD WITHOUT CONTRAST CT CERVICAL SPINE WITHOUT CONTRAST TECHNIQUE: Multidetector CT imaging of the head and cervical spine was performed following the standard protocol without intravenous contrast. Multiplanar CT image reconstructions of the cervical spine were also generated. COMPARISON:  CT head 11/30/2006 FINDINGS: CT HEAD FINDINGS Brain: Mild diffuse cerebral atrophy. Ventricular dilatation consistent with central atrophy. Low-attenuation changes in the deep white matter consistent with small vessel ischemia. No evidence of acute infarction, hemorrhage, hydrocephalus, extra-axial collection or mass lesion/mass effect. Vascular: No hyperdense vessel or unexpected  calcification. Skull: Normal. Negative for fracture or focal lesion. Sinuses/Orbits: No acute finding. Degenerative changes in the temporomandibular joints. Other: No significant changes since previous study. CT CERVICAL SPINE FINDINGS Alignment: Slight anterior subluxation of C7 on T1 is likely degenerative. Otherwise normal alignment of the cervical spine. Skull base and vertebrae: No acute fracture. No primary bone lesion or focal pathologic process. Soft tissues and spinal canal: No prevertebral fluid or swelling. No visible canal hematoma. Disc levels: Degenerative changes throughout the cervical spine with narrowed interspaces and endplate hypertrophic changes. Degenerative disc disease at C5-6 and C6-7 levels. Prominent posterior osteophytes at C5-6 and C6-7 cause some impression upon the central canal. C1-2 articulation appears intact. Congenital nonunion of the posterior arch of C1. Degenerative changes throughout the cervical facet joints with posterior coalition at C4-5. Upper chest: There is a fracture of the posterior right first rib but associated with a mildly expansile lucent lesions suggesting a pathologic fracture. Appearance is suspicious for metastasis. Other: None. IMPRESSION: Diffuse degenerative changes throughout the cervical spine. No acute displaced cervical spine fractures identified. There is a pathologic fracture of the right posterior first rib. Electronically Signed   By: Lucienne Capers M.D.   On: 04/02/2016 20:01   Ct Chest W Contrast  Result Date: 04/02/2016 CLINICAL DATA:  Pt reports to the ED for eval of back pain. Pt had some compression fx diagnosed in April. The pain got worse last night and into today. Pt able to move all extremities. Pt denies any numbness, tingling, paralysis, or bowel or bladder incontinence EXAM: CT CHEST, ABDOMEN, AND PELVIS WITH CONTRAST TECHNIQUE: Multidetector CT imaging of the chest, abdomen and pelvis was performed following the standard  protocol during bolus administration of intravenous contrast. CONTRAST:  100 mL of Isovue-300 intravenous contrast COMPARISON:  Chest CT, 08/22/2014 FINDINGS: CT CHEST FINDINGS Cardiovascular: Heart is normal in size. There are mild coronary artery calcifications. Great vessels normal in caliber. Mild atherosclerotic plaque is noted along the aortic arch and descending thoracic aorta. No significant stenosis. Mediastinum/Nodes: 14 mm nodule lies in the posterior left thyroid lobe, stable. No mediastinal or hilar masses or enlarged lymph nodes. Lungs/Pleura: Small left a minimal right pleural effusions. There is dependent opacity at the lung bases, most notably in the left lower lobe. This is likely atelectasis. Pneumonia is possible.  There is no evidence of pulmonary edema. No lung mass or suspicious nodule. No pneumothorax. CT ABDOMEN PELVIS FINDINGS Hepatobiliary: Liver is unremarkable. There is intra and extra hepatic bile duct dilation. Common bile duct measures 12 mm in greatest dimension. No convincing duct stone. Gallbladder surgically absent. Pancreas: No pancreatic mass or inflammation. Mild dilation of the pancreatic duct. Spleen: Normal in size without focal abnormality. Adrenals/Urinary Tract: Mild bilateral renal cortical thinning. No renal masses or stones. No hydronephrosis. Ureters normal in course and in caliber. Bladder is unremarkable. Stomach/Bowel: Stomach and small bowel are unremarkable. Mild increased stool in the colon. No colonic wall thickening or inflammatory change. Appendix not visualized. No evidence of appendicitis. Vascular/Lymphatic: Mild atherosclerotic calcifications noted along a normal caliber abdominal aorta. No enlarged lymph nodes. Reproductive: Status post hysterectomy. No adnexal masses. Other: No abdominal wall hernia or abnormality. No abdominopelvic ascites. MUSCULOSKELETAL FINDINGS There is a destructive mass arising from the T8 and T9 vertebrae extending to involve the  lower aspect of the T7 vertebra and the upper endplate of QA348G as well. Mass measures 4.1 x 5.1 x 4.9 cm in size. It extends posteriorly into the spinal canal to cause severe central spinal stenosis. It also extends into the neural foramina, right greater than left causing significant narrowing. There are no other bone lesions. The bones are diffusely demineralized. IMPRESSION: 1. There is a destructive spinal mass centered on the T8 and T9 vertebrae, but also involving the lower aspect of the T7 vertebra and upper aspect of T10. The mass causes severe central stenosis and significant narrowing of the neural foramina. This mass may reflect a primary bone neoplasm such as a plasmacytoma. A metastatic lesion is possible although a primary neoplasm is not visualized. Followup thoracic spine MRI with and without contrast to assess the extent of cord compression is recommended. 2. No other bone lesions. 3. Small left and minimal right pleural effusions. There is associated dependent atelectasis most evident in the left lower lobe. 4. No acute abnormalities below the diaphragm. Electronically Signed   By: Lajean Manes M.D.   On: 04/02/2016 20:10   Ct Cervical Spine Wo Contrast  Result Date: 04/02/2016 CLINICAL DATA:  Severe back pain. Compression fracture diagnosed in April. Pain is worsening today. EXAM: CT HEAD WITHOUT CONTRAST CT CERVICAL SPINE WITHOUT CONTRAST TECHNIQUE: Multidetector CT imaging of the head and cervical spine was performed following the standard protocol without intravenous contrast. Multiplanar CT image reconstructions of the cervical spine were also generated. COMPARISON:  CT head 11/30/2006 FINDINGS: CT HEAD FINDINGS Brain: Mild diffuse cerebral atrophy. Ventricular dilatation consistent with central atrophy. Low-attenuation changes in the deep white matter consistent with small vessel ischemia. No evidence of acute infarction, hemorrhage, hydrocephalus, extra-axial collection or mass  lesion/mass effect. Vascular: No hyperdense vessel or unexpected calcification. Skull: Normal. Negative for fracture or focal lesion. Sinuses/Orbits: No acute finding. Degenerative changes in the temporomandibular joints. Other: No significant changes since previous study. CT CERVICAL SPINE FINDINGS Alignment: Slight anterior subluxation of C7 on T1 is likely degenerative. Otherwise normal alignment of the cervical spine. Skull base and vertebrae: No acute fracture. No primary bone lesion or focal pathologic process. Soft tissues and spinal canal: No prevertebral fluid or swelling. No visible canal hematoma. Disc levels: Degenerative changes throughout the cervical spine with narrowed interspaces and endplate hypertrophic changes. Degenerative disc disease at C5-6 and C6-7 levels. Prominent posterior osteophytes at C5-6 and C6-7 cause some impression upon the central canal. C1-2 articulation appears intact. Congenital nonunion of  the posterior arch of C1. Degenerative changes throughout the cervical facet joints with posterior coalition at C4-5. Upper chest: There is a fracture of the posterior right first rib but associated with a mildly expansile lucent lesions suggesting a pathologic fracture. Appearance is suspicious for metastasis. Other: None. IMPRESSION: Diffuse degenerative changes throughout the cervical spine. No acute displaced cervical spine fractures identified. There is a pathologic fracture of the right posterior first rib. Electronically Signed   By: Lucienne Capers M.D.   On: 04/02/2016 20:01   Ct Abdomen Pelvis W Contrast  Result Date: 04/02/2016 CLINICAL DATA:  Pt reports to the ED for eval of back pain. Pt had some compression fx diagnosed in April. The pain got worse last night and into today. Pt able to move all extremities. Pt denies any numbness, tingling, paralysis, or bowel or bladder incontinence EXAM: CT CHEST, ABDOMEN, AND PELVIS WITH CONTRAST TECHNIQUE: Multidetector CT imaging of  the chest, abdomen and pelvis was performed following the standard protocol during bolus administration of intravenous contrast. CONTRAST:  100 mL of Isovue-300 intravenous contrast COMPARISON:  Chest CT, 08/22/2014 FINDINGS: CT CHEST FINDINGS Cardiovascular: Heart is normal in size. There are mild coronary artery calcifications. Great vessels normal in caliber. Mild atherosclerotic plaque is noted along the aortic arch and descending thoracic aorta. No significant stenosis. Mediastinum/Nodes: 14 mm nodule lies in the posterior left thyroid lobe, stable. No mediastinal or hilar masses or enlarged lymph nodes. Lungs/Pleura: Small left a minimal right pleural effusions. There is dependent opacity at the lung bases, most notably in the left lower lobe. This is likely atelectasis. Pneumonia is possible. There is no evidence of pulmonary edema. No lung mass or suspicious nodule. No pneumothorax. CT ABDOMEN PELVIS FINDINGS Hepatobiliary: Liver is unremarkable. There is intra and extra hepatic bile duct dilation. Common bile duct measures 12 mm in greatest dimension. No convincing duct stone. Gallbladder surgically absent. Pancreas: No pancreatic mass or inflammation. Mild dilation of the pancreatic duct. Spleen: Normal in size without focal abnormality. Adrenals/Urinary Tract: Mild bilateral renal cortical thinning. No renal masses or stones. No hydronephrosis. Ureters normal in course and in caliber. Bladder is unremarkable. Stomach/Bowel: Stomach and small bowel are unremarkable. Mild increased stool in the colon. No colonic wall thickening or inflammatory change. Appendix not visualized. No evidence of appendicitis. Vascular/Lymphatic: Mild atherosclerotic calcifications noted along a normal caliber abdominal aorta. No enlarged lymph nodes. Reproductive: Status post hysterectomy. No adnexal masses. Other: No abdominal wall hernia or abnormality. No abdominopelvic ascites. MUSCULOSKELETAL FINDINGS There is a destructive  mass arising from the T8 and T9 vertebrae extending to involve the lower aspect of the T7 vertebra and the upper endplate of QA348G as well. Mass measures 4.1 x 5.1 x 4.9 cm in size. It extends posteriorly into the spinal canal to cause severe central spinal stenosis. It also extends into the neural foramina, right greater than left causing significant narrowing. There are no other bone lesions. The bones are diffusely demineralized. IMPRESSION: 1. There is a destructive spinal mass centered on the T8 and T9 vertebrae, but also involving the lower aspect of the T7 vertebra and upper aspect of T10. The mass causes severe central stenosis and significant narrowing of the neural foramina. This mass may reflect a primary bone neoplasm such as a plasmacytoma. A metastatic lesion is possible although a primary neoplasm is not visualized. Followup thoracic spine MRI with and without contrast to assess the extent of cord compression is recommended. 2. No other bone lesions. 3. Small  left and minimal right pleural effusions. There is associated dependent atelectasis most evident in the left lower lobe. 4. No acute abnormalities below the diaphragm. Electronically Signed   By: Lajean Manes M.D.   On: 04/02/2016 20:10    EKG: Independently reviewed. Rate 97, QTc 543.  Sinus rhythm.    Assessment/Plan  1. 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  She is currently not an operative candidate.  She received good pain relief with morphine 4 mg IV in the ER.   -Continue morphine 4 mg IV q3hrs -Transition tomorrow to oxycodone 5 mg PO q4hrs -Start docusate-senna BID -MiraLAX PRN -Consult to palliative care  Family will discuss with patient tonight whether she still desires biopsy, consultation with Oncology and  directed palliative cares versus simply palliative care  -Defer serum/urine IFE and IR consultation until family have had a chance to discuss      2. HTN:  Hypertensive at admission, suspect chronic plus pain related.  Patient no longer takes Hyzaar. -Can restart Hyzaar if needed  3. Schizophrenia:  Not in active treatment.  4. Prolonged QTc: -Avoid QT prolonging agents     DVT prophylaxis: SCDs  Code Status: FULL  Family Communication: Daughter [name] and son Percell Miller (MD, OB-Gyn in [place]).  Son Percell Miller unclear about formal POA, seemed to indicate it was he, no document available.  CODE STATUS initially entered in error as DNR, specifically confirmed with patient as "would want everything done except intubation" and would want CPR done.  I have placed an order for Limited resuscitation, but after reflection I am not clear that this is medically appropriate or that high quality CPR is feasible with her spine in the condition it is in. Disposition Plan: Anticipate overnight pain control and further discussion with family about whether they prefer a minimally invasive biopsy or pure palliation. Consults called: Neurosurgery, Dr. Ronnald Ramp Admission status: OBS, med surg At the point of initial evaluation, it is my clinical opinion that admission for OBSERVATION is reasonable and necessary because the patient's presenting complaints in the context of their chronic conditions represent sufficient risk of deterioration or significant morbidity to constitute reasonable grounds for close observation in the hospital setting, but that the patient may be medically stable for discharge from the hospital within 24 to 48 hours.    Medical decision making: Patient seen at 9:28 PM on 04/02/2016.  The patient was discussed with Dr. Ronnald Ramp and Dr. Vallery Ridge.  What exists of the patient's chart was reviewed in depth and summarized above.  Clinical condition: stable.        [facility] Pager [phone]

## 2016-04-02 NOTE — ED Notes (Signed)
Report given to Kim RN.

## 2016-04-02 NOTE — ED Notes (Signed)
Pt voided approx 161ml in bedpan.  Family remains at bedside.

## 2016-04-03 ENCOUNTER — Encounter (HOSPITAL_COMMUNITY): Payer: Self-pay | Admitting: *Deleted

## 2016-04-03 DIAGNOSIS — Z66 Do not resuscitate: Secondary | ICD-10-CM | POA: Diagnosis present

## 2016-04-03 DIAGNOSIS — M8448XD Pathological fracture, other site, subsequent encounter for fracture with routine healing: Secondary | ICD-10-CM | POA: Diagnosis not present

## 2016-04-03 DIAGNOSIS — C9000 Multiple myeloma not having achieved remission: Secondary | ICD-10-CM | POA: Diagnosis not present

## 2016-04-03 DIAGNOSIS — M40209 Unspecified kyphosis, site unspecified: Secondary | ICD-10-CM | POA: Diagnosis present

## 2016-04-03 DIAGNOSIS — Z85828 Personal history of other malignant neoplasm of skin: Secondary | ICD-10-CM | POA: Diagnosis not present

## 2016-04-03 DIAGNOSIS — C9030 Solitary plasmacytoma not having achieved remission: Secondary | ICD-10-CM | POA: Diagnosis not present

## 2016-04-03 DIAGNOSIS — I4581 Long QT syndrome: Secondary | ICD-10-CM | POA: Diagnosis present

## 2016-04-03 DIAGNOSIS — Z803 Family history of malignant neoplasm of breast: Secondary | ICD-10-CM | POA: Diagnosis not present

## 2016-04-03 DIAGNOSIS — D492 Neoplasm of unspecified behavior of bone, soft tissue, and skin: Secondary | ICD-10-CM | POA: Diagnosis not present

## 2016-04-03 DIAGNOSIS — R2681 Unsteadiness on feet: Secondary | ICD-10-CM | POA: Diagnosis not present

## 2016-04-03 DIAGNOSIS — R9431 Abnormal electrocardiogram [ECG] [EKG]: Secondary | ICD-10-CM | POA: Diagnosis present

## 2016-04-03 DIAGNOSIS — I10 Essential (primary) hypertension: Secondary | ICD-10-CM | POA: Diagnosis not present

## 2016-04-03 DIAGNOSIS — Z801 Family history of malignant neoplasm of trachea, bronchus and lung: Secondary | ICD-10-CM | POA: Diagnosis not present

## 2016-04-03 DIAGNOSIS — L89103 Pressure ulcer of unspecified part of back, stage 3: Secondary | ICD-10-CM | POA: Diagnosis not present

## 2016-04-03 DIAGNOSIS — F250 Schizoaffective disorder, bipolar type: Secondary | ICD-10-CM | POA: Diagnosis not present

## 2016-04-03 DIAGNOSIS — R279 Unspecified lack of coordination: Secondary | ICD-10-CM | POA: Diagnosis present

## 2016-04-03 DIAGNOSIS — C801 Malignant (primary) neoplasm, unspecified: Secondary | ICD-10-CM

## 2016-04-03 DIAGNOSIS — Z880 Allergy status to penicillin: Secondary | ICD-10-CM | POA: Diagnosis not present

## 2016-04-03 DIAGNOSIS — Z515 Encounter for palliative care: Secondary | ICD-10-CM

## 2016-04-03 DIAGNOSIS — M8448XA Pathological fracture, other site, initial encounter for fracture: Secondary | ICD-10-CM | POA: Diagnosis present

## 2016-04-03 DIAGNOSIS — M546 Pain in thoracic spine: Secondary | ICD-10-CM | POA: Diagnosis not present

## 2016-04-03 DIAGNOSIS — R222 Localized swelling, mass and lump, trunk: Secondary | ICD-10-CM | POA: Diagnosis not present

## 2016-04-03 DIAGNOSIS — M6281 Muscle weakness (generalized): Secondary | ICD-10-CM | POA: Diagnosis not present

## 2016-04-03 DIAGNOSIS — R718 Other abnormality of red blood cells: Secondary | ICD-10-CM | POA: Diagnosis not present

## 2016-04-03 DIAGNOSIS — M62838 Other muscle spasm: Secondary | ICD-10-CM | POA: Diagnosis not present

## 2016-04-03 DIAGNOSIS — Z8249 Family history of ischemic heart disease and other diseases of the circulatory system: Secondary | ICD-10-CM | POA: Diagnosis not present

## 2016-04-03 DIAGNOSIS — D7589 Other specified diseases of blood and blood-forming organs: Secondary | ICD-10-CM | POA: Diagnosis not present

## 2016-04-03 DIAGNOSIS — Z23 Encounter for immunization: Secondary | ICD-10-CM | POA: Diagnosis not present

## 2016-04-03 DIAGNOSIS — M549 Dorsalgia, unspecified: Secondary | ICD-10-CM | POA: Diagnosis not present

## 2016-04-03 DIAGNOSIS — Z888 Allergy status to other drugs, medicaments and biological substances status: Secondary | ICD-10-CM | POA: Diagnosis not present

## 2016-04-03 DIAGNOSIS — E785 Hyperlipidemia, unspecified: Secondary | ICD-10-CM | POA: Diagnosis present

## 2016-04-03 DIAGNOSIS — C7951 Secondary malignant neoplasm of bone: Secondary | ICD-10-CM

## 2016-04-03 DIAGNOSIS — Z79899 Other long term (current) drug therapy: Secondary | ICD-10-CM | POA: Diagnosis not present

## 2016-04-03 DIAGNOSIS — M810 Age-related osteoporosis without current pathological fracture: Secondary | ICD-10-CM | POA: Diagnosis present

## 2016-04-03 DIAGNOSIS — F200 Paranoid schizophrenia: Secondary | ICD-10-CM | POA: Diagnosis not present

## 2016-04-03 DIAGNOSIS — M899 Disorder of bone, unspecified: Secondary | ICD-10-CM | POA: Diagnosis not present

## 2016-04-03 DIAGNOSIS — R5383 Other fatigue: Secondary | ICD-10-CM | POA: Diagnosis present

## 2016-04-03 LAB — URINE CULTURE: Culture: NO GROWTH

## 2016-04-03 MED ORDER — INFLUENZA VAC SPLIT QUAD 0.5 ML IM SUSY
0.5000 mL | PREFILLED_SYRINGE | Freq: Once | INTRAMUSCULAR | Status: AC
  Administered 2016-04-03: 0.5 mL via INTRAMUSCULAR

## 2016-04-03 MED ORDER — OXYCODONE HCL 5 MG PO TABS
5.0000 mg | ORAL_TABLET | Freq: Four times a day (QID) | ORAL | Status: DC
  Administered 2016-04-03 – 2016-04-12 (×33): 5 mg via ORAL
  Filled 2016-04-03 (×34): qty 1

## 2016-04-03 MED ORDER — DEXAMETHASONE SODIUM PHOSPHATE 4 MG/ML IJ SOLN
4.0000 mg | Freq: Two times a day (BID) | INTRAMUSCULAR | Status: DC
  Administered 2016-04-03 – 2016-04-04 (×2): 4 mg via INTRAVENOUS
  Filled 2016-04-03 (×3): qty 1

## 2016-04-03 MED ORDER — METHOCARBAMOL 500 MG PO TABS
500.0000 mg | ORAL_TABLET | Freq: Three times a day (TID) | ORAL | Status: DC | PRN
  Administered 2016-04-03 – 2016-04-08 (×4): 500 mg via ORAL
  Filled 2016-04-03 (×5): qty 1

## 2016-04-03 NOTE — Progress Notes (Signed)
Palliative care nurse paged as requested by family. Earlier, pt's daughter had to be escorted off the unit by security and the police officer due to repeated episodes of upsetting the pt despite numerous times of being advised not to do so. Rest of family in room and there are some serious family dynamics that could only be resolved by removing the daughter from the pt's room

## 2016-04-03 NOTE — Clinical Social Work Note (Signed)
CSW informed by RN that pt son requesting to speak with CSW. CSW followed up and met with pt son in pt room. Pt's son expressed concerns about his mothers current living situation stating that he had concerns about her safety and that she is not receiving adequate medical care. Pt son stated that his mother currently lives with his sister who is the primary care giver, and that his sister has mental health issues that are impeding her ability to ensure that her mother is receiving proper medical attention. Pt son also stated he and his uncle are the point of contact regarding the pt's medical decisions as he and his uncle have POA over his mother. CSW called [facility] and to report medical neglect and safety concerns. Please refer to Pt son and brother for anything related to the pts care here in the hospital and pt medical needs as it pertains to discharge from the hospital.

## 2016-04-03 NOTE — Consult Note (Signed)
[place]  [phone]   HEMATOLOGY ONCOLOGY INPATIENT CONSULTATION   Mikayla Marks  DOB: May 14, 1928  MR#: [id number]  CSN#: [id number]    Requesting Physician: [facility]  Patient Care Team: Colon Branch, MD as PCP - General (Internal Medicine) Myeong Roxine Caddy, DPM as Consulting Physician (Podiatry)  Reason for consult: spinal mass   History of present illness:   Mikayla Marks a 82 y.o.femalewith a past medical history significant for paranoid schizophrenia and HTNwho presents with worsening back pain for one year, much worse for the past 6 months.   She went on a long car trip to [facility] last fall, and a developed intermittent mid back pain. She had some leg swollen and dyspnea during the trip, and was seen at the local emergency room. She had some image test, which showed a questionable thoracic spine lesion, and she was advised to follow up when she returns home. Apparently she did not follow-up. Due to her worsening back pain, she was referred to orthopedics in April 2017 and MRI of the thoracic and lumbar spine was done showed expansile lesion within the T8 and T9 vertebral bodies with associated pathological fractures and osseous retropulsion. She was referred to our cancer center, but did not make her appointment. She lives with her daughter [name], who is very against medical treatment and did not bring her in.  Her back pain has became debilitating lately, she is able to walk for short distance, but has not been able to perform her usual daily activities. Her appetite and energy level has also slightly decreased, she lost about 15 pounds in the past few months. She denies any other pain, cough, dyspnea, abdominal discomfort, fever, chills, or night sweats. She denies any weakness, numbness or tingling on her legs, no urinary or stool incontinence. Her last mammogram was about 2 years ago per patient. She has no significant family  history of malignancy except prostate cancer in her brother.  Apparently her schizophrenia is fairly controlled, she is to be very independent, and functions well at home. She has a son Merica Prell, who is a gynecologist in [place]. her power of attorney is her brother. I met pt, her brother, son and daughter-in-law in her room today.     MEDICAL HISTORY:  Past Medical History:  Diagnosis Date  . Hyperlipidemia   . HYPERTENSION   . Osteoporosis   . SCC (squamous cell carcinoma)    in situ R pretibial area  . Schizoaffective disorder ([place])     SURGICAL HISTORY: Past Surgical History:  Procedure Laterality Date  . BREAST BIOPSY     benign tumor  . CATARACT EXTRACTION    . CHOLECYSTECTOMY    . LASER ABLATION Left    left LE   . LEG SURGERY Right 10/2012   Ruptured Veins  . PARTIAL HYSTERECTOMY     due to bleeding  . TONSILLECTOMY      SOCIAL HISTORY: Social History   Social History  . Marital status: Single    Spouse name: N/A  . Number of children: 3  . Years of education: N/A   Occupational History  . not working    Social History Main Topics  . Smoking status: Never Smoker  . Smokeless tobacco: Never Used  . Alcohol use No  . Drug use: No  . Sexual activity: No   Other Topics Concern  . Not on file   Social History Narrative   Daughter lives  with her [name])     still drives   widow   Brother [name] in [place], lives 5 min away [phone]          FAMILY HISTORY: Family History  Problem Relation Age of Onset  . Heart Problems Mother   . Colon cancer Neg Hx   . Breast cancer Neg Hx   . CAD Neg Hx     ALLERGIES:  is allergic to other; bacitracin-polymyxin b; and penicillins.  MEDICATIONS:  Current Facility-Administered Medications  Medication Dose Route Frequency Provider Last Rate Last Dose  . acetaminophen (TYLENOL) tablet 650 mg  650 mg Oral Q6H PRN Edwin Dada, MD       Or  . acetaminophen (TYLENOL) suppository 650 mg  650 mg  Rectal Q6H PRN Edwin Dada, MD      . docusate sodium (COLACE) capsule 100 mg  100 mg Oral BID Edwin Dada, MD   100 mg at 04/03/16 0903  . methocarbamol (ROBAXIN) tablet 500 mg  500 mg Oral Q8H PRN Caren Griffins, MD   500 mg at 04/03/16 0903  . morphine 4 MG/ML injection 4 mg  4 mg Intravenous Q3H PRN Charlesetta Shanks, MD   4 mg at 04/03/16 1139  . oxyCODONE (Oxy IR/ROXICODONE) immediate release tablet 5 mg  5 mg Oral Q4H PRN Edwin Dada, MD   5 mg at 04/03/16 0700  . polyethylene glycol (MIRALAX / GLYCOLAX) packet 17 g  17 g Oral Daily PRN Edwin Dada, MD      . senna (SENOKOT) tablet 8.6 mg  1 tablet Oral BID Edwin Dada, MD   8.6 mg at 04/03/16 0903    REVIEW OF SYSTEMS:   Constitutional: Denies fevers, chills or abnormal night sweats Eyes: Denies blurriness of vision, double vision or watery eyes Ears, nose, mouth, throat, and face: Denies mucositis or sore throat Respiratory: Denies cough, dyspnea or wheezes Cardiovascular: Denies palpitation, chest discomfort or lower extremity swelling Gastrointestinal:  Denies nausea, heartburn or change in bowel habits Skin: Denies abnormal skin rashes Lymphatics: Denies new lymphadenopathy or easy bruising Neurological:Denies numbness, tingling or new weaknesses Behavioral/Psych: Mood is stable, no new changes  All other systems were reviewed with the patient and are negative.  PHYSICAL EXAMINATION: ECOG PERFORMANCE STATUS: 3 - Symptomatic, >50% confined to bed  Vitals:   04/02/16 2311 04/03/16 0640  BP: (!) 160/83 (!) 133/53  Pulse: 90 72  Resp: 18 18  Temp: 97.9 F (36.6 C) 97.7 F (36.5 C)   Filed Weights   04/02/16 1404 04/02/16 2311  Weight: 161 lb (73 kg) 141 lb (64 kg)    GENERAL:alert, no distress and comfortable SKIN: skin color, texture, turgor are normal, no rashes, diffuse skin moles or EYES: normal, conjunctiva are pink and non-injected, sclera clear OROPHARYNX:no  exudate, no erythema and lips, buccal mucosa, and tongue normal  NECK: supple, thyroid normal size, non-tender, without nodularity LYMPH:  no palpable lymphadenopathy in the cervical, axillary or inguinal LUNGS: clear to auscultation and percussion with normal breathing effort HEART: regular rate & rhythm and no murmurs and no lower extremity edema ABDOMEN:abdomen soft, non-tender and normal bowel sounds Musculoskeletal:no cyanosis of digits and no clubbing  PSYCH: alert & oriented x 3 with fluent speech NEURO: no focal motor/sensory deficits Breasts: Breast inspection showed them to be symmetrical with no nipple discharge. Palpation of the breasts and axilla revealed no obvious mass that I could appreciate.   LABORATORY DATA:  I  have reviewed the data as listed Lab Results  Component Value Date   WBC 7.0 04/02/2016   HGB 13.9 04/02/2016   HCT 42.1 04/02/2016   MCV 93.1 04/02/2016   PLT 317 04/02/2016    Recent Labs  08/07/15 1127 11/11/15 1623 04/02/16 1723  NA 140 140 138  K 4.3 3.6 3.8  CL 104 103 103  CO2 27 28 24   GLUCOSE 86 105* 109*  BUN 20 18 12   CREATININE 1.01 0.93 0.87  CALCIUM 9.7 9.6 9.9  GFRNONAA  --   --  59*  GFRAA  --   --  >60  PROT  --  7.4 7.0  ALBUMIN  --  4.2 4.0  AST 19 14 22   ALT 13 14 19   ALKPHOS  --  80 17  BILITOT  --  0.5 0.7  BILIDIR  --  0.0  --     RADIOGRAPHIC STUDIES: I have personally reviewed the radiological images as listed and agreed with the findings in the report. Dg Chest 2 View  Result Date: 04/02/2016 CLINICAL DATA:  Severe back pain.  Left-sided chest pain today. EXAM: CHEST  2 VIEW COMPARISON:  Chest radiograph 11/11/2015, spine MRI 10/15/2015 FINDINGS: The cardiac silhouette is enlarged. Mediastinal contours appear intact. There is no evidence of pneumothorax. There has been interval development of moderate in size left pleural effusion. Smaller right subpulmonic effusion is also possible. There is mild interstitial  prominence bilaterally. If there is a significantly exaggerated thoracic kyphosis centered in the second third of the thoracic spine. The thoracic vertebrae are not well evaluated due to osteopenia and positioning, but new or advanced compression fracture is suspected based on the exaggerated kyphotic deformity. IMPRESSION: Moderate in size left pleural effusion. Possible small right subpulmonic pleural effusion. Mild pulmonary vascular congestion. Significantly exaggerated thoracic kyphosis when compared to patient's prior chest radiograph and MRI,which is suggestive of new or worsening compression fracture of one or more thoracic vertebral bodies.The thoracic vertebrae are not well evaluated due to osteopenia and positioning. Patient has a known T8, T9, and T11 expansile lesions with associated pathologic fractures of T8 and T9 demonstrated by MRI in April 2017. Electronically Signed   By: Fidela Salisbury M.D.   On: 04/02/2016 17:42   Ct Head Wo Contrast  Result Date: 04/02/2016 CLINICAL DATA:  Severe back pain. Compression fracture diagnosed in April. Pain is worsening today. EXAM: CT HEAD WITHOUT CONTRAST CT CERVICAL SPINE WITHOUT CONTRAST TECHNIQUE: Multidetector CT imaging of the head and cervical spine was performed following the standard protocol without intravenous contrast. Multiplanar CT image reconstructions of the cervical spine were also generated. COMPARISON:  CT head 11/30/2006 FINDINGS: CT HEAD FINDINGS Brain: Mild diffuse cerebral atrophy. Ventricular dilatation consistent with central atrophy. Low-attenuation changes in the deep white matter consistent with small vessel ischemia. No evidence of acute infarction, hemorrhage, hydrocephalus, extra-axial collection or mass lesion/mass effect. Vascular: No hyperdense vessel or unexpected calcification. Skull: Normal. Negative for fracture or focal lesion. Sinuses/Orbits: No acute finding. Degenerative changes in the temporomandibular joints.  Other: No significant changes since previous study. CT CERVICAL SPINE FINDINGS Alignment: Slight anterior subluxation of C7 on T1 is likely degenerative. Otherwise normal alignment of the cervical spine. Skull base and vertebrae: No acute fracture. No primary bone lesion or focal pathologic process. Soft tissues and spinal canal: No prevertebral fluid or swelling. No visible canal hematoma. Disc levels: Degenerative changes throughout the cervical spine with narrowed interspaces and endplate hypertrophic changes. Degenerative disc disease at  C5-6 and C6-7 levels. Prominent posterior osteophytes at C5-6 and C6-7 cause some impression upon the central canal. C1-2 articulation appears intact. Congenital nonunion of the posterior arch of C1. Degenerative changes throughout the cervical facet joints with posterior coalition at C4-5. Upper chest: There is a fracture of the posterior right first rib but associated with a mildly expansile lucent lesions suggesting a pathologic fracture. Appearance is suspicious for metastasis. Other: None. IMPRESSION: Diffuse degenerative changes throughout the cervical spine. No acute displaced cervical spine fractures identified. There is a pathologic fracture of the right posterior first rib. Electronically Signed   By: Lucienne Capers M.D.   On: 04/02/2016 20:01   Ct Chest W Contrast  Result Date: 04/02/2016 CLINICAL DATA:  Pt reports to the ED for eval of back pain. Pt had some compression fx diagnosed in April. The pain got worse last night and into today. Pt able to move all extremities. Pt denies any numbness, tingling, paralysis, or bowel or bladder incontinence EXAM: CT CHEST, ABDOMEN, AND PELVIS WITH CONTRAST TECHNIQUE: Multidetector CT imaging of the chest, abdomen and pelvis was performed following the standard protocol during bolus administration of intravenous contrast. CONTRAST:  100 mL of Isovue-300 intravenous contrast COMPARISON:  Chest CT, 08/22/2014 FINDINGS: CT  CHEST FINDINGS Cardiovascular: Heart is normal in size. There are mild coronary artery calcifications. Great vessels normal in caliber. Mild atherosclerotic plaque is noted along the aortic arch and descending thoracic aorta. No significant stenosis. Mediastinum/Nodes: 14 mm nodule lies in the posterior left thyroid lobe, stable. No mediastinal or hilar masses or enlarged lymph nodes. Lungs/Pleura: Small left a minimal right pleural effusions. There is dependent opacity at the lung bases, most notably in the left lower lobe. This is likely atelectasis. Pneumonia is possible. There is no evidence of pulmonary edema. No lung mass or suspicious nodule. No pneumothorax. CT ABDOMEN PELVIS FINDINGS Hepatobiliary: Liver is unremarkable. There is intra and extra hepatic bile duct dilation. Common bile duct measures 12 mm in greatest dimension. No convincing duct stone. Gallbladder surgically absent. Pancreas: No pancreatic mass or inflammation. Mild dilation of the pancreatic duct. Spleen: Normal in size without focal abnormality. Adrenals/Urinary Tract: Mild bilateral renal cortical thinning. No renal masses or stones. No hydronephrosis. Ureters normal in course and in caliber. Bladder is unremarkable. Stomach/Bowel: Stomach and small bowel are unremarkable. Mild increased stool in the colon. No colonic wall thickening or inflammatory change. Appendix not visualized. No evidence of appendicitis. Vascular/Lymphatic: Mild atherosclerotic calcifications noted along a normal caliber abdominal aorta. No enlarged lymph nodes. Reproductive: Status post hysterectomy. No adnexal masses. Other: No abdominal wall hernia or abnormality. No abdominopelvic ascites. MUSCULOSKELETAL FINDINGS There is a destructive mass arising from the T8 and T9 vertebrae extending to involve the lower aspect of the T7 vertebra and the upper endplate of U88 as well. Mass measures 4.1 x 5.1 x 4.9 cm in size. It extends posteriorly into the spinal canal to  cause severe central spinal stenosis. It also extends into the neural foramina, right greater than left causing significant narrowing. There are no other bone lesions. The bones are diffusely demineralized. IMPRESSION: 1. There is a destructive spinal mass centered on the T8 and T9 vertebrae, but also involving the lower aspect of the T7 vertebra and upper aspect of T10. The mass causes severe central stenosis and significant narrowing of the neural foramina. This mass may reflect a primary bone neoplasm such as a plasmacytoma. A metastatic lesion is possible although a primary neoplasm is not visualized.  Followup thoracic spine MRI with and without contrast to assess the extent of cord compression is recommended. 2. No other bone lesions. 3. Small left and minimal right pleural effusions. There is associated dependent atelectasis most evident in the left lower lobe. 4. No acute abnormalities below the diaphragm. Electronically Signed   By: Lajean Manes M.D.   On: 04/02/2016 20:10   Ct Cervical Spine Wo Contrast  Result Date: 04/02/2016 CLINICAL DATA:  Severe back pain. Compression fracture diagnosed in April. Pain is worsening today. EXAM: CT HEAD WITHOUT CONTRAST CT CERVICAL SPINE WITHOUT CONTRAST TECHNIQUE: Multidetector CT imaging of the head and cervical spine was performed following the standard protocol without intravenous contrast. Multiplanar CT image reconstructions of the cervical spine were also generated. COMPARISON:  CT head 11/30/2006 FINDINGS: CT HEAD FINDINGS Brain: Mild diffuse cerebral atrophy. Ventricular dilatation consistent with central atrophy. Low-attenuation changes in the deep white matter consistent with small vessel ischemia. No evidence of acute infarction, hemorrhage, hydrocephalus, extra-axial collection or mass lesion/mass effect. Vascular: No hyperdense vessel or unexpected calcification. Skull: Normal. Negative for fracture or focal lesion. Sinuses/Orbits: No acute finding.  Degenerative changes in the temporomandibular joints. Other: No significant changes since previous study. CT CERVICAL SPINE FINDINGS Alignment: Slight anterior subluxation of C7 on T1 is likely degenerative. Otherwise normal alignment of the cervical spine. Skull base and vertebrae: No acute fracture. No primary bone lesion or focal pathologic process. Soft tissues and spinal canal: No prevertebral fluid or swelling. No visible canal hematoma. Disc levels: Degenerative changes throughout the cervical spine with narrowed interspaces and endplate hypertrophic changes. Degenerative disc disease at C5-6 and C6-7 levels. Prominent posterior osteophytes at C5-6 and C6-7 cause some impression upon the central canal. C1-2 articulation appears intact. Congenital nonunion of the posterior arch of C1. Degenerative changes throughout the cervical facet joints with posterior coalition at C4-5. Upper chest: There is a fracture of the posterior right first rib but associated with a mildly expansile lucent lesions suggesting a pathologic fracture. Appearance is suspicious for metastasis. Other: None. IMPRESSION: Diffuse degenerative changes throughout the cervical spine. No acute displaced cervical spine fractures identified. There is a pathologic fracture of the right posterior first rib. Electronically Signed   By: Lucienne Capers M.D.   On: 04/02/2016 20:01   Ct Abdomen Pelvis W Contrast  Result Date: 04/02/2016 CLINICAL DATA:  Pt reports to the ED for eval of back pain. Pt had some compression fx diagnosed in April. The pain got worse last night and into today. Pt able to move all extremities. Pt denies any numbness, tingling, paralysis, or bowel or bladder incontinence EXAM: CT CHEST, ABDOMEN, AND PELVIS WITH CONTRAST TECHNIQUE: Multidetector CT imaging of the chest, abdomen and pelvis was performed following the standard protocol during bolus administration of intravenous contrast. CONTRAST:  100 mL of Isovue-300  intravenous contrast COMPARISON:  Chest CT, 08/22/2014 FINDINGS: CT CHEST FINDINGS Cardiovascular: Heart is normal in size. There are mild coronary artery calcifications. Great vessels normal in caliber. Mild atherosclerotic plaque is noted along the aortic arch and descending thoracic aorta. No significant stenosis. Mediastinum/Nodes: 14 mm nodule lies in the posterior left thyroid lobe, stable. No mediastinal or hilar masses or enlarged lymph nodes. Lungs/Pleura: Small left a minimal right pleural effusions. There is dependent opacity at the lung bases, most notably in the left lower lobe. This is likely atelectasis. Pneumonia is possible. There is no evidence of pulmonary edema. No lung mass or suspicious nodule. No pneumothorax. CT ABDOMEN PELVIS FINDINGS Hepatobiliary:  Liver is unremarkable. There is intra and extra hepatic bile duct dilation. Common bile duct measures 12 mm in greatest dimension. No convincing duct stone. Gallbladder surgically absent. Pancreas: No pancreatic mass or inflammation. Mild dilation of the pancreatic duct. Spleen: Normal in size without focal abnormality. Adrenals/Urinary Tract: Mild bilateral renal cortical thinning. No renal masses or stones. No hydronephrosis. Ureters normal in course and in caliber. Bladder is unremarkable. Stomach/Bowel: Stomach and small bowel are unremarkable. Mild increased stool in the colon. No colonic wall thickening or inflammatory change. Appendix not visualized. No evidence of appendicitis. Vascular/Lymphatic: Mild atherosclerotic calcifications noted along a normal caliber abdominal aorta. No enlarged lymph nodes. Reproductive: Status post hysterectomy. No adnexal masses. Other: No abdominal wall hernia or abnormality. No abdominopelvic ascites. MUSCULOSKELETAL FINDINGS There is a destructive mass arising from the T8 and T9 vertebrae extending to involve the lower aspect of the T7 vertebra and the upper endplate of I01 as well. Mass measures 4.1 x  5.1 x 4.9 cm in size. It extends posteriorly into the spinal canal to cause severe central spinal stenosis. It also extends into the neural foramina, right greater than left causing significant narrowing. There are no other bone lesions. The bones are diffusely demineralized. IMPRESSION: 1. There is a destructive spinal mass centered on the T8 and T9 vertebrae, but also involving the lower aspect of the T7 vertebra and upper aspect of T10. The mass causes severe central stenosis and significant narrowing of the neural foramina. This mass may reflect a primary bone neoplasm such as a plasmacytoma. A metastatic lesion is possible although a primary neoplasm is not visualized. Followup thoracic spine MRI with and without contrast to assess the extent of cord compression is recommended. 2. No other bone lesions. 3. Small left and minimal right pleural effusions. There is associated dependent atelectasis most evident in the left lower lobe. 4. No acute abnormalities below the diaphragm. Electronically Signed   By: Lajean Manes M.D.   On: 04/02/2016 20:10    ASSESSMENT & PLAN:  It is 81-year-old Caucasian female with past medical history of schizophrenia and hypertension, but independent and functioning at home, presents with worsening mid back pain, and growing mass in her T8 and T9 vertebral bodies.  1. 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 The tumor in her thoracic T8 and 9 vertebral bodies had significantly grown since April 2017, which causes significant central stenosis, and clinically she is in severe pain. This is highly suspicious for malignancy, especially plasmocytoma or metastatic cancer. -Her CT chest, abdomen and pelvis with contrast did not show any primary tumor. Her physical exam was  negative for peripheral adenopathy or breast mass. -Her lab showed no elevated total protein, no anemia, renal failure, hypercalcemia, no signs of multiple myeloma. However plasmacytoma can be nonsecretory.  -I recommend a CT-guided biopsy to confirm diagnosis. I had long conversation with patient and her family members regarding the possibility of her diagnosis, and potential treatment options. Although she is not a candidate for systemic chemotherapy, we do have other more tolerable treatment options for multiple myeloma, metastatic breast cancer, etc. She voiced good understanding him a and both patient and her family members agreed to biopsy. -I also recommend palliative radiation to improve her pain. I'll refer her to radiation oncology. -We also discussed the logistics about  her treatment arrangement. She lives with her daughter [name], who is manic and against any medical treatment, I would favor inpatient radiation if her radiation can be delivered as a short course. -We'll start her on dexamethasone 4 mg every 12 hours for, to reduce her tumor induced inflammation and nerve compression. We need to watch serous induced psychosis given her underlying schizophrenia.  2. HTN 3. Schizophrenia, controlled  Recommendations: -IR CT-guided transthoracic spine mass biopsy tomorrow, please send for surgical path and flow also  -I will order multiple myeloma panel and light chain levels  -Start dexamethasone 4 mg every 12 hours, please watch her for steroids induced psychosis  -I will call rad/onc -I will see her back when I have preliminary result from her biopsy.   All questions were answered. The patient knows to call the clinic with any problems, questions or concerns.     Truitt Merle, MD 04/03/2016 2:17 PM

## 2016-04-03 NOTE — Consult Note (Signed)
Consultation Note Date: 04/03/2016   Patient Name: Mikayla Marks  DOB: 12/07/1927  MRN: [id number]  Age / Sex: 82 y.o., female  PCP: Colon Branch, MD Referring Physician: Caren Griffins, MD  Reason for Consultation: Establishing goals of care, Non pain symptom management, Pain control and Psychosocial/spiritual support  HPI/Patient Profile: 82 y.o. female  with past medical history of Paranoid schizophrenia, hypertension, prolonged QT interval, severe back pain starting in April 2017 admitted on 04/02/2016 with severe back pain. Per chart review, MRI of spine in April 2017 showed "lesions within the T8 and T9 vertebral bodies with associated pathologic fractures and osseous retropulsion worrisome for metastatic disease, myeloma, or lymphoma"'s. She was referred to oncology at that time but did not follow-up. On 10/26/2015 she saw her PCP because of ongoing pain and again was referred to oncology which she refused. On 11/11/2015 she saw pulmonary for pleural effusions and was additionally referred to oncology, as well as to have a PET scan and biopsy of mass seen on T8 and T9. Patient again refused. On 12/08/2015 she again saw her PCP and was for referred to hematology and oncology and she declined. Repeat CT scan shows destructive spinal mass at T8 and T9 but also involving T7 and T10. This mass is causing severe central stenosis and narrowing; also pathological fracture of right first rib. In April of this year MRI did not show any evidence of spinal cord compression. Patient is still able to walk, but is standing in a bent position because of severe pain In speaking with her family her schizophrenia as well as her daughter's mental illness (with whom she lives) has directly impacted her ability to follow through with outpatient workup for work up, what is likely a cancerous mass. Patient as well as her daughter share  paranoid delusions regarding toxic fumes, nuclear waste, thinking that [facility] is not a real  hospitals, and people who are identifying themselves as physicians are imposters. On 04/03/2016 patient's daughter [name], had to be escorted off the premises by security because of out-of-control behavior. She was instructed that she  could not return to the unit.   Clinical Assessment and Goals of Care: Met with patient, her brother [name], and son Percell Miller and his wife. Patient was alert and pleasant during our discussion. She is able to carry on a conversation about receiving radiation treatment to her back for palliative pain relief as well as biopsy of mass without eliciting any delusions. She is agreeable at this point for a biopsy of mass which is scheduled for 04/04/2016 as well as what will hopefully be inpatient radiation treatments to her back for pain control. Her family shares concerns that if she were to go home without having these treatments performed in-pt she will be lost to follow-up because of aforementioned delusions that are both shared by the patient as well as by her daughter with whom she lives.  Patient's brother Nehemiah Massed is her surrogate Media planner. Her son Ece Cumberland is back up decision  maker. He lives in [place]. Family is requesting that daughter Chancy Claros not receive medical information and is noted not to visit on the unit. Patient herself did not verbalize to this writer that she fears being in her daughter's company or that she is living in an unsafe situation    SUMMARY OF RECOMMENDATIONS   DNR/DNI Continue with biopsy of mass on 04/04/2016 Plan to meet with radiation oncology on 04/04/2016 Hopefully will undergo inpatient palliative radiation to spine to preserve mobility as well as for pain control Patient was seen by Dr. Burr Medico with oncology today and started on Decadron Code Status/Advance Care Planning:  DNR    Symptom Management:   Pain:  Patient has been taking approximately 4-6 tablets of Tylenol No. 3 daily with minimal relief of severe back pain as well as right rib pain. She describes her pain is worsening with movement, always present, and can have a sharp sudden grabbing quality particularly to the right rib area. It does achieve a 10 of 10 and a 24-hour period and the best it has been is a 3 of 10 which she reports is tolerable. Morphine 4 mg IV was very effective in relieving patient's pain for approximately 4 hours. Continue with this on an as-needed basis. We'll start scheduled oxycodone 5 mg every 6 hours and utilize morphine IV 4 mg every 3 hours as needed for breakthrough pain or oxycodone 5 mg. Patient absolutely will need premedication prior to any procedures, exams or extensive ADL such as bathing.  Schizophrenia: Patient is not on any antipsychotics. You have to specifically ask her regarding her delusions in order to elicit these. She previously has been on Haldol 0.5 mg twice daily and has had one inpatient psychiatric hospitalization approximately 5 years ago. Per that note there was no history of suicidal or homicidal ideation  Palliative Prophylaxis:   Aspiration, Bowel Regimen, Delirium Protocol, Frequent Pain Assessment, Oral Care and Turn Reposition  Additional Recommendations (Limitations, Scope, Preferences):  No Surgical Procedures and No Tracheostomy  Psycho-social/Spiritual:   Desire for further Chaplaincy support:no  Additional Recommendations: Grief/Bereavement Support  Prognosis:   Unable to determine  Discharge Planning: To Be Determined      Primary Diagnoses: Present on Admission: . Intractable back pain . Cancer with unknown primary site [facility]) . Schizophrenia ([place]) . Essential hypertension . Prolonged QT interval   I have reviewed the medical record, interviewed the patient and family, and examined the patient. The following aspects are pertinent.  Past Medical History:    Diagnosis Date  . Hyperlipidemia   . HYPERTENSION   . Osteoporosis   . SCC (squamous cell carcinoma)    in situ R pretibial area  . Schizoaffective disorder [facility])    Social History   Social History  . Marital status: Single    Spouse name: N/A  . Number of children: 3  . Years of education: N/A   Occupational History  . not working    Social History Main Topics  . Smoking status: Never Smoker  . Smokeless tobacco: Never Used  . Alcohol use No  . Drug use: No  . Sexual activity: No   Other Topics Concern  . None   Social History Narrative   Daughter lives with her [name])     still drives   widow   Brother [name] in [place], lives 5 min away [phone]         Family History  Problem Relation Age of Onset  . Heart  Problems Mother   . Colon cancer Neg Hx   . Breast cancer Neg Hx   . CAD Neg Hx    Scheduled Meds: . dexamethasone  4 mg Intravenous Q12H  . docusate sodium  100 mg Oral BID  . oxyCODONE  5 mg Oral Q6H  . senna  1 tablet Oral BID   Continuous Infusions:  PRN Meds:.acetaminophen **OR** acetaminophen, methocarbamol, morphine injection, oxyCODONE, polyethylene glycol Medications Prior to Admission:  Prior to Admission medications   Medication Sig Start Date End Date Taking? Authorizing Provider  acetaminophen-codeine (TYLENOL #3) 300-30 MG tablet Take 1 tablet by mouth every 8 (eight) hours as needed for severe pain. 03/23/16  Yes Colon Branch, MD  Calcium Carbonate (CALTRATE 600 PO) Take 1 tablet by mouth daily.    Yes Historical Provider, MD  cycloSPORINE (RESTASIS) 0.05 % ophthalmic emulsion Place 1 drop into both eyes 2 (two) times daily.   Yes Historical Provider, MD  Liniments (SALONPAS EX) Apply 1 patch topically as needed (for pain).   Yes Historical Provider, MD  Multiple Vitamins-Minerals (MULTIVITAMIN WITH MINERALS) tablet Take 1 tablet by mouth 4 (four) times a week.    Yes Historical Provider, MD  Probiotic Product (PROBIOTIC PO) Take 2  capsules by mouth daily.   Yes Historical Provider, MD  tiZANidine (ZANAFLEX) 4 MG tablet Take 4-8 mg by mouth daily as needed for muscle spasms.   Yes Historical Provider, MD   Allergies  Allergen Reactions  . Other Other (See Comments)    5FU cream - stroke like symptoms  . Bacitracin-Polymyxin B Other (See Comments)    Reaction unknown.   Marland Kitchen Penicillins Rash   Review of Systems  Constitutional: Positive for activity change and appetite change.  HENT: Negative.   Eyes: Negative.   Respiratory: Negative.   Cardiovascular: Negative.   Gastrointestinal: Negative.   Endocrine: Negative.   Genitourinary: Negative.   Musculoskeletal: Positive for back pain.       Right side pain at ribs Kyphosis  Allergic/Immunologic: Negative.   Neurological: Positive for weakness.  Hematological: Negative.   Psychiatric/Behavioral: Positive for dysphoric mood and sleep disturbance. The patient is nervous/anxious.     Physical Exam  Constitutional:  Petite frail elderly female lying in bed  HENT:  Head: Normocephalic and atraumatic.  Pulmonary/Chest: Effort normal.  Neurological: She is alert.  Skin: Skin is warm and dry.  Psychiatric:  Pt is somewhat hyper verbal, circumstantial She can maintain a conversation but if asked, you elicit very specific delusions regarding being poisoned, toxic fumes in her house, neighborhood under nuclear attack. Per chart review of psychiatric admission in 2012 these are fixed, long standing and elaborate  Nursing note and vitals reviewed.   Vital Signs: BP (!) 118/50 (BP Location: Left Arm)   Pulse 86   Temp 97.9 F (36.6 C) (Oral)   Resp 18   Ht 5' (1.524 m)   Wt 64 kg (141 lb)   SpO2 95%   BMI 27.54 kg/m  Pain Assessment: 0-10   Pain Score: 7    SpO2: SpO2: 95 % O2 Device:SpO2: 95 % O2 Flow Rate: .   IO: Intake/output summary:  Intake/Output Summary (Last 24 hours) at 04/03/16 1819 Last data filed at 04/03/16 1500  Gross per 24 hour    Intake              360 ml  Output              875 ml  Net             -515 ml    LBM: Last BM Date: 04/01/16 Baseline Weight: Weight: 73 kg (161 lb) Most recent weight: Weight: 64 kg (141 lb)     Palliative Assessment/Data:   Flowsheet Rows   Flowsheet Row Most Recent Value  Intake Tab  Referral Department  Hospitalist  Unit at Time of Referral  Med/Surg Unit  Palliative Care Primary Diagnosis  Cancer  Date Notified  04/02/16  Palliative Care Type  New Palliative care  Reason for referral  Pain, Clarify Goals of Care, Psychosocial or Spiritual support  Date of Admission  04/02/16  Date first seen by Palliative Care  04/03/16  # of days Palliative referral response time  1 Day(s)  # of days IP prior to Palliative referral  0  Clinical Assessment  Palliative Performance Scale Score  40%  Pain Max last 24 hours  10  Pain Min Last 24 hours  3  Dyspnea Max Last 24 Hours  0  Dyspnea Min Last 24 hours  0  Nausea Max Last 24 Hours  0  Nausea Min Last 24 Hours  0  Anxiety Max Last 24 Hours  0  Anxiety Min Last 24 Hours  0  Psychosocial & Spiritual Assessment  Palliative Care Outcomes  Patient/Family meeting held?  Yes  Who was at the meeting?  son, brother, DIL  Palliative Care Outcomes  Improved pain interventions, Clarified goals of care, Changed CPR status, Provided psychosocial or spiritual support  Patient/Family wishes: Interventions discontinued/not started   Mechanical Ventilation, BiPAP, Trach, NIPPV  Palliative Care follow-up planned  Yes, Facility      Time In: 1400 Time Out: 1530 Time Total: 90 min Greater than 50%  of this time was spent counseling and coordinating care related to the above assessment and plan.  Signed by: Dory Horn, NP   Please contact Palliative Medicine Team phone at [phone] for questions and concerns.  For individual provider: See Shea Evans

## 2016-04-03 NOTE — Progress Notes (Addendum)
PROGRESS NOTE  DALEXA CLOOS [id number] DOB: 10-22-1927 DOA: 04/02/2016 PCP: Kathlene November, MD   LOS: 0 days   Brief Narrative: Mikayla Marks is a 82 y.o. female with a past medical history significant for paranoid schizophrenia and HTN who presents with back pain for 4 months. The patient was in her usual state of health until around mid-February when she went on a long car ride with her daughter and developed some back pain.  By April, this back pain had gotten much worse and she had a new severe kyphosis:  Mid April 2017: Seen at [facility], Dr. Mina Marble PM&R, MRI spine ordered which showed "lesions within the T8 and T9 vertebral bodies with associated pathologic fractures and osseous retropulsion...worrisome for metastatic disease, myeloma, or lymphoma".  Was referred to Oncology, did not call back. 10/26/15: Saw PCP in follow up, was recommended to see Oncology, refused. 11/11/15: Saw Pulmonology as self-referral for pleural effusions (noted incidentally on MRI of spine that showed metastasis) was recommended to see Oncology, have PET and biopsy via IR, refused 12/08/15: Saw PCP again, SPEP ordered and no M-spike (only "band of restricted mobility" in beta globulins), again referral to Hematology-Oncology and also IR that week June 2017: Saw Orthopedic surgery who recommended biopsy, family and patient declined again  Assessment & Plan: Principal Problem:   Intractable back pain Active Problems:   Schizophrenia ([place])   Essential hypertension   Cancer with unknown primary site [facility])   Prolonged QT interval   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  She is currently not an operative candidate. - continue oxycodone, add robaxin this morning -  long discussion with patient and her daughter [name] at bedside regarding how to proceed further , ie oncology consult, biopsy vs symptom control and d/c home and not pursue treatment further - They do not grasp the gravity of the situation. Patient is more concerned about being irradiated as there is a "drug gang" in her neighborhood that carries around "alcohol, drugs and nuclear warheads" and she is exposed to radiation. She points to her legs that are more red and thinks this is radiation induced changes. The daughter bedside does not contradict her mother and states "we should be whiter on the skin, we are indoors all the time so this is radiation" - appreciate palliative input, discussed with Dr. Rowe Pavy  Addendum: long discussion with Dr. Wendelyn Breslow, patient's son at bedside, he had a good understanding of her medical condition. While he realizes that this is potentially not curable would like to seek out diagnostic information and what palliative options are available. Will consult Oncology, discussed with Dr. Burr Medico. Requested IR to biopsy.   HTN Hypertensive at admission, suspect chronic plus pain related.  - BP better this morning  Paranoid schizophrenia - Not in active treatment, she probably should be, see above  Prolonged QTc - Avoid QT prolonging agents  Social - very difficult situation as patient is under care of daughter [name] who -- based on my limited interaction -- does appear to have underlying psychiatric issues as well. It is quite clear that patient did not received adequate care in the past 6 months since her spinal tumor diagnosis. Under these circumstances she currently does not have a safe discharge plan  and is not safe to go home. Contacted SW for assistance.   DVT prophylaxis: SCD Code Status: Partial Family Communication: daughter bedside Disposition Plan: TBD  Consultants:   Palliative  Neurosurgery  Procedures:   None   Antimicrobials:  None     Subjective: - no chest pain, shortness of breath, no abdominal pain, nausea or vomiting.  - complains of neck pain  Objective: Vitals:   04/02/16 2230 04/02/16 2245 04/02/16 2311 04/03/16 0640  BP: 167/74 164/68 (!) 160/83 (!) 133/53  Pulse: (!) 50 86 90 72  Resp:   18 18  Temp:   97.9 F (36.6 C) 97.7 F (36.5 C)  TempSrc:   Oral Oral  SpO2: 96% 95% 94% 95%  Weight:   64 kg (141 lb)   Height:   5' (1.524 m)     Intake/Output Summary (Last 24 hours) at 04/03/16 0848 Last data filed at 04/03/16 0500  Gross per 24 hour  Intake                0 ml  Output              200 ml  Net             -200 ml   Filed Weights   04/02/16 1404 04/02/16 2311  Weight: 73 kg (161 lb) 64 kg (141 lb)    Examination: Constitutional: NAD Vitals:   04/02/16 2230 04/02/16 2245 04/02/16 2311 04/03/16 0640  BP: 167/74 164/68 (!) 160/83 (!) 133/53  Pulse: (!) 50 86 90 72  Resp:   18 18  Temp:   97.9 F (36.6 C) 97.7 F (36.5 C)  TempSrc:   Oral Oral  SpO2: 96% 95% 94% 95%  Weight:   64 kg (141 lb)   Height:   5' (1.524 m)    Respiratory: clear to auscultation bilaterally, no wheezing, no crackles.  Cardiovascular: Regular rate and rhythm, no murmurs / rubs / gallops. No LE edema.  Abdomen: no tenderness. Bowel sounds positive.  Musculoskeletal: no clubbing / cyanosis.  Skin: no rashes. Chronic venous stasis changes LE. Neurologic: non focal   Psychiatric: Normal judgment and insight. Alert and oriented x 3. Normal mood.    Data Reviewed: I have personally reviewed following labs and imaging studies  CBC:  Recent Labs Lab 04/02/16 1723  WBC 7.0  NEUTROABS 5.5  HGB 13.9  HCT 42.1  MCV 93.1  PLT [id number]   Basic Metabolic Panel:  Recent Labs Lab 04/02/16 1723  NA 138  K 3.8  CL 103  CO2 24  GLUCOSE 109*  BUN 12  CREATININE 0.87  CALCIUM 9.9   GFR: Estimated Creatinine Clearance: 38.8 mL/min (by C-G formula based on SCr of 0.87 mg/dL). Liver Function  Tests:  Recent Labs Lab 04/02/16 1723  AST 22  ALT 19  ALKPHOS 55  BILITOT 0.7  PROT 7.0  ALBUMIN 4.0    Recent Labs Lab 04/02/16 1723  LIPASE 31   No results for input(s): AMMONIA in the last 168 hours. Coagulation Profile:  Recent Labs Lab 04/02/16 1723  INR 1.03   Cardiac Enzymes: No results for input(s): CKTOTAL, CKMB, CKMBINDEX, TROPONINI in the last 168 hours. BNP (last 3 results)  Recent Labs  11/11/15 1623  PROBNP 68.0   HbA1C: No results for input(s): HGBA1C in the last 72 hours. CBG: No results for input(s): GLUCAP in the last 168 hours. Lipid Profile: No results for input(s): CHOL, HDL, LDLCALC, TRIG, CHOLHDL, LDLDIRECT  in the last 72 hours. Thyroid Function Tests: No results for input(s): TSH, T4TOTAL, FREET4, T3FREE, THYROIDAB in the last 72 hours. Anemia Panel: No results for input(s): VITAMINB12, FOLATE, FERRITIN, TIBC, IRON, RETICCTPCT in the last 72 hours. Urine analysis:    Component Value Date/Time   COLORURINE YELLOW 04/02/2016 [place] 04/02/2016 1617   LABSPEC 1.010 04/02/2016 1617   PHURINE 6.0 04/02/2016 1617   GLUCOSEU NEGATIVE 04/02/2016 1617   HGBUR TRACE (A) 04/02/2016 1617   HGBUR trace 03/04/2008 1529   BILIRUBINUR NEGATIVE 04/02/2016 1617   KETONESUR 15 (A) 04/02/2016 1617   PROTEINUR NEGATIVE 04/02/2016 1617   UROBILINOGEN 0.2 10/23/2010 0505   NITRITE NEGATIVE 04/02/2016 1617   LEUKOCYTESUR NEGATIVE 04/02/2016 1617   Sepsis Labs: Invalid input(s): PROCALCITONIN, LACTICIDVEN  No results found for this or any previous visit (from the past 240 hour(s)).    Radiology Studies: Dg Chest 2 View  Result Date: 04/02/2016 CLINICAL DATA:  Severe back pain.  Left-sided chest pain today. EXAM: CHEST  2 VIEW COMPARISON:  Chest radiograph 11/11/2015, spine MRI 10/15/2015 FINDINGS: The cardiac silhouette is enlarged. Mediastinal contours appear intact. There is no evidence of pneumothorax. There has been interval  development of moderate in size left pleural effusion. Smaller right subpulmonic effusion is also possible. There is mild interstitial prominence bilaterally. If there is a significantly exaggerated thoracic kyphosis centered in the second third of the thoracic spine. The thoracic vertebrae are not well evaluated due to osteopenia and positioning, but new or advanced compression fracture is suspected based on the exaggerated kyphotic deformity. IMPRESSION: Moderate in size left pleural effusion. Possible small right subpulmonic pleural effusion. Mild pulmonary vascular congestion. Significantly exaggerated thoracic kyphosis when compared to patient's prior chest radiograph and MRI,which is suggestive of new or worsening compression fracture of one or more thoracic vertebral bodies.The thoracic vertebrae are not well evaluated due to osteopenia and positioning. Patient has a known T8, T9, and T11 expansile lesions with associated pathologic fractures of T8 and T9 demonstrated by MRI in April 2017. Electronically Signed   By: Fidela Salisbury M.D.   On: 04/02/2016 17:42   Ct Head Wo Contrast  Result Date: 04/02/2016 CLINICAL DATA:  Severe back pain. Compression fracture diagnosed in April. Pain is worsening today. EXAM: CT HEAD WITHOUT CONTRAST CT CERVICAL SPINE WITHOUT CONTRAST TECHNIQUE: Multidetector CT imaging of the head and cervical spine was performed following the standard protocol without intravenous contrast. Multiplanar CT image reconstructions of the cervical spine were also generated. COMPARISON:  CT head 11/30/2006 FINDINGS: CT HEAD FINDINGS Brain: Mild diffuse cerebral atrophy. Ventricular dilatation consistent with central atrophy. Low-attenuation changes in the deep white matter consistent with small vessel ischemia. No evidence of acute infarction, hemorrhage, hydrocephalus, extra-axial collection or mass lesion/mass effect. Vascular: No hyperdense vessel or unexpected calcification. Skull:  Normal. Negative for fracture or focal lesion. Sinuses/Orbits: No acute finding. Degenerative changes in the temporomandibular joints. Other: No significant changes since previous study. CT CERVICAL SPINE FINDINGS Alignment: Slight anterior subluxation of C7 on T1 is likely degenerative. Otherwise normal alignment of the cervical spine. Skull base and vertebrae: No acute fracture. No primary bone lesion or focal pathologic process. Soft tissues and spinal canal: No prevertebral fluid or swelling. No visible canal hematoma. Disc levels: Degenerative changes throughout the cervical spine with narrowed interspaces and endplate hypertrophic changes. Degenerative disc disease at C5-6 and C6-7 levels. Prominent posterior osteophytes at C5-6 and C6-7 cause some impression upon the central canal. C1-2 articulation appears  intact. Congenital nonunion of the posterior arch of C1. Degenerative changes throughout the cervical facet joints with posterior coalition at C4-5. Upper chest: There is a fracture of the posterior right first rib but associated with a mildly expansile lucent lesions suggesting a pathologic fracture. Appearance is suspicious for metastasis. Other: None. IMPRESSION: Diffuse degenerative changes throughout the cervical spine. No acute displaced cervical spine fractures identified. There is a pathologic fracture of the right posterior first rib. Electronically Signed   By: Lucienne Capers M.D.   On: 04/02/2016 20:01   Ct Chest W Contrast  Result Date: 04/02/2016 CLINICAL DATA:  Pt reports to the ED for eval of back pain. Pt had some compression fx diagnosed in April. The pain got worse last night and into today. Pt able to move all extremities. Pt denies any numbness, tingling, paralysis, or bowel or bladder incontinence EXAM: CT CHEST, ABDOMEN, AND PELVIS WITH CONTRAST TECHNIQUE: Multidetector CT imaging of the chest, abdomen and pelvis was performed following the standard protocol during bolus  administration of intravenous contrast. CONTRAST:  100 mL of Isovue-300 intravenous contrast COMPARISON:  Chest CT, 08/22/2014 FINDINGS: CT CHEST FINDINGS Cardiovascular: Heart is normal in size. There are mild coronary artery calcifications. Great vessels normal in caliber. Mild atherosclerotic plaque is noted along the aortic arch and descending thoracic aorta. No significant stenosis. Mediastinum/Nodes: 14 mm nodule lies in the posterior left thyroid lobe, stable. No mediastinal or hilar masses or enlarged lymph nodes. Lungs/Pleura: Small left a minimal right pleural effusions. There is dependent opacity at the lung bases, most notably in the left lower lobe. This is likely atelectasis. Pneumonia is possible. There is no evidence of pulmonary edema. No lung mass or suspicious nodule. No pneumothorax. CT ABDOMEN PELVIS FINDINGS Hepatobiliary: Liver is unremarkable. There is intra and extra hepatic bile duct dilation. Common bile duct measures 12 mm in greatest dimension. No convincing duct stone. Gallbladder surgically absent. Pancreas: No pancreatic mass or inflammation. Mild dilation of the pancreatic duct. Spleen: Normal in size without focal abnormality. Adrenals/Urinary Tract: Mild bilateral renal cortical thinning. No renal masses or stones. No hydronephrosis. Ureters normal in course and in caliber. Bladder is unremarkable. Stomach/Bowel: Stomach and small bowel are unremarkable. Mild increased stool in the colon. No colonic wall thickening or inflammatory change. Appendix not visualized. No evidence of appendicitis. Vascular/Lymphatic: Mild atherosclerotic calcifications noted along a normal caliber abdominal aorta. No enlarged lymph nodes. Reproductive: Status post hysterectomy. No adnexal masses. Other: No abdominal wall hernia or abnormality. No abdominopelvic ascites. MUSCULOSKELETAL FINDINGS There is a destructive mass arising from the T8 and T9 vertebrae extending to involve the lower aspect of the  T7 vertebra and the upper endplate of QA348G as well. Mass measures 4.1 x 5.1 x 4.9 cm in size. It extends posteriorly into the spinal canal to cause severe central spinal stenosis. It also extends into the neural foramina, right greater than left causing significant narrowing. There are no other bone lesions. The bones are diffusely demineralized. IMPRESSION: 1. There is a destructive spinal mass centered on the T8 and T9 vertebrae, but also involving the lower aspect of the T7 vertebra and upper aspect of T10. The mass causes severe central stenosis and significant narrowing of the neural foramina. This mass may reflect a primary bone neoplasm such as a plasmacytoma. A metastatic lesion is possible although a primary neoplasm is not visualized. Followup thoracic spine MRI with and without contrast to assess the extent of cord compression is recommended. 2. No other bone  lesions. 3. Small left and minimal right pleural effusions. There is associated dependent atelectasis most evident in the left lower lobe. 4. No acute abnormalities below the diaphragm. Electronically Signed   By: Lajean Manes M.D.   On: 04/02/2016 20:10   Ct Cervical Spine Wo Contrast  Result Date: 04/02/2016 CLINICAL DATA:  Severe back pain. Compression fracture diagnosed in April. Pain is worsening today. EXAM: CT HEAD WITHOUT CONTRAST CT CERVICAL SPINE WITHOUT CONTRAST TECHNIQUE: Multidetector CT imaging of the head and cervical spine was performed following the standard protocol without intravenous contrast. Multiplanar CT image reconstructions of the cervical spine were also generated. COMPARISON:  CT head 11/30/2006 FINDINGS: CT HEAD FINDINGS Brain: Mild diffuse cerebral atrophy. Ventricular dilatation consistent with central atrophy. Low-attenuation changes in the deep white matter consistent with small vessel ischemia. No evidence of acute infarction, hemorrhage, hydrocephalus, extra-axial collection or mass lesion/mass effect. Vascular:  No hyperdense vessel or unexpected calcification. Skull: Normal. Negative for fracture or focal lesion. Sinuses/Orbits: No acute finding. Degenerative changes in the temporomandibular joints. Other: No significant changes since previous study. CT CERVICAL SPINE FINDINGS Alignment: Slight anterior subluxation of C7 on T1 is likely degenerative. Otherwise normal alignment of the cervical spine. Skull base and vertebrae: No acute fracture. No primary bone lesion or focal pathologic process. Soft tissues and spinal canal: No prevertebral fluid or swelling. No visible canal hematoma. Disc levels: Degenerative changes throughout the cervical spine with narrowed interspaces and endplate hypertrophic changes. Degenerative disc disease at C5-6 and C6-7 levels. Prominent posterior osteophytes at C5-6 and C6-7 cause some impression upon the central canal. C1-2 articulation appears intact. Congenital nonunion of the posterior arch of C1. Degenerative changes throughout the cervical facet joints with posterior coalition at C4-5. Upper chest: There is a fracture of the posterior right first rib but associated with a mildly expansile lucent lesions suggesting a pathologic fracture. Appearance is suspicious for metastasis. Other: None. IMPRESSION: Diffuse degenerative changes throughout the cervical spine. No acute displaced cervical spine fractures identified. There is a pathologic fracture of the right posterior first rib. Electronically Signed   By: Lucienne Capers M.D.   On: 04/02/2016 20:01   Ct Abdomen Pelvis W Contrast  Result Date: 04/02/2016 CLINICAL DATA:  Pt reports to the ED for eval of back pain. Pt had some compression fx diagnosed in April. The pain got worse last night and into today. Pt able to move all extremities. Pt denies any numbness, tingling, paralysis, or bowel or bladder incontinence EXAM: CT CHEST, ABDOMEN, AND PELVIS WITH CONTRAST TECHNIQUE: Multidetector CT imaging of the chest, abdomen and pelvis  was performed following the standard protocol during bolus administration of intravenous contrast. CONTRAST:  100 mL of Isovue-300 intravenous contrast COMPARISON:  Chest CT, 08/22/2014 FINDINGS: CT CHEST FINDINGS Cardiovascular: Heart is normal in size. There are mild coronary artery calcifications. Great vessels normal in caliber. Mild atherosclerotic plaque is noted along the aortic arch and descending thoracic aorta. No significant stenosis. Mediastinum/Nodes: 14 mm nodule lies in the posterior left thyroid lobe, stable. No mediastinal or hilar masses or enlarged lymph nodes. Lungs/Pleura: Small left a minimal right pleural effusions. There is dependent opacity at the lung bases, most notably in the left lower lobe. This is likely atelectasis. Pneumonia is possible. There is no evidence of pulmonary edema. No lung mass or suspicious nodule. No pneumothorax. CT ABDOMEN PELVIS FINDINGS Hepatobiliary: Liver is unremarkable. There is intra and extra hepatic bile duct dilation. Common bile duct measures 12 mm in greatest dimension.  No convincing duct stone. Gallbladder surgically absent. Pancreas: No pancreatic mass or inflammation. Mild dilation of the pancreatic duct. Spleen: Normal in size without focal abnormality. Adrenals/Urinary Tract: Mild bilateral renal cortical thinning. No renal masses or stones. No hydronephrosis. Ureters normal in course and in caliber. Bladder is unremarkable. Stomach/Bowel: Stomach and small bowel are unremarkable. Mild increased stool in the colon. No colonic wall thickening or inflammatory change. Appendix not visualized. No evidence of appendicitis. Vascular/Lymphatic: Mild atherosclerotic calcifications noted along a normal caliber abdominal aorta. No enlarged lymph nodes. Reproductive: Status post hysterectomy. No adnexal masses. Other: No abdominal wall hernia or abnormality. No abdominopelvic ascites. MUSCULOSKELETAL FINDINGS There is a destructive mass arising from the T8 and  T9 vertebrae extending to involve the lower aspect of the T7 vertebra and the upper endplate of QA348G as well. Mass measures 4.1 x 5.1 x 4.9 cm in size. It extends posteriorly into the spinal canal to cause severe central spinal stenosis. It also extends into the neural foramina, right greater than left causing significant narrowing. There are no other bone lesions. The bones are diffusely demineralized. IMPRESSION: 1. There is a destructive spinal mass centered on the T8 and T9 vertebrae, but also involving the lower aspect of the T7 vertebra and upper aspect of T10. The mass causes severe central stenosis and significant narrowing of the neural foramina. This mass may reflect a primary bone neoplasm such as a plasmacytoma. A metastatic lesion is possible although a primary neoplasm is not visualized. Followup thoracic spine MRI with and without contrast to assess the extent of cord compression is recommended. 2. No other bone lesions. 3. Small left and minimal right pleural effusions. There is associated dependent atelectasis most evident in the left lower lobe. 4. No acute abnormalities below the diaphragm. Electronically Signed   By: Lajean Manes M.D.   On: 04/02/2016 20:10     Scheduled Meds: . docusate sodium  100 mg Oral BID  . senna  1 tablet Oral BID   Continuous Infusions:      Time spent: 35 minutes, more than 50% bedside and on the floor involved in patient's care    Marzetta Board, MD, PhD [facility] Pager [phone]  If 7PM-7AM, please contact night-coverage [email or web address] Password [email or web address] 04/03/2016, 8:48 AM

## 2016-04-03 NOTE — Progress Notes (Signed)
Patient's POAs are  Nehemiah Massed, brother, [phone] Dr. Wendelyn Breslow, son, [phone]  Please do not contact [place] [employer] as she should not be involved in patient's care  Costin M. Cruzita Lederer, MD [facility] [phone]

## 2016-04-04 ENCOUNTER — Inpatient Hospital Stay (HOSPITAL_COMMUNITY): Payer: Medicare Other

## 2016-04-04 ENCOUNTER — Encounter (HOSPITAL_COMMUNITY): Payer: Self-pay | Admitting: General Surgery

## 2016-04-04 DIAGNOSIS — G9520 Unspecified cord compression: Secondary | ICD-10-CM

## 2016-04-04 DIAGNOSIS — D492 Neoplasm of unspecified behavior of bone, soft tissue, and skin: Secondary | ICD-10-CM

## 2016-04-04 HISTORY — PX: IR GENERIC HISTORICAL: IMG1180011

## 2016-04-04 LAB — KAPPA/LAMBDA LIGHT CHAINS
Kappa free light chain: 142.5 mg/L — ABNORMAL HIGH (ref 3.3–19.4)
Kappa, lambda light chain ratio: 12.18 — ABNORMAL HIGH (ref 0.26–1.65)
Lambda free light chains: 11.7 mg/L (ref 5.7–26.3)

## 2016-04-04 MED ORDER — DEXAMETHASONE SODIUM PHOSPHATE 4 MG/ML IJ SOLN
4.0000 mg | INTRAMUSCULAR | Status: DC
  Administered 2016-04-05 – 2016-04-12 (×8): 4 mg via INTRAVENOUS
  Filled 2016-04-04 (×8): qty 1

## 2016-04-04 MED ORDER — FENTANYL CITRATE (PF) 100 MCG/2ML IJ SOLN
INTRAMUSCULAR | Status: AC
  Filled 2016-04-04: qty 2

## 2016-04-04 MED ORDER — FENTANYL CITRATE (PF) 100 MCG/2ML IJ SOLN
INTRAMUSCULAR | Status: AC | PRN
  Administered 2016-04-04: 25 ug via INTRAVENOUS

## 2016-04-04 MED ORDER — SODIUM CHLORIDE 0.9 % IV SOLN: INTRAVENOUS | Status: AC

## 2016-04-04 MED ORDER — BUPIVACAINE HCL (PF) 0.25 % IJ SOLN
INTRAMUSCULAR | Status: AC
  Filled 2016-04-04: qty 30

## 2016-04-04 MED ORDER — POLYETHYLENE GLYCOL 3350 17 G PO PACK
17.0000 g | PACK | Freq: Every day | ORAL | Status: DC
  Administered 2016-04-04 – 2016-04-12 (×7): 17 g via ORAL
  Filled 2016-04-04 (×7): qty 1

## 2016-04-04 MED ORDER — MIDAZOLAM HCL 2 MG/2ML IJ SOLN
INTRAMUSCULAR | Status: AC | PRN
  Administered 2016-04-04: 1 mg via INTRAVENOUS

## 2016-04-04 MED ORDER — MIDAZOLAM HCL 2 MG/2ML IJ SOLN
INTRAMUSCULAR | Status: AC
  Filled 2016-04-04: qty 2

## 2016-04-04 MED ORDER — CYCLOSPORINE 0.05 % OP EMUL
1.0000 [drp] | Freq: Two times a day (BID) | OPHTHALMIC | Status: DC
  Administered 2016-04-04 – 2016-04-07 (×7): 1 [drp] via OPHTHALMIC
  Filled 2016-04-04 (×11): qty 1

## 2016-04-04 NOTE — Progress Notes (Signed)
Initial Nutrition Assessment  INTERVENTION:   Supplement diet once advanced.    NUTRITION DIAGNOSIS:   Inadequate oral intake related to inability to eat as evidenced by NPO status.  GOAL:   Patient will meet greater than or equal to 90% of their needs  MONITOR:   Diet advancement, I & O's, Weight trends  REASON FOR ASSESSMENT:   Malnutrition Screening Tool    ASSESSMENT:   Pt with hx of schizophrenia and HTN who presents with back pain x 4 months. MRI of spine showed lesions and pathological fractures. She has refused oncology consult PTA.    Medications reviewed and include: decadron, colace, oxycodone, senokot Labs reviewed  Unable to complete Nutrition-Focused physical exam at this time.  Attempted to see patient twice however pt with staff both times.  Per chart review pt may have had a 20 lb weight loss.   Diet Order:  Diet regular Room service appropriate? Yes; Fluid consistency: Thin  Skin:  Reviewed, no issues  Last BM:  10/6  Height:   Ht Readings from Last 1 Encounters:  04/02/16 5' (1.524 m)    Weight:   Wt Readings from Last 1 Encounters:  04/02/16 141 lb (64 kg)    Ideal Body Weight:  45.4 kg  BMI:  Body mass index is 27.54 kg/m.  Estimated Nutritional Needs:   Kcal:  1400-1600  Protein:  70-80 grams  Fluid:  > 1.5 L/day  EDUCATION NEEDS:   No education needs identified at this time  [place], [place], [place] Pager [phone] After Hours Pager

## 2016-04-04 NOTE — Procedures (Signed)
S/P T 10 fluoro guided core biopsies  X 3 .

## 2016-04-04 NOTE — Consult Note (Signed)
Radiation Oncology         [phone] ________________________________  Initial inpatient Consultation  Name: Mikayla Marks MRN: [id number]  Date:  04/04/16 DOB: 05/08/1928//  [id number] Mikayla Kells, MD  No ref. provider found   REFERRING PHYSICIAN: No ref. provider found  DIAGNOSIS: The primary encounter diagnosis was Acute thoracic back pain, unspecified back pain laterality. Diagnoses of Neoplasm of thoracic vertebral column, Thoracic spine tumor, and Mass of thoracic vertebra were also pertinent to this visit.    ICD-9-CM ICD-10-CM   1. Acute thoracic back pain, unspecified back pain laterality 724.1 M54.6   2. Neoplasm of thoracic vertebral column 239.2 D49.2   3. Thoracic spine tumor 239.2 D49.2 CANCELED: CT BIOPSY     CANCELED: CT BIOPSY  4. Mass of thoracic vertebra 733.90 R22.2 IR Fluoro Guide Ndl Plmt / BX     IR Fluoro Guide Ndl Plmt / BX    HISTORY OF PRESENT ILLNESS: Mikayla Marks is a 82 y.o. female seen at the request of Dr. Burr Medico and Dr. 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 The patient has untreated paranoid schizophrenia and has been noncompliant with follow up. Apparently she has been having back pain for several months, and had an MRI of the thoracic spine in April which revealed significant kyphosis and a a3.1 x 3.5  Cm T8 mass, and on transverse imaging this measured 3.8 x 2.9 cm at T8-T9. Both vertebral bodies demonstrated pathologic fractures with mild osseous retropulsion and small lesions were also seen in the T11 pedicles. She continued to have back pain and described ridiculopathy around her ribs bilaterally. She was encouraged to meet with oncology, but refused on several occasions. Her symptoms progressed and she ultimately sought care at [facility] ED, and on 04/02/16 was admitted. A CT c/a/p and of the head and C-spine revealed diffuse degenerative changes of the cervical spine, a destructive spinal mass at T8-T9 involving the lower aspect of  T7 and T10 causing severe central stenosis and narrowing of the neural foramina. She had small left and minimal right pleural effusion as well. She has undergone CT biopsy of this spinal mass today with Dr. Marjory Lies, and has been started on dexamethasone 4 mg BID for pain relief.       PREVIOUS RADIATION THERAPY: No  PAST MEDICAL HISTORY:  Past Medical History:  Diagnosis Date  . Hyperlipidemia   . HYPERTENSION   . Osteoporosis   . SCC (squamous cell carcinoma)    in situ R pretibial area  . Schizoaffective disorder ([place])       PAST SURGICAL HISTORY: Past Surgical History:  Procedure Laterality Date  . BREAST BIOPSY     benign tumor  . CATARACT EXTRACTION    . CHOLECYSTECTOMY    . LASER ABLATION Left    left LE   . LEG SURGERY Right 10/2012   Ruptured Veins  . PARTIAL HYSTERECTOMY     due to bleeding  . TONSILLECTOMY      FAMILY HISTORY:  Family History  Problem Relation Age of Onset  . Heart Problems Mother   . Colon cancer Neg Hx   . Breast cancer Neg Hx   . CAD Neg Hx     SOCIAL HISTORY:  Social History   Social History  . Marital status: Single    Spouse name: N/A  . Number of children: 3  . Years of education: N/A   Occupational History  . not working    Social History Main  Topics  . Smoking status: Never Smoker  . Smokeless tobacco: Never Used  . Alcohol use No  . Drug use: No  . Sexual activity: No   Other Topics Concern  . Not on file   Social History Narrative   Daughter lives with her [name])     still drives   widow   Brother [name] in [place], lives 5 min away [phone]          ALLERGIES: Other; Bacitracin-polymyxin b; and Penicillins  MEDICATIONS:  Current Facility-Administered Medications  Medication Dose Route Frequency Provider Last Rate Last Dose  . 0.9 %  sodium chloride infusion   Intravenous Continuous Luanne Bras, MD      . acetaminophen (TYLENOL) tablet 650 mg  650 mg Oral Q6H PRN Edwin Dada, MD        Or  . acetaminophen (TYLENOL) suppository 650 mg  650 mg Rectal Q6H PRN Edwin Dada, MD      . bupivacaine (PF) (MARCAINE) 0.25 % injection           . cycloSPORINE (RESTASIS) 0.05 % ophthalmic emulsion 1 drop  1 drop Both Eyes BID Caren Griffins, MD   1 drop at 04/04/16 0918  . dexamethasone (DECADRON) injection 4 mg  4 mg Intravenous Q12H Dory Horn, NP   4 mg at 04/04/16 0803  . docusate sodium (COLACE) capsule 100 mg  100 mg Oral BID Edwin Dada, MD   100 mg at 04/04/16 0802  . fentaNYL (SUBLIMAZE) 100 MCG/2ML injection           . methocarbamol (ROBAXIN) tablet 500 mg  500 mg Oral Q8H PRN Caren Griffins, MD   500 mg at 04/04/16 0802  . midazolam (VERSED) 2 MG/2ML injection           . morphine 4 MG/ML injection 4 mg  4 mg Intravenous Q3H PRN Charlesetta Shanks, MD   4 mg at 04/03/16 2140  . oxyCODONE (Oxy IR/ROXICODONE) immediate release tablet 5 mg  5 mg Oral Q4H PRN Edwin Dada, MD   5 mg at 04/03/16 0700  . oxyCODONE (Oxy IR/ROXICODONE) immediate release tablet 5 mg  5 mg Oral Q6H Dory Horn, NP   5 mg at 04/04/16 [id number]  . polyethylene glycol (MIRALAX / GLYCOLAX) packet 17 g  17 g Oral Daily Carly J Rivet, MD      . senna (SENOKOT) tablet 8.6 mg  1 tablet Oral BID Edwin Dada, MD   8.6 mg at 04/04/16 0802    REVIEW OF SYSTEMS:  On review of systems, the patient reports that she is doing well overall now that she has received pain relief with medication. Her radiculopathy has improved also. She denies any chest pain, shortness of breath, cough, fevers, chills, night sweats, and per the record has lost about 15 pounds unintentionally. She denies any bowel or bladder disturbances, and denies abdominal pain, nausea or vomiting.  A complete review of systems is obtained and is otherwise negative.    PHYSICAL EXAM:  height is 5' (1.524 m) and weight is 141 lb (64 kg). Her oral temperature is 97.5 F (36.4 C). Her blood pressure is  130/73 and her pulse is 93. Her respiration is 15 and oxygen saturation is 100%.   Pain Scale 0/10 In general this is a well appearing caucasian female in no acute distress. She's alert and oriented x4 and appropriate throughout the examination. Cardiopulmonary assessment is negative for acute  distress and she exhibits normal effort.     KPS = 40  100 - Normal; no complaints; no evidence of disease. 90   - Able to carry on normal activity; minor signs or symptoms of disease. 80   - Normal activity with effort; some signs or symptoms of disease. 70   - Cares for self; unable to carry on normal activity or to do active work. 60   - Requires occasional assistance, but is able to care for most of his personal needs. 50   - Requires considerable assistance and frequent medical care. 7   - Disabled; requires special care and assistance. 22   - Severely disabled; hospital admission is indicated although death not imminent. 71   - Very sick; hospital admission necessary; active supportive treatment necessary. 10   - Moribund; fatal processes progressing rapidly. 0     - Dead  Karnofsky DA, Abelmann Monon, Craver LS and [place] [facility] [phone]) The use of the nitrogen mustards in the palliative treatment of carcinoma: with particular reference to bronchogenic carcinoma Cancer 1 634-56  LABORATORY DATA:  Lab Results  Component Value Date   WBC 7.0 04/02/2016   HGB 13.9 04/02/2016   HCT 42.1 04/02/2016   MCV 93.1 04/02/2016   PLT 317 04/02/2016   Lab Results  Component Value Date   NA 138 04/02/2016   K 3.8 04/02/2016   CL 103 04/02/2016   CO2 24 04/02/2016   Lab Results  Component Value Date   ALT 19 04/02/2016   AST 22 04/02/2016   ALKPHOS 55 04/02/2016   BILITOT 0.7 04/02/2016     RADIOGRAPHY: Dg Chest 2 View  Result Date: 04/02/2016 CLINICAL DATA:  Severe back pain.  Left-sided chest pain today. EXAM: CHEST  2 VIEW COMPARISON:  Chest radiograph 11/11/2015, spine MRI 10/15/2015  FINDINGS: The cardiac silhouette is enlarged. Mediastinal contours appear intact. There is no evidence of pneumothorax. There has been interval development of moderate in size left pleural effusion. Smaller right subpulmonic effusion is also possible. There is mild interstitial prominence bilaterally. If there is a significantly exaggerated thoracic kyphosis centered in the second third of the thoracic spine. The thoracic vertebrae are not well evaluated due to osteopenia and positioning, but new or advanced compression fracture is suspected based on the exaggerated kyphotic deformity. IMPRESSION: Moderate in size left pleural effusion. Possible small right subpulmonic pleural effusion. Mild pulmonary vascular congestion. Significantly exaggerated thoracic kyphosis when compared to patient's prior chest radiograph and MRI,which is suggestive of new or worsening compression fracture of one or more thoracic vertebral bodies.The thoracic vertebrae are not well evaluated due to osteopenia and positioning. Patient has a known T8, T9, and T11 expansile lesions with associated pathologic fractures of T8 and T9 demonstrated by MRI in April 2017. Electronically Signed   By: Fidela Salisbury M.D.   On: 04/02/2016 17:42   Ct Head Wo Contrast  Result Date: 04/02/2016 CLINICAL DATA:  Severe back pain. Compression fracture diagnosed in April. Pain is worsening today. EXAM: CT HEAD WITHOUT CONTRAST CT CERVICAL SPINE WITHOUT CONTRAST TECHNIQUE: Multidetector CT imaging of the head and cervical spine was performed following the standard protocol without intravenous contrast. Multiplanar CT image reconstructions of the cervical spine were also generated. COMPARISON:  CT head 11/30/2006 FINDINGS: CT HEAD FINDINGS Brain: Mild diffuse cerebral atrophy. Ventricular dilatation consistent with central atrophy. Low-attenuation changes in the deep white matter consistent with small vessel ischemia. No evidence of acute infarction,  hemorrhage, hydrocephalus, extra-axial collection or  mass lesion/mass effect. Vascular: No hyperdense vessel or unexpected calcification. Skull: Normal. Negative for fracture or focal lesion. Sinuses/Orbits: No acute finding. Degenerative changes in the temporomandibular joints. Other: No significant changes since previous study. CT CERVICAL SPINE FINDINGS Alignment: Slight anterior subluxation of C7 on T1 is likely degenerative. Otherwise normal alignment of the cervical spine. Skull base and vertebrae: No acute fracture. No primary bone lesion or focal pathologic process. Soft tissues and spinal canal: No prevertebral fluid or swelling. No visible canal hematoma. Disc levels: Degenerative changes throughout the cervical spine with narrowed interspaces and endplate hypertrophic changes. Degenerative disc disease at C5-6 and C6-7 levels. Prominent posterior osteophytes at C5-6 and C6-7 cause some impression upon the central canal. C1-2 articulation appears intact. Congenital nonunion of the posterior arch of C1. Degenerative changes throughout the cervical facet joints with posterior coalition at C4-5. Upper chest: There is a fracture of the posterior right first rib but associated with a mildly expansile lucent lesions suggesting a pathologic fracture. Appearance is suspicious for metastasis. Other: None. IMPRESSION: Diffuse degenerative changes throughout the cervical spine. No acute displaced cervical spine fractures identified. There is a pathologic fracture of the right posterior first rib. Electronically Signed   By: Lucienne Capers M.D.   On: 04/02/2016 20:01   Ct Chest W Contrast  Result Date: 04/02/2016 CLINICAL DATA:  Pt reports to the ED for eval of back pain. Pt had some compression fx diagnosed in April. The pain got worse last night and into today. Pt able to move all extremities. Pt denies any numbness, tingling, paralysis, or bowel or bladder incontinence EXAM: CT CHEST, ABDOMEN, AND PELVIS WITH  CONTRAST TECHNIQUE: Multidetector CT imaging of the chest, abdomen and pelvis was performed following the standard protocol during bolus administration of intravenous contrast. CONTRAST:  100 mL of Isovue-300 intravenous contrast COMPARISON:  Chest CT, 08/22/2014 FINDINGS: CT CHEST FINDINGS Cardiovascular: Heart is normal in size. There are mild coronary artery calcifications. Great vessels normal in caliber. Mild atherosclerotic plaque is noted along the aortic arch and descending thoracic aorta. No significant stenosis. Mediastinum/Nodes: 14 mm nodule lies in the posterior left thyroid lobe, stable. No mediastinal or hilar masses or enlarged lymph nodes. Lungs/Pleura: Small left a minimal right pleural effusions. There is dependent opacity at the lung bases, most notably in the left lower lobe. This is likely atelectasis. Pneumonia is possible. There is no evidence of pulmonary edema. No lung mass or suspicious nodule. No pneumothorax. CT ABDOMEN PELVIS FINDINGS Hepatobiliary: Liver is unremarkable. There is intra and extra hepatic bile duct dilation. Common bile duct measures 12 mm in greatest dimension. No convincing duct stone. Gallbladder surgically absent. Pancreas: No pancreatic mass or inflammation. Mild dilation of the pancreatic duct. Spleen: Normal in size without focal abnormality. Adrenals/Urinary Tract: Mild bilateral renal cortical thinning. No renal masses or stones. No hydronephrosis. Ureters normal in course and in caliber. Bladder is unremarkable. Stomach/Bowel: Stomach and small bowel are unremarkable. Mild increased stool in the colon. No colonic wall thickening or inflammatory change. Appendix not visualized. No evidence of appendicitis. Vascular/Lymphatic: Mild atherosclerotic calcifications noted along a normal caliber abdominal aorta. No enlarged lymph nodes. Reproductive: Status post hysterectomy. No adnexal masses. Other: No abdominal wall hernia or abnormality. No abdominopelvic ascites.  MUSCULOSKELETAL FINDINGS There is a destructive mass arising from the T8 and T9 vertebrae extending to involve the lower aspect of the T7 vertebra and the upper endplate of QA348G as well. Mass measures 4.1 x 5.1 x 4.9 cm in size.  It extends posteriorly into the spinal canal to cause severe central spinal stenosis. It also extends into the neural foramina, right greater than left causing significant narrowing. There are no other bone lesions. The bones are diffusely demineralized. IMPRESSION: 1. There is a destructive spinal mass centered on the T8 and T9 vertebrae, but also involving the lower aspect of the T7 vertebra and upper aspect of T10. The mass causes severe central stenosis and significant narrowing of the neural foramina. This mass may reflect a primary bone neoplasm such as a plasmacytoma. A metastatic lesion is possible although a primary neoplasm is not visualized. Followup thoracic spine MRI with and without contrast to assess the extent of cord compression is recommended. 2. No other bone lesions. 3. Small left and minimal right pleural effusions. There is associated dependent atelectasis most evident in the left lower lobe. 4. No acute abnormalities below the diaphragm. Electronically Signed   By: Lajean Manes M.D.   On: 04/02/2016 20:10   Ct Cervical Spine Wo Contrast  Result Date: 04/02/2016 CLINICAL DATA:  Severe back pain. Compression fracture diagnosed in April. Pain is worsening today. EXAM: CT HEAD WITHOUT CONTRAST CT CERVICAL SPINE WITHOUT CONTRAST TECHNIQUE: Multidetector CT imaging of the head and cervical spine was performed following the standard protocol without intravenous contrast. Multiplanar CT image reconstructions of the cervical spine were also generated. COMPARISON:  CT head 11/30/2006 FINDINGS: CT HEAD FINDINGS Brain: Mild diffuse cerebral atrophy. Ventricular dilatation consistent with central atrophy. Low-attenuation changes in the deep white matter consistent with small  vessel ischemia. No evidence of acute infarction, hemorrhage, hydrocephalus, extra-axial collection or mass lesion/mass effect. Vascular: No hyperdense vessel or unexpected calcification. Skull: Normal. Negative for fracture or focal lesion. Sinuses/Orbits: No acute finding. Degenerative changes in the temporomandibular joints. Other: No significant changes since previous study. CT CERVICAL SPINE FINDINGS Alignment: Slight anterior subluxation of C7 on T1 is likely degenerative. Otherwise normal alignment of the cervical spine. Skull base and vertebrae: No acute fracture. No primary bone lesion or focal pathologic process. Soft tissues and spinal canal: No prevertebral fluid or swelling. No visible canal hematoma. Disc levels: Degenerative changes throughout the cervical spine with narrowed interspaces and endplate hypertrophic changes. Degenerative disc disease at C5-6 and C6-7 levels. Prominent posterior osteophytes at C5-6 and C6-7 cause some impression upon the central canal. C1-2 articulation appears intact. Congenital nonunion of the posterior arch of C1. Degenerative changes throughout the cervical facet joints with posterior coalition at C4-5. Upper chest: There is a fracture of the posterior right first rib but associated with a mildly expansile lucent lesions suggesting a pathologic fracture. Appearance is suspicious for metastasis. Other: None. IMPRESSION: Diffuse degenerative changes throughout the cervical spine. No acute displaced cervical spine fractures identified. There is a pathologic fracture of the right posterior first rib. Electronically Signed   By: Lucienne Capers M.D.   On: 04/02/2016 20:01   Ct Abdomen Pelvis W Contrast  Result Date: 04/02/2016 CLINICAL DATA:  Pt reports to the ED for eval of back pain. Pt had some compression fx diagnosed in April. The pain got worse last night and into today. Pt able to move all extremities. Pt denies any numbness, tingling, paralysis, or bowel or  bladder incontinence EXAM: CT CHEST, ABDOMEN, AND PELVIS WITH CONTRAST TECHNIQUE: Multidetector CT imaging of the chest, abdomen and pelvis was performed following the standard protocol during bolus administration of intravenous contrast. CONTRAST:  100 mL of Isovue-300 intravenous contrast COMPARISON:  Chest CT, 08/22/2014 FINDINGS: CT CHEST  FINDINGS Cardiovascular: Heart is normal in size. There are mild coronary artery calcifications. Great vessels normal in caliber. Mild atherosclerotic plaque is noted along the aortic arch and descending thoracic aorta. No significant stenosis. Mediastinum/Nodes: 14 mm nodule lies in the posterior left thyroid lobe, stable. No mediastinal or hilar masses or enlarged lymph nodes. Lungs/Pleura: Small left a minimal right pleural effusions. There is dependent opacity at the lung bases, most notably in the left lower lobe. This is likely atelectasis. Pneumonia is possible. There is no evidence of pulmonary edema. No lung mass or suspicious nodule. No pneumothorax. CT ABDOMEN PELVIS FINDINGS Hepatobiliary: Liver is unremarkable. There is intra and extra hepatic bile duct dilation. Common bile duct measures 12 mm in greatest dimension. No convincing duct stone. Gallbladder surgically absent. Pancreas: No pancreatic mass or inflammation. Mild dilation of the pancreatic duct. Spleen: Normal in size without focal abnormality. Adrenals/Urinary Tract: Mild bilateral renal cortical thinning. No renal masses or stones. No hydronephrosis. Ureters normal in course and in caliber. Bladder is unremarkable. Stomach/Bowel: Stomach and small bowel are unremarkable. Mild increased stool in the colon. No colonic wall thickening or inflammatory change. Appendix not visualized. No evidence of appendicitis. Vascular/Lymphatic: Mild atherosclerotic calcifications noted along a normal caliber abdominal aorta. No enlarged lymph nodes. Reproductive: Status post hysterectomy. No adnexal masses. Other: No  abdominal wall hernia or abnormality. No abdominopelvic ascites. MUSCULOSKELETAL FINDINGS There is a destructive mass arising from the T8 and T9 vertebrae extending to involve the lower aspect of the T7 vertebra and the upper endplate of QA348G as well. Mass measures 4.1 x 5.1 x 4.9 cm in size. It extends posteriorly into the spinal canal to cause severe central spinal stenosis. It also extends into the neural foramina, right greater than left causing significant narrowing. There are no other bone lesions. The bones are diffusely demineralized. IMPRESSION: 1. There is a destructive spinal mass centered on the T8 and T9 vertebrae, but also involving the lower aspect of the T7 vertebra and upper aspect of T10. The mass causes severe central stenosis and significant narrowing of the neural foramina. This mass may reflect a primary bone neoplasm such as a plasmacytoma. A metastatic lesion is possible although a primary neoplasm is not visualized. Followup thoracic spine MRI with and without contrast to assess the extent of cord compression is recommended. 2. No other bone lesions. 3. Small left and minimal right pleural effusions. There is associated dependent atelectasis most evident in the left lower lobe. 4. No acute abnormalities below the diaphragm. Electronically Signed   By: Lajean Manes M.D.   On: 04/02/2016 20:10      IMPRESSION/PLAN: 1. Destructive mass of the thoracic spine concerning for metastatic cancer. The patient has undergone biopsy of this with IR today. We will follow up with the results of her pathology and would recommend palliative radiotherapy to the thoracic spine. I have contacted her brother and HCPOA and he is in agreement. We will go ahead and move forward with the planning process of radiotherapy on Wednesday. Dr. Tammi Klippel is considering hypofractioning her treatment schedule but will await final pathology to confirm the number of treatments. The patient's brother will attend the  simulation for consent, and we will again detail the risks, benefits, short, and long term effects of treatment when we meet face to face. 2. Schizophrenia. After discussing her care with her attending, she appears to function well at home despite her disease. It's felt that her noncompliance, and age are reasons to avoid antipsycholic  medication. We will contact her brother to discuss treatment options and for consent.  3. Goals of care. The patient is interested in palliative approach per Dr. Cruzita Lederer, we will continue with this in mind and appreciate palliative's input.    Carola Rhine, PAC

## 2016-04-04 NOTE — Progress Notes (Signed)
Daughter with pt when returning from IR procedure. Daughter verbally aggressive with me demanding medical information. I instructed her that I was not allowed to share information, that she could talk with her uncle and he would answer her questions. She became more aggressive. I backed out of the room and stated that I would have the director come speak to her. This behavior of daughter was upsetting to the pt.Mikayla Marks

## 2016-04-04 NOTE — Consult Note (Signed)
Chief Complaint: thoracic spine mass involving t7-10  Referring Physician:Dr. Marzetta Board, Dr. Truitt Merle  Supervising Physician: Luanne Bras  Patient Status: In-pt   HPI: Mikayla Marks is an 82 y.o. female who has had back pain intermittently for over the last year.  She apparently had imaging in April of this year that diagnosed this mass.  She was sent to see an orthopedic surgeon for evaluation.  Apparently, according to the chart, she is not a surgical candidate.  She has been treated by her PCP with Tylenol 3.  Over the last several days, this has not helped and she has been crying out in more pain.  She denies leg weakness, numbness, or incontinence.  She presented to the ED for worsening of her symptoms.  She had an CT scan which reveals:  There is a destructive spinal mass centered on the T8 and T9 vertebrae, but also involving the lower aspect of the T7 vertebra and upper aspect of T10. The mass causes severe central stenosis and significant narrowing of the neural foramina. This mass may reflect a primary bone neoplasm such as a plasmacytoma. A metastatic lesion is possible although a primary neoplasm is not visualized. Followup thoracic spine MRI with and without contrast to assess the extent of cord compression is recommended.  Palliative care medicine has seen the patient and discussed this with her family.  They would like to pursue biopsy of this lesions.  We have been consulted for this procedure.  Past Medical History:  Past Medical History:  Diagnosis Date  . Hyperlipidemia   . HYPERTENSION   . Osteoporosis   . SCC (squamous cell carcinoma)    in situ R pretibial area  . Schizoaffective disorder [facility])     Past Surgical History:  Past Surgical History:  Procedure Laterality Date  . BREAST BIOPSY     benign tumor  . CATARACT EXTRACTION    . CHOLECYSTECTOMY    . LASER ABLATION Left    left LE   . LEG SURGERY Right 10/2012   Ruptured Veins  .  PARTIAL HYSTERECTOMY     due to bleeding  . TONSILLECTOMY      Family History:  Family History  Problem Relation Age of Onset  . Heart Problems Mother   . Colon cancer Neg Hx   . Breast cancer Neg Hx   . CAD Neg Hx     Social History:  reports that she has never smoked. She has never used smokeless tobacco. She reports that she does not drink alcohol or use drugs.  Allergies:  Allergies  Allergen Reactions  . Other Other (See Comments)    5FU cream - stroke like symptoms  . Bacitracin-Polymyxin B Other (See Comments)    Reaction unknown.   Marland Kitchen Penicillins Rash    Medications: Medications reviewed in Epic  Please HPI for pertinent positives, otherwise complete 10 system ROS negative.  Mallampati Score: MD Evaluation Airway: WNL Heart: WNL Abdomen: WNL Chest/ Lungs: WNL ASA  Classification: 3 Mallampati/Airway Score: Three  Physical Exam: BP 130/60 (BP Location: Left Arm)   Pulse 76   Temp 97.5 F (36.4 C) (Oral)   Resp 18   Ht 5' (1.524 m)   Wt 141 lb (64 kg)   SpO2 96%   BMI 27.54 kg/m  Body mass index is 27.54 kg/m. General: pleasant, elderly white female who is laying in bed in NAD HEENT: head is normocephalic, atraumatic.  Sclera are noninjected.  PERRL.  Ears and nose without any masses or lesions.  Mouth is pink and moist Heart: regular, rate, and rhythm.  Normal s1,s2. No obvious murmurs, gallops, or rubs noted.  Palpable radial and pedal pulses bilaterally Lungs: CTAB, no wheezes, rhonchi, or rales noted.  Respiratory effort nonlabored Abd: soft, NT, ND, +BS, no masses, hernias, or organomegaly Psych: A&Ox3 with an appropriate affect.   Labs: Results for orders placed or performed during the hospital encounter of 04/02/16 (from the past 48 hour(s))  Urinalysis, Routine w reflex microscopic (not at [facility])     Status: Abnormal   Collection Time: 04/02/16  4:17 PM  Result Value Ref Range   Color, Urine YELLOW YELLOW   APPearance CLEAR CLEAR    Specific Gravity, Urine 1.010 1.005 - 1.030   pH 6.0 5.0 - 8.0   Glucose, UA NEGATIVE NEGATIVE mg/dL   Hgb urine dipstick TRACE (A) NEGATIVE   Bilirubin Urine NEGATIVE NEGATIVE   Ketones, ur 15 (A) NEGATIVE mg/dL   Protein, ur NEGATIVE NEGATIVE mg/dL   Nitrite NEGATIVE NEGATIVE   Leukocytes, UA NEGATIVE NEGATIVE  Urine microscopic-add on     Status: Abnormal   Collection Time: 04/02/16  4:17 PM  Result Value Ref Range   Squamous Epithelial / LPF 0-5 (A) NONE SEEN   WBC, UA 0-5 0 - 5 WBC/hpf   RBC / HPF 0-5 0 - 5 RBC/hpf   Bacteria, UA RARE (A) NONE SEEN  Urine culture     Status: None   Collection Time: 04/02/16  4:18 PM  Result Value Ref Range   Specimen Description URINE, CLEAN CATCH    Special Requests NONE    Culture NO GROWTH    Report Status 04/03/2016 FINAL   CBC with Differential     Status: None   Collection Time: 04/02/16  5:23 PM  Result Value Ref Range   WBC 7.0 4.0 - 10.5 K/uL   RBC 4.52 3.87 - 5.11 MIL/uL   Hemoglobin 13.9 12.0 - 15.0 g/dL   HCT 42.1 36.0 - 46.0 %   MCV 93.1 78.0 - 100.0 fL   MCH 30.8 26.0 - 34.0 pg   MCHC 33.0 30.0 - 36.0 g/dL   RDW 13.9 11.5 - 15.5 %   Platelets 317 150 - 400 K/uL   Neutrophils Relative % 78 %   Neutro Abs 5.5 1.7 - 7.7 K/uL   Lymphocytes Relative 14 %   Lymphs Abs 1.0 0.7 - 4.0 K/uL   Monocytes Relative 7 %   Monocytes Absolute 0.5 0.1 - 1.0 K/uL   Eosinophils Relative 1 %   Eosinophils Absolute 0.1 0.0 - 0.7 K/uL   Basophils Relative 0 %   Basophils Absolute 0.0 0.0 - 0.1 K/uL  Comprehensive metabolic panel     Status: Abnormal   Collection Time: 04/02/16  5:23 PM  Result Value Ref Range   Sodium 138 135 - 145 mmol/L   Potassium 3.8 3.5 - 5.1 mmol/L   Chloride 103 101 - 111 mmol/L   CO2 24 22 - 32 mmol/L   Glucose, Bld 109 (H) 65 - 99 mg/dL   BUN 12 6 - 20 mg/dL   Creatinine, Ser 0.87 0.44 - 1.00 mg/dL   Calcium 9.9 8.9 - 10.3 mg/dL   Total Protein 7.0 6.5 - 8.1 g/dL   Albumin 4.0 3.5 - 5.0 g/dL   AST 22 15  - 41 U/L   ALT 19 14 - 54 U/L   Alkaline Phosphatase 55 38 - 126 U/L  Total Bilirubin 0.7 0.3 - 1.2 mg/dL   GFR calc non Af Amer 59 (L) >60 mL/min   GFR calc Af Amer >60 >60 mL/min    Comment: (NOTE) The eGFR has been calculated using the CKD EPI equation. This calculation has not been validated in all clinical situations. eGFR's persistently <60 mL/min signify possible Chronic Kidney Disease.    Anion gap 11 5 - 15  Lipase, blood     Status: None   Collection Time: 04/02/16  5:23 PM  Result Value Ref Range   Lipase 31 11 - 51 U/L  Brain natriuretic peptide     Status: None   Collection Time: 04/02/16  5:23 PM  Result Value Ref Range   B Natriuretic Peptide 72.5 0.0 - 100.0 pg/mL  Protime-INR     Status: None   Collection Time: 04/02/16  5:23 PM  Result Value Ref Range   Prothrombin Time 13.5 11.4 - 15.2 seconds   INR 1.03   I-Stat Troponin, ED - 0, 3, 6 hours (not at [facility])     Status: None   Collection Time: 04/02/16  5:30 PM  Result Value Ref Range   Troponin i, poc 0.00 0.00 - 0.08 ng/mL   Comment 3            Comment: Due to the release kinetics of cTnI, a negative result within the first hours of the onset of symptoms does not rule out myocardial infarction with certainty. If myocardial infarction is still suspected, repeat the test at appropriate intervals.   I-Stat CG4 Lactic Acid, ED     Status: None   Collection Time: 04/02/16  5:33 PM  Result Value Ref Range   Lactic Acid, Venous 1.28 0.5 - 1.9 mmol/L    Imaging: Dg Chest 2 View  Result Date: 04/02/2016 CLINICAL DATA:  Severe back pain.  Left-sided chest pain today. EXAM: CHEST  2 VIEW COMPARISON:  Chest radiograph 11/11/2015, spine MRI 10/15/2015 FINDINGS: The cardiac silhouette is enlarged. Mediastinal contours appear intact. There is no evidence of pneumothorax. There has been interval development of moderate in size left pleural effusion. Smaller right subpulmonic effusion is also possible. There is mild  interstitial prominence bilaterally. If there is a significantly exaggerated thoracic kyphosis centered in the second third of the thoracic spine. The thoracic vertebrae are not well evaluated due to osteopenia and positioning, but new or advanced compression fracture is suspected based on the exaggerated kyphotic deformity. IMPRESSION: Moderate in size left pleural effusion. Possible small right subpulmonic pleural effusion. Mild pulmonary vascular congestion. Significantly exaggerated thoracic kyphosis when compared to patient's prior chest radiograph and MRI,which is suggestive of new or worsening compression fracture of one or more thoracic vertebral bodies.The thoracic vertebrae are not well evaluated due to osteopenia and positioning. Patient has a known T8, T9, and T11 expansile lesions with associated pathologic fractures of T8 and T9 demonstrated by MRI in April 2017. Electronically Signed   By: Fidela Salisbury M.D.   On: 04/02/2016 17:42   Ct Head Wo Contrast  Result Date: 04/02/2016 CLINICAL DATA:  Severe back pain. Compression fracture diagnosed in April. Pain is worsening today. EXAM: CT HEAD WITHOUT CONTRAST CT CERVICAL SPINE WITHOUT CONTRAST TECHNIQUE: Multidetector CT imaging of the head and cervical spine was performed following the standard protocol without intravenous contrast. Multiplanar CT image reconstructions of the cervical spine were also generated. COMPARISON:  CT head 11/30/2006 FINDINGS: CT HEAD FINDINGS Brain: Mild diffuse cerebral atrophy. Ventricular dilatation consistent with central atrophy.  Low-attenuation changes in the deep white matter consistent with small vessel ischemia. No evidence of acute infarction, hemorrhage, hydrocephalus, extra-axial collection or mass lesion/mass effect. Vascular: No hyperdense vessel or unexpected calcification. Skull: Normal. Negative for fracture or focal lesion. Sinuses/Orbits: No acute finding. Degenerative changes in the  temporomandibular joints. Other: No significant changes since previous study. CT CERVICAL SPINE FINDINGS Alignment: Slight anterior subluxation of C7 on T1 is likely degenerative. Otherwise normal alignment of the cervical spine. Skull base and vertebrae: No acute fracture. No primary bone lesion or focal pathologic process. Soft tissues and spinal canal: No prevertebral fluid or swelling. No visible canal hematoma. Disc levels: Degenerative changes throughout the cervical spine with narrowed interspaces and endplate hypertrophic changes. Degenerative disc disease at C5-6 and C6-7 levels. Prominent posterior osteophytes at C5-6 and C6-7 cause some impression upon the central canal. C1-2 articulation appears intact. Congenital nonunion of the posterior arch of C1. Degenerative changes throughout the cervical facet joints with posterior coalition at C4-5. Upper chest: There is a fracture of the posterior right first rib but associated with a mildly expansile lucent lesions suggesting a pathologic fracture. Appearance is suspicious for metastasis. Other: None. IMPRESSION: Diffuse degenerative changes throughout the cervical spine. No acute displaced cervical spine fractures identified. There is a pathologic fracture of the right posterior first rib. Electronically Signed   By: Lucienne Capers M.D.   On: 04/02/2016 20:01   Ct Chest W Contrast  Result Date: 04/02/2016 CLINICAL DATA:  Pt reports to the ED for eval of back pain. Pt had some compression fx diagnosed in April. The pain got worse last night and into today. Pt able to move all extremities. Pt denies any numbness, tingling, paralysis, or bowel or bladder incontinence EXAM: CT CHEST, ABDOMEN, AND PELVIS WITH CONTRAST TECHNIQUE: Multidetector CT imaging of the chest, abdomen and pelvis was performed following the standard protocol during bolus administration of intravenous contrast. CONTRAST:  100 mL of Isovue-300 intravenous contrast COMPARISON:  Chest CT,  08/22/2014 FINDINGS: CT CHEST FINDINGS Cardiovascular: Heart is normal in size. There are mild coronary artery calcifications. Great vessels normal in caliber. Mild atherosclerotic plaque is noted along the aortic arch and descending thoracic aorta. No significant stenosis. Mediastinum/Nodes: 14 mm nodule lies in the posterior left thyroid lobe, stable. No mediastinal or hilar masses or enlarged lymph nodes. Lungs/Pleura: Small left a minimal right pleural effusions. There is dependent opacity at the lung bases, most notably in the left lower lobe. This is likely atelectasis. Pneumonia is possible. There is no evidence of pulmonary edema. No lung mass or suspicious nodule. No pneumothorax. CT ABDOMEN PELVIS FINDINGS Hepatobiliary: Liver is unremarkable. There is intra and extra hepatic bile duct dilation. Common bile duct measures 12 mm in greatest dimension. No convincing duct stone. Gallbladder surgically absent. Pancreas: No pancreatic mass or inflammation. Mild dilation of the pancreatic duct. Spleen: Normal in size without focal abnormality. Adrenals/Urinary Tract: Mild bilateral renal cortical thinning. No renal masses or stones. No hydronephrosis. Ureters normal in course and in caliber. Bladder is unremarkable. Stomach/Bowel: Stomach and small bowel are unremarkable. Mild increased stool in the colon. No colonic wall thickening or inflammatory change. Appendix not visualized. No evidence of appendicitis. Vascular/Lymphatic: Mild atherosclerotic calcifications noted along a normal caliber abdominal aorta. No enlarged lymph nodes. Reproductive: Status post hysterectomy. No adnexal masses. Other: No abdominal wall hernia or abnormality. No abdominopelvic ascites. MUSCULOSKELETAL FINDINGS There is a destructive mass arising from the T8 and T9 vertebrae extending to involve the lower aspect  of the T7 vertebra and the upper endplate of S97 as well. Mass measures 4.1 x 5.1 x 4.9 cm in size. It extends posteriorly  into the spinal canal to cause severe central spinal stenosis. It also extends into the neural foramina, right greater than left causing significant narrowing. There are no other bone lesions. The bones are diffusely demineralized. IMPRESSION: 1. There is a destructive spinal mass centered on the T8 and T9 vertebrae, but also involving the lower aspect of the T7 vertebra and upper aspect of T10. The mass causes severe central stenosis and significant narrowing of the neural foramina. This mass may reflect a primary bone neoplasm such as a plasmacytoma. A metastatic lesion is possible although a primary neoplasm is not visualized. Followup thoracic spine MRI with and without contrast to assess the extent of cord compression is recommended. 2. No other bone lesions. 3. Small left and minimal right pleural effusions. There is associated dependent atelectasis most evident in the left lower lobe. 4. No acute abnormalities below the diaphragm. Electronically Signed   By: Lajean Manes M.D.   On: 04/02/2016 20:10   Ct Cervical Spine Wo Contrast  Result Date: 04/02/2016 CLINICAL DATA:  Severe back pain. Compression fracture diagnosed in April. Pain is worsening today. EXAM: CT HEAD WITHOUT CONTRAST CT CERVICAL SPINE WITHOUT CONTRAST TECHNIQUE: Multidetector CT imaging of the head and cervical spine was performed following the standard protocol without intravenous contrast. Multiplanar CT image reconstructions of the cervical spine were also generated. COMPARISON:  CT head 11/30/2006 FINDINGS: CT HEAD FINDINGS Brain: Mild diffuse cerebral atrophy. Ventricular dilatation consistent with central atrophy. Low-attenuation changes in the deep white matter consistent with small vessel ischemia. No evidence of acute infarction, hemorrhage, hydrocephalus, extra-axial collection or mass lesion/mass effect. Vascular: No hyperdense vessel or unexpected calcification. Skull: Normal. Negative for fracture or focal lesion.  Sinuses/Orbits: No acute finding. Degenerative changes in the temporomandibular joints. Other: No significant changes since previous study. CT CERVICAL SPINE FINDINGS Alignment: Slight anterior subluxation of C7 on T1 is likely degenerative. Otherwise normal alignment of the cervical spine. Skull base and vertebrae: No acute fracture. No primary bone lesion or focal pathologic process. Soft tissues and spinal canal: No prevertebral fluid or swelling. No visible canal hematoma. Disc levels: Degenerative changes throughout the cervical spine with narrowed interspaces and endplate hypertrophic changes. Degenerative disc disease at C5-6 and C6-7 levels. Prominent posterior osteophytes at C5-6 and C6-7 cause some impression upon the central canal. C1-2 articulation appears intact. Congenital nonunion of the posterior arch of C1. Degenerative changes throughout the cervical facet joints with posterior coalition at C4-5. Upper chest: There is a fracture of the posterior right first rib but associated with a mildly expansile lucent lesions suggesting a pathologic fracture. Appearance is suspicious for metastasis. Other: None. IMPRESSION: Diffuse degenerative changes throughout the cervical spine. No acute displaced cervical spine fractures identified. There is a pathologic fracture of the right posterior first rib. Electronically Signed   By: Lucienne Capers M.D.   On: 04/02/2016 20:01   Ct Abdomen Pelvis W Contrast  Result Date: 04/02/2016 CLINICAL DATA:  Pt reports to the ED for eval of back pain. Pt had some compression fx diagnosed in April. The pain got worse last night and into today. Pt able to move all extremities. Pt denies any numbness, tingling, paralysis, or bowel or bladder incontinence EXAM: CT CHEST, ABDOMEN, AND PELVIS WITH CONTRAST TECHNIQUE: Multidetector CT imaging of the chest, abdomen and pelvis was performed following the standard protocol  during bolus administration of intravenous contrast.  CONTRAST:  100 mL of Isovue-300 intravenous contrast COMPARISON:  Chest CT, 08/22/2014 FINDINGS: CT CHEST FINDINGS Cardiovascular: Heart is normal in size. There are mild coronary artery calcifications. Great vessels normal in caliber. Mild atherosclerotic plaque is noted along the aortic arch and descending thoracic aorta. No significant stenosis. Mediastinum/Nodes: 14 mm nodule lies in the posterior left thyroid lobe, stable. No mediastinal or hilar masses or enlarged lymph nodes. Lungs/Pleura: Small left a minimal right pleural effusions. There is dependent opacity at the lung bases, most notably in the left lower lobe. This is likely atelectasis. Pneumonia is possible. There is no evidence of pulmonary edema. No lung mass or suspicious nodule. No pneumothorax. CT ABDOMEN PELVIS FINDINGS Hepatobiliary: Liver is unremarkable. There is intra and extra hepatic bile duct dilation. Common bile duct measures 12 mm in greatest dimension. No convincing duct stone. Gallbladder surgically absent. Pancreas: No pancreatic mass or inflammation. Mild dilation of the pancreatic duct. Spleen: Normal in size without focal abnormality. Adrenals/Urinary Tract: Mild bilateral renal cortical thinning. No renal masses or stones. No hydronephrosis. Ureters normal in course and in caliber. Bladder is unremarkable. Stomach/Bowel: Stomach and small bowel are unremarkable. Mild increased stool in the colon. No colonic wall thickening or inflammatory change. Appendix not visualized. No evidence of appendicitis. Vascular/Lymphatic: Mild atherosclerotic calcifications noted along a normal caliber abdominal aorta. No enlarged lymph nodes. Reproductive: Status post hysterectomy. No adnexal masses. Other: No abdominal wall hernia or abnormality. No abdominopelvic ascites. MUSCULOSKELETAL FINDINGS There is a destructive mass arising from the T8 and T9 vertebrae extending to involve the lower aspect of the T7 vertebra and the upper endplate of P23  as well. Mass measures 4.1 x 5.1 x 4.9 cm in size. It extends posteriorly into the spinal canal to cause severe central spinal stenosis. It also extends into the neural foramina, right greater than left causing significant narrowing. There are no other bone lesions. The bones are diffusely demineralized. IMPRESSION: 1. There is a destructive spinal mass centered on the T8 and T9 vertebrae, but also involving the lower aspect of the T7 vertebra and upper aspect of T10. The mass causes severe central stenosis and significant narrowing of the neural foramina. This mass may reflect a primary bone neoplasm such as a plasmacytoma. A metastatic lesion is possible although a primary neoplasm is not visualized. Followup thoracic spine MRI with and without contrast to assess the extent of cord compression is recommended. 2. No other bone lesions. 3. Small left and minimal right pleural effusions. There is associated dependent atelectasis most evident in the left lower lobe. 4. No acute abnormalities below the diaphragm. Electronically Signed   By: Lajean Manes M.D.   On: 04/02/2016 20:10    Assessment/Plan 1. T7-10 spinal mass -a long family meeting was had with the patient and her son yesterday, via telephone and the decision was made to pursue a biopsy of this mass. -she has been NPO and her labs have been reviewed -her Mallampati is a 3.  D/W Dr. Estanislado Pandy and we will monitor the patient.  He does not feel as if anesthesia needs to be involved at this time. -Risks and Benefits discussed with the patient including, but not limited to bleeding, infection, damage to adjacent structures or low yield requiring additional tests.  Given the specific area we did discuss the risk of paralysis. All of the patient's questions were answered, patient is agreeable to proceed. Consent signed and in chart.  Thank you for this interesting consult.  I greatly enjoyed meeting Mikayla Marks and look forward to  participating in their care.  A copy of this report was sent to the requesting provider on this date.  Electronically Signed: Henreitta Cea 04/04/2016, 10:13 AM   I spent a total of 40 Minutes    in face to face in clinical consultation, greater than 50% of which was counseling/coordinating care for thoracic spinal mass

## 2016-04-04 NOTE — Care Management (Addendum)
Spoke with patient's brother Jilda Roche via phone . Discussed PT recommendations for home health PT , [name] is in agreement .  Asked if there were any concerns in patient's daughter [name] taking care of patient at discharge, [name] stated no, that "Quantina is fine to go home with [employer]". Will need home health orders and face to face. Patient has walker at home , Richard requesting bedside commode.  Confirmed face sheet information with Richard.  Left message for social worker.   Magdalen Spatz RN BSN [phone]

## 2016-04-04 NOTE — Sedation Documentation (Signed)
Patient is resting comfortably. 

## 2016-04-04 NOTE — Progress Notes (Signed)
PROGRESS NOTE  Mikayla Marks [id number] DOB: 10-15-1927 DOA: 04/02/2016 PCP: Kathlene November, MD   LOS: 1 day   Brief Narrative: Mikayla Marks is a 82 y.o. female with a past medical history significant for paranoid schizophrenia and HTN who presents with back pain for 4 months. The patient was in her usual state of health until around mid-February when she went on a long car ride with her daughter and developed some back pain.  By April, this back pain had gotten much worse and she had a new severe kyphosis:  Mid April 2017: Seen at [facility], Dr. Mina Marble PM&R, MRI spine ordered which showed "lesions within the T8 and T9 vertebral bodies with associated pathologic fractures and osseous retropulsion...worrisome for metastatic disease, myeloma, or lymphoma".  Was referred to Oncology, did not call back. 10/26/15: Saw PCP in follow up, was recommended to see Oncology, refused. 11/11/15: Saw Pulmonology as self-referral for pleural effusions (noted incidentally on MRI of spine that showed metastasis) was recommended to see Oncology, have PET and biopsy via IR, refused 12/08/15: Saw PCP again, SPEP ordered and no M-spike (only "band of restricted mobility" in beta globulins), again referral to Hematology-Oncology and also IR that week June 2017: Saw Orthopedic surgery who recommended biopsy, family and patient declined again  Assessment & Plan: Principal Problem:   Intractable back pain Active Problems:   Schizophrenia ([place])   Essential hypertension   Cancer with unknown primary site [facility])   Prolonged QT interval   Palliative care encounter   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  She is currently not an operative candidate. - appreciate palliative  input - oncology consulted, rad onc to see.  - appreciate IR, she is s/p biopsy.   HTN - Hypertensive at admission, suspect chronic plus pain related.  - BP within normal parameters  Paranoid schizophrenia - Not in active treatment  Prolonged QTc - Avoid QT prolonging agents  Social - very difficult situation as patient is under care of daughter [name] who -- based on my limited interaction -- does appear to have underlying psychiatric issues as well. It is quite clear that patient did not received adequate care in the past 6 months since her spinal tumor diagnosis. Under these circumstances she currently does not have a safe discharge plan and is not safe to go home.  - awaiting Radiation Oncology evaluation, if she needs radiation per Oncology recommendations might need to be done inpatient at [facility]   DVT prophylaxis: SCD Code Status: Partial Family Communication: discussed with Mr. Nehemiah Massed [facility]) over the phone [phone]) Disposition Plan: TBD  Consultants:   Palliative  Neurosurgery  Oncology  Radiation Oncology  Procedures:   IR guided biopsy 10/9  Antimicrobials:  None    Subjective: - no chest pain, shortness of breath, no abdominal pain, nausea or vomiting.  - awaiting biopsy   Objective: Vitals:   04/04/16 1116 04/04/16 1132 04/04/16 1135 04/04/16 1142  BP: (!) 147/67 (!) 149/73 133/71 130/73  Pulse: (!) 101 95 90 93  Resp: 17 16 15 15   Temp:      TempSrc:      SpO2: 97% 100% 100% 100%  Weight:      Height:        Intake/Output  Summary (Last 24 hours) at 04/04/16 1243 Last data filed at 04/04/16 0552  Gross per 24 hour  Intake              240 ml  Output              700 ml  Net             -460 ml   Filed Weights   04/02/16 1404 04/02/16 2311  Weight: 73 kg (161 lb) 64 kg (141 lb)    Examination: Constitutional: NAD Vitals:   04/04/16 1116 04/04/16 1132 04/04/16 1135 04/04/16 1142  BP: (!) 147/67 (!) 149/73 133/71  130/73  Pulse: (!) 101 95 90 93  Resp: 17 16 15 15   Temp:      TempSrc:      SpO2: 97% 100% 100% 100%  Weight:      Height:       Respiratory: clear to auscultation bilaterally, no wheezing, no crackles.  Cardiovascular: Regular rate and rhythm, no murmurs / rubs / gallops. No LE edema.  Abdomen: no tenderness. Bowel sounds positive.  Musculoskeletal: no clubbing / cyanosis.  Skin: no rashes. Chronic venous stasis changes LE. Neurologic: non focal   Psychiatric: Normal judgment and insight. Alert and oriented x 3. Normal mood.    Data Reviewed: I have personally reviewed following labs and imaging studies  CBC:  Recent Labs Lab 04/02/16 1723  WBC 7.0  NEUTROABS 5.5  HGB 13.9  HCT 42.1  MCV 93.1  PLT [id number]   Basic Metabolic Panel:  Recent Labs Lab 04/02/16 1723  NA 138  K 3.8  CL 103  CO2 24  GLUCOSE 109*  BUN 12  CREATININE 0.87  CALCIUM 9.9   GFR: Estimated Creatinine Clearance: 38.8 mL/min (by C-G formula based on SCr of 0.87 mg/dL). Liver Function Tests:  Recent Labs Lab 04/02/16 1723  AST 22  ALT 19  ALKPHOS 55  BILITOT 0.7  PROT 7.0  ALBUMIN 4.0    Recent Labs Lab 04/02/16 1723  LIPASE 31   No results for input(s): AMMONIA in the last 168 hours. Coagulation Profile:  Recent Labs Lab 04/02/16 1723  INR 1.03   Cardiac Enzymes: No results for input(s): CKTOTAL, CKMB, CKMBINDEX, TROPONINI in the last 168 hours. BNP (last 3 results)  Recent Labs  11/11/15 1623  PROBNP 68.0   HbA1C: No results for input(s): HGBA1C in the last 72 hours. CBG: No results for input(s): GLUCAP in the last 168 hours. Lipid Profile: No results for input(s): CHOL, HDL, LDLCALC, TRIG, CHOLHDL, LDLDIRECT in the last 72 hours. Thyroid Function Tests: No results for input(s): TSH, T4TOTAL, FREET4, T3FREE, THYROIDAB in the last 72 hours. Anemia Panel: No results for input(s): VITAMINB12, FOLATE, FERRITIN, TIBC, IRON, RETICCTPCT in the last 72 hours. Urine  analysis:    Component Value Date/Time   COLORURINE YELLOW 04/02/2016 [place] 04/02/2016 1617   LABSPEC 1.010 04/02/2016 1617   PHURINE 6.0 04/02/2016 1617   GLUCOSEU NEGATIVE 04/02/2016 1617   HGBUR TRACE (A) 04/02/2016 1617   HGBUR trace 03/04/2008 1529   BILIRUBINUR NEGATIVE 04/02/2016 1617   KETONESUR 15 (A) 04/02/2016 1617   PROTEINUR NEGATIVE 04/02/2016 1617   UROBILINOGEN 0.2 10/23/2010 0505   NITRITE NEGATIVE 04/02/2016 1617   LEUKOCYTESUR NEGATIVE 04/02/2016 1617   Sepsis Labs: Invalid input(s): PROCALCITONIN, LACTICIDVEN  Recent Results (from the past 240 hour(s))  Urine culture     Status: None   Collection Time: 04/02/16  4:18 PM  Result Value Ref Range Status   Specimen Description URINE, CLEAN CATCH  Final   Special Requests NONE  Final   Culture NO GROWTH  Final   Report Status 04/03/2016 FINAL  Final      Radiology Studies: Dg Chest 2 View  Result Date: 04/02/2016 CLINICAL DATA:  Severe back pain.  Left-sided chest pain today. EXAM: CHEST  2 VIEW COMPARISON:  Chest radiograph 11/11/2015, spine MRI 10/15/2015 FINDINGS: The cardiac silhouette is enlarged. Mediastinal contours appear intact. There is no evidence of pneumothorax. There has been interval development of moderate in size left pleural effusion. Smaller right subpulmonic effusion is also possible. There is mild interstitial prominence bilaterally. If there is a significantly exaggerated thoracic kyphosis centered in the second third of the thoracic spine. The thoracic vertebrae are not well evaluated due to osteopenia and positioning, but new or advanced compression fracture is suspected based on the exaggerated kyphotic deformity. IMPRESSION: Moderate in size left pleural effusion. Possible small right subpulmonic pleural effusion. Mild pulmonary vascular congestion. Significantly exaggerated thoracic kyphosis when compared to patient's prior chest radiograph and MRI,which is suggestive of  new or worsening compression fracture of one or more thoracic vertebral bodies.The thoracic vertebrae are not well evaluated due to osteopenia and positioning. Patient has a known T8, T9, and T11 expansile lesions with associated pathologic fractures of T8 and T9 demonstrated by MRI in April 2017. Electronically Signed   By: Fidela Salisbury M.D.   On: 04/02/2016 17:42   Ct Head Wo Contrast  Result Date: 04/02/2016 CLINICAL DATA:  Severe back pain. Compression fracture diagnosed in April. Pain is worsening today. EXAM: CT HEAD WITHOUT CONTRAST CT CERVICAL SPINE WITHOUT CONTRAST TECHNIQUE: Multidetector CT imaging of the head and cervical spine was performed following the standard protocol without intravenous contrast. Multiplanar CT image reconstructions of the cervical spine were also generated. COMPARISON:  CT head 11/30/2006 FINDINGS: CT HEAD FINDINGS Brain: Mild diffuse cerebral atrophy. Ventricular dilatation consistent with central atrophy. Low-attenuation changes in the deep white matter consistent with small vessel ischemia. No evidence of acute infarction, hemorrhage, hydrocephalus, extra-axial collection or mass lesion/mass effect. Vascular: No hyperdense vessel or unexpected calcification. Skull: Normal. Negative for fracture or focal lesion. Sinuses/Orbits: No acute finding. Degenerative changes in the temporomandibular joints. Other: No significant changes since previous study. CT CERVICAL SPINE FINDINGS Alignment: Slight anterior subluxation of C7 on T1 is likely degenerative. Otherwise normal alignment of the cervical spine. Skull base and vertebrae: No acute fracture. No primary bone lesion or focal pathologic process. Soft tissues and spinal canal: No prevertebral fluid or swelling. No visible canal hematoma. Disc levels: Degenerative changes throughout the cervical spine with narrowed interspaces and endplate hypertrophic changes. Degenerative disc disease at C5-6 and C6-7 levels. Prominent  posterior osteophytes at C5-6 and C6-7 cause some impression upon the central canal. C1-2 articulation appears intact. Congenital nonunion of the posterior arch of C1. Degenerative changes throughout the cervical facet joints with posterior coalition at C4-5. Upper chest: There is a fracture of the posterior right first rib but associated with a mildly expansile lucent lesions suggesting a pathologic fracture. Appearance is suspicious for metastasis. Other: None. IMPRESSION: Diffuse degenerative changes throughout the cervical spine. No acute displaced cervical spine fractures identified. There is a pathologic fracture of the right posterior first rib. Electronically Signed   By: Lucienne Capers M.D.   On: 04/02/2016 20:01   Ct Chest W Contrast  Result Date: 04/02/2016 CLINICAL DATA:  Pt reports to the  ED for eval of back pain. Pt had some compression fx diagnosed in April. The pain got worse last night and into today. Pt able to move all extremities. Pt denies any numbness, tingling, paralysis, or bowel or bladder incontinence EXAM: CT CHEST, ABDOMEN, AND PELVIS WITH CONTRAST TECHNIQUE: Multidetector CT imaging of the chest, abdomen and pelvis was performed following the standard protocol during bolus administration of intravenous contrast. CONTRAST:  100 mL of Isovue-300 intravenous contrast COMPARISON:  Chest CT, 08/22/2014 FINDINGS: CT CHEST FINDINGS Cardiovascular: Heart is normal in size. There are mild coronary artery calcifications. Great vessels normal in caliber. Mild atherosclerotic plaque is noted along the aortic arch and descending thoracic aorta. No significant stenosis. Mediastinum/Nodes: 14 mm nodule lies in the posterior left thyroid lobe, stable. No mediastinal or hilar masses or enlarged lymph nodes. Lungs/Pleura: Small left a minimal right pleural effusions. There is dependent opacity at the lung bases, most notably in the left lower lobe. This is likely atelectasis. Pneumonia is possible.  There is no evidence of pulmonary edema. No lung mass or suspicious nodule. No pneumothorax. CT ABDOMEN PELVIS FINDINGS Hepatobiliary: Liver is unremarkable. There is intra and extra hepatic bile duct dilation. Common bile duct measures 12 mm in greatest dimension. No convincing duct stone. Gallbladder surgically absent. Pancreas: No pancreatic mass or inflammation. Mild dilation of the pancreatic duct. Spleen: Normal in size without focal abnormality. Adrenals/Urinary Tract: Mild bilateral renal cortical thinning. No renal masses or stones. No hydronephrosis. Ureters normal in course and in caliber. Bladder is unremarkable. Stomach/Bowel: Stomach and small bowel are unremarkable. Mild increased stool in the colon. No colonic wall thickening or inflammatory change. Appendix not visualized. No evidence of appendicitis. Vascular/Lymphatic: Mild atherosclerotic calcifications noted along a normal caliber abdominal aorta. No enlarged lymph nodes. Reproductive: Status post hysterectomy. No adnexal masses. Other: No abdominal wall hernia or abnormality. No abdominopelvic ascites. MUSCULOSKELETAL FINDINGS There is a destructive mass arising from the T8 and T9 vertebrae extending to involve the lower aspect of the T7 vertebra and the upper endplate of [id number] as well. Mass measures 4.1 x 5.1 x 4.9 cm in size. It extends posteriorly into the spinal canal to cause severe central spinal stenosis. It also extends into the neural foramina, right greater than left causing significant narrowing. There are no other bone lesions. The bones are diffusely demineralized. IMPRESSION: 1. There is a destructive spinal mass centered on the T8 and T9 vertebrae, but also involving the lower aspect of the T7 vertebra and upper aspect of T10. The mass causes severe central stenosis and significant narrowing of the neural foramina. This mass may reflect a primary bone neoplasm such as a plasmacytoma. A metastatic lesion is possible although a  primary neoplasm is not visualized. Followup thoracic spine MRI with and without contrast to assess the extent of cord compression is recommended. 2. No other bone lesions. 3. Small left and minimal right pleural effusions. There is associated dependent atelectasis most evident in the left lower lobe. 4. No acute abnormalities below the diaphragm. Electronically Signed   By: Lajean Manes M.D.   On: 04/02/2016 20:10   Ct Cervical Spine Wo Contrast  Result Date: 04/02/2016 CLINICAL DATA:  Severe back pain. Compression fracture diagnosed in April. Pain is worsening today. EXAM: CT HEAD WITHOUT CONTRAST CT CERVICAL SPINE WITHOUT CONTRAST TECHNIQUE: Multidetector CT imaging of the head and cervical spine was performed following the standard protocol without intravenous contrast. Multiplanar CT image reconstructions of the cervical spine were also generated. COMPARISON:  CT head 11/30/2006 FINDINGS: CT HEAD FINDINGS Brain: Mild diffuse cerebral atrophy. Ventricular dilatation consistent with central atrophy. Low-attenuation changes in the deep white matter consistent with small vessel ischemia. No evidence of acute infarction, hemorrhage, hydrocephalus, extra-axial collection or mass lesion/mass effect. Vascular: No hyperdense vessel or unexpected calcification. Skull: Normal. Negative for fracture or focal lesion. Sinuses/Orbits: No acute finding. Degenerative changes in the temporomandibular joints. Other: No significant changes since previous study. CT CERVICAL SPINE FINDINGS Alignment: Slight anterior subluxation of C7 on T1 is likely degenerative. Otherwise normal alignment of the cervical spine. Skull base and vertebrae: No acute fracture. No primary bone lesion or focal pathologic process. Soft tissues and spinal canal: No prevertebral fluid or swelling. No visible canal hematoma. Disc levels: Degenerative changes throughout the cervical spine with narrowed interspaces and endplate hypertrophic changes.  Degenerative disc disease at C5-6 and C6-7 levels. Prominent posterior osteophytes at C5-6 and C6-7 cause some impression upon the central canal. C1-2 articulation appears intact. Congenital nonunion of the posterior arch of C1. Degenerative changes throughout the cervical facet joints with posterior coalition at C4-5. Upper chest: There is a fracture of the posterior right first rib but associated with a mildly expansile lucent lesions suggesting a pathologic fracture. Appearance is suspicious for metastasis. Other: None. IMPRESSION: Diffuse degenerative changes throughout the cervical spine. No acute displaced cervical spine fractures identified. There is a pathologic fracture of the right posterior first rib. Electronically Signed   By: Lucienne Capers M.D.   On: 04/02/2016 20:01   Ct Abdomen Pelvis W Contrast  Result Date: 04/02/2016 CLINICAL DATA:  Pt reports to the ED for eval of back pain. Pt had some compression fx diagnosed in April. The pain got worse last night and into today. Pt able to move all extremities. Pt denies any numbness, tingling, paralysis, or bowel or bladder incontinence EXAM: CT CHEST, ABDOMEN, AND PELVIS WITH CONTRAST TECHNIQUE: Multidetector CT imaging of the chest, abdomen and pelvis was performed following the standard protocol during bolus administration of intravenous contrast. CONTRAST:  100 mL of Isovue-300 intravenous contrast COMPARISON:  Chest CT, 08/22/2014 FINDINGS: CT CHEST FINDINGS Cardiovascular: Heart is normal in size. There are mild coronary artery calcifications. Great vessels normal in caliber. Mild atherosclerotic plaque is noted along the aortic arch and descending thoracic aorta. No significant stenosis. Mediastinum/Nodes: 14 mm nodule lies in the posterior left thyroid lobe, stable. No mediastinal or hilar masses or enlarged lymph nodes. Lungs/Pleura: Small left a minimal right pleural effusions. There is dependent opacity at the lung bases, most notably in  the left lower lobe. This is likely atelectasis. Pneumonia is possible. There is no evidence of pulmonary edema. No lung mass or suspicious nodule. No pneumothorax. CT ABDOMEN PELVIS FINDINGS Hepatobiliary: Liver is unremarkable. There is intra and extra hepatic bile duct dilation. Common bile duct measures 12 mm in greatest dimension. No convincing duct stone. Gallbladder surgically absent. Pancreas: No pancreatic mass or inflammation. Mild dilation of the pancreatic duct. Spleen: Normal in size without focal abnormality. Adrenals/Urinary Tract: Mild bilateral renal cortical thinning. No renal masses or stones. No hydronephrosis. Ureters normal in course and in caliber. Bladder is unremarkable. Stomach/Bowel: Stomach and small bowel are unremarkable. Mild increased stool in the colon. No colonic wall thickening or inflammatory change. Appendix not visualized. No evidence of appendicitis. Vascular/Lymphatic: Mild atherosclerotic calcifications noted along a normal caliber abdominal aorta. No enlarged lymph nodes. Reproductive: Status post hysterectomy. No adnexal masses. Other: No abdominal wall hernia or abnormality. No abdominopelvic ascites. MUSCULOSKELETAL  FINDINGS There is a destructive mass arising from the T8 and T9 vertebrae extending to involve the lower aspect of the T7 vertebra and the upper endplate of QA348G as well. Mass measures 4.1 x 5.1 x 4.9 cm in size. It extends posteriorly into the spinal canal to cause severe central spinal stenosis. It also extends into the neural foramina, right greater than left causing significant narrowing. There are no other bone lesions. The bones are diffusely demineralized. IMPRESSION: 1. There is a destructive spinal mass centered on the T8 and T9 vertebrae, but also involving the lower aspect of the T7 vertebra and upper aspect of T10. The mass causes severe central stenosis and significant narrowing of the neural foramina. This mass may reflect a primary bone neoplasm  such as a plasmacytoma. A metastatic lesion is possible although a primary neoplasm is not visualized. Followup thoracic spine MRI with and without contrast to assess the extent of cord compression is recommended. 2. No other bone lesions. 3. Small left and minimal right pleural effusions. There is associated dependent atelectasis most evident in the left lower lobe. 4. No acute abnormalities below the diaphragm. Electronically Signed   By: Lajean Manes M.D.   On: 04/02/2016 20:10     Scheduled Meds: . bupivacaine (PF)      . cycloSPORINE  1 drop Both Eyes BID  . dexamethasone  4 mg Intravenous Q12H  . docusate sodium  100 mg Oral BID  . fentaNYL      . midazolam      . oxyCODONE  5 mg Oral Q6H  . polyethylene glycol  17 g Oral Daily  . senna  1 tablet Oral BID   Continuous Infusions: . sodium chloride       Marzetta Board, MD, PhD [facility] Pager [phone]  If 7PM-7AM, please contact night-coverage [email or web address] Password [email or web address] 04/04/2016, 12:43 PM

## 2016-04-04 NOTE — Progress Notes (Addendum)
Daily Progress Note   Patient Name: Mikayla Marks       Date: 04/04/2016 DOB: 1927-12-06  Age: 82 y.o. MRN#: [id number] Attending Physician: Caren Griffins, MD Primary Care Physician: Kathlene November, MD Admit Date: 04/02/2016  Reason for Consultation/Follow-up: Pain control  Subjective: Patient reports her pain is significantly better today. She is talkative, putting on make up during our discussion. She is ready for her biopsy today. Her daughter [name] is present in the room and has lots of questions about her mother's care and any updates on her healthcare.   Length of Stay: 1  Current Medications: Scheduled Meds:  . cycloSPORINE  1 drop Both Eyes BID  . dexamethasone  4 mg Intravenous Q12H  . docusate sodium  100 mg Oral BID  . oxyCODONE  5 mg Oral Q6H  . senna  1 tablet Oral BID    Continuous Infusions:    PRN Meds: acetaminophen **OR** acetaminophen, methocarbamol, morphine injection, oxyCODONE, polyethylene glycol  Physical Exam          Vital Signs: BP 130/60 (BP Location: Left Arm)   Pulse 76   Temp 97.5 F (36.4 C) (Oral)   Resp 18   Ht 5' (1.524 m)   Wt 64 kg (141 lb)   SpO2 96%   BMI 27.54 kg/m  SpO2: SpO2: 96 % O2 Device: O2 Device: Not Delivered O2 Flow Rate:    General: elderly woman sitting up in bed, appears comfortable, pleasant and talkative HEENT: [place]/AT, EOMI, sclera anicteric, mucus membranes moist Abd: BS+, mildly distended but soft and non-tender  Neuro: alert and oriented x 3, speech is pressured.   Intake/output summary:  Intake/Output Summary (Last 24 hours) at 04/04/16 1040 Last data filed at 04/04/16 0552  Gross per 24 hour  Intake              360 ml  Output              700 ml  Net             -340 ml   LBM: Last BM Date:  04/01/16 Baseline Weight: Weight: 73 kg (161 lb) Most recent weight: Weight: 64 kg (141 lb)       Palliative Assessment/Data:    Flowsheet Rows   Flowsheet Row Most Recent Value  Intake Tab  Referral Department  Hospitalist  Unit at Time of Referral  Med/Surg Unit  Palliative Care Primary Diagnosis  Cancer  Date Notified  04/02/16  Palliative Care Type  New Palliative care  Reason for referral  Pain, Clarify Goals of Care, Psychosocial or Spiritual support  Date of Admission  04/02/16  Date first seen by Palliative Care  04/03/16  # of days Palliative referral response time  1 Day(s)  # of days IP prior to Palliative referral  0  Clinical Assessment  Palliative Performance Scale Score  40%  Pain Max last 24 hours  10  Pain Min Last 24 hours  3  Dyspnea Max Last 24 Hours  0  Dyspnea Min Last 24 hours  0  Nausea Max Last 24 Hours  0  Nausea Min Last 24 Hours  0  Anxiety Max Last 24 Hours  0  Anxiety Min Last 24 Hours  0  Psychosocial & Spiritual Assessment  Palliative Care Outcomes  Patient/Family meeting held?  Yes  Who was at the meeting?  son, brother, DIL  Palliative Care Outcomes  Improved pain interventions, Clarified goals of care, Changed CPR status, Provided psychosocial or spiritual support  Patient/Family wishes: Interventions discontinued/not started   Mechanical Ventilation, BiPAP, Trach, NIPPV  Palliative Care follow-up planned  Yes, Facility      Patient Active Problem List   Diagnosis Date Noted  . Prolonged QT interval 04/03/2016  . Palliative care encounter   . Intractable back pain 04/02/2016  . Cancer with unknown primary site ([place]) 04/02/2016  . Pathologic vertebral fracture 11/12/2015  . Dyspnea 11/11/2015  . Pleural effusion, bilateral 11/11/2015  . PCP NOTES >>>>>>>>>>>>>>>>>>>>>>>>>>>>>>>> 10/26/2015  . Hammer toe of left foot 01/30/2015  . Bilateral leg edema 09/10/2014  . Onychomycosis 05/19/2014  . Pain in lower limb 05/19/2014  .  Fatigue 02/11/2014  . Medicare annual wellness visit, subsequent 09/02/2013  . Encounter for general adult medical examination without abnormal findings 09/02/2013  . Pain in joint, foot, left 07/10/2013  . Corn of toe 02/04/2013  . Varicose veins of lower extremities with inflammation 11/20/2012  . Varicose veins of lower extremities with other complications 85/27/7824  . DEGENERATIVE JOINT DISEASE 01/12/2010  . Schizophrenia ([place]) 06/30/2009  . SKIN CANCER, LEG 05/29/2009  . Cancer of skin of leg 05/29/2009  . OSTEOPOROSIS 08/26/2008  . HYPERLIPIDEMIA 10/16/2006  . Essential hypertension 10/16/2006    Palliative Care Assessment & Plan   Patient Profile: 82 yo woman with past medical history of Paranoid schizophrenia, hypertension, prolonged QT interval, severe back pain starting in April 2017 admitted on 04/02/2016 with severe back pain. Per chart review, MRI of spine in April 2017 showed "lesions within the T8 and T9 vertebral bodies with associated pathologic fractures and osseous retropulsion worrisome for metastatic disease, myeloma, or lymphoma"'s. She was referred to oncology at that time but did not follow-up. On 10/26/2015 she saw her PCP because of ongoing pain and again was referred to oncology which she refused. On 11/11/2015 she saw pulmonary for pleural effusions and was additionally referred to oncology, as well as to have a PET scan and biopsy of mass seen on T8 and T9. Patient again refused. On 12/08/2015 she again saw her PCP and was for referred to hematology and oncology and she declined. Repeat CT scan on 10/7 shows destructive spinal mass at T8 and T9 but also involving T7 and T10. This mass is causing severe central stenosis and narrowing; also pathological fracture of  right first rib. In April of this year MRI did not show any evidence of spinal cord compression. Patient is still able to walk, but is standing in a bent position because of severe pain. In speaking with her  family her schizophrenia as well as her daughter's mental illness (with whom she lives) has directly impacted her ability to follow through with outpatient workup for work up, what is likely a cancerous mass. Patient as well as her daughter share paranoid delusions regarding toxic fumes, nuclear waste, thinking that [facility] is not a real  hospitals, and people who are identifying themselves as physicians are imposters. On 04/03/2016 patient's daughter [name], had to be escorted off the premises by security because of out-of-control behavior. She was instructed that she could not return to the unit.    Assessment: Today, Imogene Burn and I met with the patient and her daughter, [name]. The patient's pain seems remarkably improved per her description. She appeared comfortable on exam and thankful for the relief she is experiencing. She is well aware of the biopsy procedure today and understands the risks and benefits of this procedure. Given the patient's pressured speech (unclear if this is close to baseline, daughter thought it was but she also has schizophrenia) the patient may benefit from a decrease in her steroids. She was not displaying any overt signs of psychosis.   Her daughter had numerous questions about her mother's condition and what next steps were being taken.  I revealed only to the daughter that her mother was going for a biopsy today of the mass in her back and that our goal was to continue adequate pain control. With the daughter's actions yesterday, I did not want to upset her.   Recommendations/Plan:  Recommend to decrease steroids by half, Decadron 4 mg IV daily  Continue Morphine 4 mg IV Q3H PRN or Oxycodone 5 mg PO Q4H PRN, Oxycodone 5 mg Q6H scheduled   Change Miralax to scheduled daily. Continue Senokot 1 tab BID.  Goals of Care and Additional Recommendations:  Limitations on Scope of Treatment: No Surgical Procedures and No Tracheostomy  Code Status:  DNR    Prognosis:   Unable to determine  Discharge Planning:  To Be Determined  Care plan was discussed with patient and daughter [name].  Thank you for allowing the Palliative Medicine Team to assist in the care of this patient.   Time In: 10:00AM Time Out: 10:25AM Total Time 25 mins Prolonged Time Billed  no       Greater than 50%  of this time was spent counseling and coordinating care related to the above assessment and plan.  Albin Felling, MD Imogene Burn, PA-C Palliative Medicine  Please contact Palliative Medicine Team phone at [phone] for questions and concerns.

## 2016-04-04 NOTE — Evaluation (Signed)
Physical Therapy Evaluation Patient Details Name: Mikayla Marks MRN: [id number] DOB: [birth year] Today's Date: 04/04/2016   History of Present Illness  82 y.o. female admitted with back pain and now found to have thoracic spine bone mass T7-10. To undergo biopsy. PMH: schizoaffective disorder, carcinoma, osteoporosis, HTN.  Clinical Impression  Pt denying back pain during session, stating that her neck is more sore than her back. Pt currently refusing to ambulate but did demonstrate potential to increase her activity tolerance as pain continues to be controlled. Anticipating that the pt will return home with family support but no family was present during the time of the evaluation.  PT to continue to follow and advance as tolerated.     Follow Up Recommendations Home health PT;Supervision for mobility/OOB    Equipment Recommendations  None recommended by PT (pt reports having needed DME at home)    Recommendations for Other Services       Precautions / Restrictions Precautions Precautions: Fall Restrictions Weight Bearing Restrictions: No      Mobility  Bed Mobility Overal bed mobility: Needs Assistance Bed Mobility: Supine to Sit     Supine to sit: Mod assist Sit to supine: Mod assist (assist provided at LEs)   General bed mobility comments: assist provided at trunk to come to sitting EOB. Encouraging logroll technique.   Transfers Overall transfer level: Needs assistance Equipment used: Rolling walker (2 wheeled) Transfers: Sit to/from Stand Sit to Stand: Min assist         General transfer comment: mild instability with initial stand, improving with time.   Ambulation/Gait             General Gait Details: Pt refusing to ambulate at this time but willing to perform marching at EOB and take 4 lateral steps along bed.   Stairs            Wheelchair Mobility    Modified Rankin (Stroke Patients Only)       Balance Overall balance assessment:  Needs assistance Sitting-balance support: Bilateral upper extremity supported Sitting balance-Leahy Scale: Fair     Standing balance support: Bilateral upper extremity supported Standing balance-Leahy Scale: Poor Standing balance comment: using rw for support                             Pertinent Vitals/Pain Pain Assessment: Faces Faces Pain Scale: Hurts a little bit Pain Location: neck Pain Descriptors / Indicators: Sore Pain Intervention(s): Limited activity within patient's tolerance;Monitored during session    Home Living Family/patient expects to be discharged to:: Private residence Living Arrangements: Children Available Help at Discharge: Family;Available 24 hours/day Type of Home: House Home Access: Level entry     Home Layout: One level Home Equipment: Walker - 2 wheels;Wheelchair - manual      Prior Function Level of Independence: Independent with assistive device(s)         Comments: Pt reports that she is usually independent with her mobility but recently having difficulty with any ambulation due to back pain.      Hand Dominance        Extremity/Trunk Assessment   Upper Extremity Assessment: Generalized weakness           Lower Extremity Assessment: Generalized weakness      Cervical / Trunk Assessment: Kyphotic  Communication   Communication: No difficulties  Cognition Arousal/Alertness: Awake/alert Behavior During Therapy: WFL for tasks assessed/performed Overall Cognitive Status: No family/caregiver present to determine  baseline cognitive functioning                      General Comments General comments (skin integrity, edema, etc.): Pt refusing to sit up in chair, wanting to rest until biopsy.     Exercises     Assessment/Plan    PT Assessment Patient needs continued PT services  PT Problem List Decreased strength;Decreased range of motion;Decreased activity tolerance;Decreased balance;Decreased mobility           PT Treatment Interventions DME instruction;Gait training;Functional mobility training;Therapeutic activities;Therapeutic exercise;Balance training;Patient/family education    PT Goals (Current goals can be found in the Care Plan section)  Acute Rehab PT Goals Patient Stated Goal: get back to being active and return home PT Goal Formulation: With patient Time For Goal Achievement: 04/18/16 Potential to Achieve Goals: Good    Frequency Min 3X/week   Barriers to discharge        Co-evaluation               End of Session   Activity Tolerance: Patient limited by fatigue (pt denies any back pain during session) Patient left: in bed;with call bell/phone within reach;with SCD's reapplied Nurse Communication: Mobility status         Time: [id number] PT Time Calculation (min) (ACUTE ONLY): 24 min   Charges:   PT Evaluation $PT Eval Moderate Complexity: 1 Procedure PT Treatments $Therapeutic Activity: 8-22 mins   PT G Codes:        Cassell Clement, PT, CSCS Pager [phone] Office [phone]  04/04/2016, 11:42 AM

## 2016-04-05 ENCOUNTER — Encounter (HOSPITAL_COMMUNITY): Payer: Self-pay | Admitting: Interventional Radiology

## 2016-04-05 NOTE — Progress Notes (Signed)
PROGRESS NOTE  Mikayla Marks [id number] DOB: 01/04/1928 DOA: 04/02/2016 PCP: Kathlene November, MD   LOS: 2 days   Brief Narrative: Mikayla Marks is a 82 y.o. female with a past medical history significant for paranoid schizophrenia and HTN who presents with back pain for 4 months. The patient was in her usual state of health until around mid-February when she went on a long car ride with her daughter and developed some back pain.  By April, this back pain had gotten much worse and she had a new severe kyphosis:  Mid April 2017: Seen at [place], Dr. Mina Marble PM&R, MRI spine ordered which showed "lesions within the T8 and T9 vertebral bodies with associated pathologic fractures and osseous retropulsion...worrisome for metastatic disease, myeloma, or lymphoma".  Was referred to Oncology, did not call back. 10/26/15: Saw PCP in follow up, was recommended to see Oncology, refused. 11/11/15: Saw Pulmonology as self-referral for pleural effusions (noted incidentally on MRI of spine that showed metastasis) was recommended to see Oncology, have PET and biopsy via IR, refused 12/08/15: Saw PCP again, SPEP ordered and no M-spike (only "band of restricted mobility" in beta globulins), again referral to Hematology-Oncology and also IR that week June 2017: Saw Orthopedic surgery who recommended biopsy, family and patient declined again  Assessment & Plan: Principal Problem:   Intractable back pain Active Problems:   Schizophrenia ([place])   Essential hypertension   Cancer with unknown primary site [facility])   Prolonged QT interval   Palliative care encounter   Thoracic spine tumor   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  She is currently not an operative  candidate. - appreciate palliative input - oncology consulted, rad onc to see, awaiting input  - appreciate IR, she is s/p biopsy, pathology pending  HTN - Hypertensive at admission, suspect chronic plus pain related.  - BP within normal parameters now  Paranoid schizophrenia - Not in active treatment, have placed a psychiatry consult to see whether she would benefit from treatment at this point and also to assist on whether she has capacity to make medical decisions.  - awaiting psychiatric input  Prolonged QTc - Avoid QT prolonging agents  Social - very difficult situation as patient is under care of daughter [name] who -- based on my limited interaction -- does appear to have underlying psychiatric issues as well. It is quite clear that patient did not received adequate care in the past 6 months since her spinal tumor diagnosis. Under these circumstances she currently does not have a safe discharge plan and is not safe to go home.  - awaiting Radiation Oncology evaluation, if she needs radiation per Oncology recommendations might need to be done inpatient at [facility].   DVT prophylaxis: SCD Code Status: Partial Family Communication: discussed with Mr. [facility]) over the phone [phone]) Disposition Plan: TBD  Consultants:   Palliative  Neurosurgery  Oncology  Radiation Oncology  Psychiatry   Procedures:   IR guided biopsy 10/9  Antimicrobials:  None    Subjective: - no chest pain, shortness of breath, no abdominal pain, nausea or vomiting.  - awaiting biopsy   Objective: Vitals:   04/04/16 1142 04/04/16 1750 04/04/16 2105 04/05/16 0537  BP: 130/73 (!) 127/55 (!) 146/68 (!) 143/60  Pulse: 93 94 (!) 104 73  Resp: 15  16 16   Temp:  98.5 F (36.9 C) 98.6 F (37 C) 97.7 F (36.5 C)  TempSrc:  Oral Oral Oral  SpO2: 100% 94% 94% 95%  Weight:      Height:        Intake/Output Summary (Last 24 hours) at 04/05/16 1248 Last data filed at 04/05/16  1230  Gross per 24 hour  Intake                0 ml  Output              600 ml  Net             -600 ml   Filed Weights   04/02/16 1404 04/02/16 2311  Weight: 73 kg (161 lb) 64 kg (141 lb)    Examination: Constitutional: NAD Vitals:   04/04/16 1142 04/04/16 1750 04/04/16 2105 04/05/16 0537  BP: 130/73 (!) 127/55 (!) 146/68 (!) 143/60  Pulse: 93 94 (!) 104 73  Resp: 15  16 16   Temp:  98.5 F (36.9 C) 98.6 F (37 C) 97.7 F (36.5 C)  TempSrc:  Oral Oral Oral  SpO2: 100% 94% 94% 95%  Weight:      Height:       Respiratory: clear to auscultation bilaterally, no wheezing, no crackles.  Cardiovascular: Regular rate and rhythm, no murmurs / rubs / gallops. No LE edema.  Abdomen: no tenderness. Bowel sounds positive.  Musculoskeletal: no clubbing / cyanosis.  Skin: no rashes. Chronic venous stasis changes LE. Neurologic: non focal   Psychiatric: Normal judgment and insight. Alert and oriented x 3. Normal mood.   Data Reviewed: I have personally reviewed following labs and imaging studies  CBC:  Recent Labs Lab 04/02/16 1723  WBC 7.0  NEUTROABS 5.5  HGB 13.9  HCT 42.1  MCV 93.1  PLT [id number]   Basic Metabolic Panel:  Recent Labs Lab 04/02/16 1723  NA 138  K 3.8  CL 103  CO2 24  GLUCOSE 109*  BUN 12  CREATININE 0.87  CALCIUM 9.9   GFR: Estimated Creatinine Clearance: 38.8 mL/min (by C-G formula based on SCr of 0.87 mg/dL). Liver Function Tests:  Recent Labs Lab 04/02/16 1723  AST 22  ALT 19  ALKPHOS 55  BILITOT 0.7  PROT 7.0  ALBUMIN 4.0    Recent Labs Lab 04/02/16 1723  LIPASE 31   No results for input(s): AMMONIA in the last 168 hours. Coagulation Profile:  Recent Labs Lab 04/02/16 1723  INR 1.03   Cardiac Enzymes: No results for input(s): CKTOTAL, CKMB, CKMBINDEX, TROPONINI in the last 168 hours. BNP (last 3 results)  Recent Labs  11/11/15 1623  PROBNP 68.0   Urine analysis:    Component Value Date/Time   COLORURINE YELLOW  04/02/2016 [place] 04/02/2016 1617   LABSPEC 1.010 04/02/2016 1617   PHURINE 6.0 04/02/2016 1617   GLUCOSEU NEGATIVE 04/02/2016 1617   HGBUR TRACE (A) 04/02/2016 1617   HGBUR trace 03/04/2008 1529   BILIRUBINUR NEGATIVE 04/02/2016 1617   KETONESUR 15 (A) 04/02/2016 1617   PROTEINUR NEGATIVE 04/02/2016 1617   UROBILINOGEN 0.2 10/23/2010 0505   NITRITE NEGATIVE 04/02/2016 1617   LEUKOCYTESUR NEGATIVE 04/02/2016 1617   Sepsis Labs: Invalid input(s): PROCALCITONIN, LACTICIDVEN  Recent Results (from the past 240 hour(s))  Urine culture     Status: None   Collection Time: 04/02/16  4:18 PM  Result Value Ref Range  Status   Specimen Description URINE, CLEAN CATCH  Final   Special Requests NONE  Final   Culture NO GROWTH  Final   Report Status 04/03/2016 FINAL  Final    Radiology Studies: Ir Fluoro Guide Ndl Plmt / Bx  Result Date: 04/05/2016 INDICATION: Enlarging thoracic mass in the region of T8 and T9 vertebral bodies. EXAM: IR FLUORO GUIDE NEEDLE PLACEMENT /BIOPSY MEDICATIONS: None. ANESTHESIA/SEDATION: Moderate (conscious) sedation was employed during this procedure. A total of Versed 1 mg and Fentanyl 25 mcg was administered intravenously. Moderate Sedation Time: 15 minutes. The patient's level of consciousness and vital signs were monitored continuously by radiology nursing throughout the procedure under my direct supervision. FLUOROSCOPY TIME:  Fluoroscopy Time: 6 minutes 12 seconds 388 mGy). COMPLICATIONS: None immediate. PROCEDURE: Informed written consent was obtained from the patient after a thorough discussion of the procedural risks, benefits and alternatives. All questions were addressed. Maximal Sterile Barrier Technique was utilized including caps, mask, sterile gowns, sterile gloves, sterile drape, hand hygiene and skin antiseptic. A timeout was performed prior to the initiation of the procedure. The patient was laid prone on the fluoroscopic table. The skin  overlying the mid thoracic region was then prepped and draped in the usual sterile fashion. The skin entry site overlying the right pedicle at T9 was then infiltrated with 0.25% bupivacaine and carried to the pedicle itself. Using biplane intermittent fluoroscopy, a 13 gauge Cook spinal needle was then advanced through the destroyed right pedicle and posterior to the mid one-third at T9. Using a 16 gauge core biopsy needle and a 20 mL syringe, samples were obtained with 2 different core biopsy needles for pathologic analysis. Finally aspiration was also performed with a 20 mL syringe through the 13 gauge Cook spinal needle. Specimens obtained were sent together in the same vile for pathologic analysis. The needle was removed and hemostasis was achieved at the skin entry site. The patient tolerated the procedure well. There were no acute complications. At the end of the procedure the patient left her room in a stable condition without change in the upper or lower extremity motor function. IMPRESSION: Status post fluoro guided deep core bone biopsies at T9 for a destructive soft mass. Electronically Signed   By: Luanne Bras M.D.   On: 04/04/2016 22:19   Scheduled Meds: . cycloSPORINE  1 drop Both Eyes BID  . dexamethasone  4 mg Intravenous Q24H  . docusate sodium  100 mg Oral BID  . oxyCODONE  5 mg Oral Q6H  . polyethylene glycol  17 g Oral Daily  . senna  1 tablet Oral BID   Continuous Infusions:    Marzetta Board, MD, PhD [facility] Pager [phone]  If 7PM-7AM, please contact night-coverage [email or web address] Password [email or web address] 04/05/2016, 12:48 PM

## 2016-04-06 ENCOUNTER — Telehealth: Payer: Self-pay | Admitting: Behavioral Health

## 2016-04-06 ENCOUNTER — Ambulatory Visit: Payer: Medicare Other | Admitting: Radiation Oncology

## 2016-04-06 ENCOUNTER — Ambulatory Visit
Admission: RE | Admit: 2016-04-06 | Discharge: 2016-04-06 | Disposition: A | Discharge status: Home / Self Care | Payer: Medicare Other | Source: Ambulatory Visit | Attending: Radiation Oncology | Admitting: Radiation Oncology

## 2016-04-06 ENCOUNTER — Ambulatory Visit: Payer: Medicare Other | Admitting: Internal Medicine

## 2016-04-06 ENCOUNTER — Ambulatory Visit
Admit: 2016-04-06 | Discharge: 2016-04-06 | Disposition: A | Discharge status: Home / Self Care | Payer: Medicare Other | Attending: Radiation Oncology | Admitting: Radiation Oncology

## 2016-04-06 DIAGNOSIS — Z79899 Other long term (current) drug therapy: Secondary | ICD-10-CM

## 2016-04-06 DIAGNOSIS — Z8249 Family history of ischemic heart disease and other diseases of the circulatory system: Secondary | ICD-10-CM

## 2016-04-06 DIAGNOSIS — Z803 Family history of malignant neoplasm of breast: Secondary | ICD-10-CM

## 2016-04-06 DIAGNOSIS — Z880 Allergy status to penicillin: Secondary | ICD-10-CM

## 2016-04-06 DIAGNOSIS — R222 Localized swelling, mass and lump, trunk: Secondary | ICD-10-CM

## 2016-04-06 DIAGNOSIS — G9520 Unspecified cord compression: Secondary | ICD-10-CM | POA: Insufficient documentation

## 2016-04-06 DIAGNOSIS — M549 Dorsalgia, unspecified: Secondary | ICD-10-CM

## 2016-04-06 DIAGNOSIS — F250 Schizoaffective disorder, bipolar type: Secondary | ICD-10-CM

## 2016-04-06 DIAGNOSIS — Z801 Family history of malignant neoplasm of trachea, bronchus and lung: Secondary | ICD-10-CM

## 2016-04-06 DIAGNOSIS — C9030 Solitary plasmacytoma not having achieved remission: Principal | ICD-10-CM

## 2016-04-06 DIAGNOSIS — M546 Pain in thoracic spine: Secondary | ICD-10-CM

## 2016-04-06 DIAGNOSIS — Z510 Encounter for antineoplastic radiation therapy: Secondary | ICD-10-CM | POA: Insufficient documentation

## 2016-04-06 LAB — MULTIPLE MYELOMA PANEL, SERUM
ALBUMIN SERPL ELPH-MCNC: 4 g/dL (ref 2.9–4.4)
ALPHA 1: 0.3 g/dL (ref 0.0–0.4)
ALPHA2 GLOB SERPL ELPH-MCNC: 0.8 g/dL (ref 0.4–1.0)
Albumin/Glob SerPl: 1.5 (ref 0.7–1.7)
B-GLOBULIN SERPL ELPH-MCNC: 1.2 g/dL (ref 0.7–1.3)
GAMMA GLOB SERPL ELPH-MCNC: 0.5 g/dL (ref 0.4–1.8)
GLOBULIN, TOTAL: 2.8 g/dL (ref 2.2–3.9)
IGG (IMMUNOGLOBIN G), SERUM: 644 mg/dL — AB (ref 700–1600)
IgA: 393 mg/dL (ref 64–422)
IgM, Serum: 118 mg/dL (ref 26–217)
M PROTEIN SERPL ELPH-MCNC: 0.3 g/dL — AB
TOTAL PROTEIN ELP: 6.8 g/dL (ref 6.0–8.5)

## 2016-04-06 LAB — COMPREHENSIVE METABOLIC PANEL
ALBUMIN: 3.1 g/dL — AB (ref 3.5–5.0)
ALK PHOS: 51 U/L (ref 38–126)
ALT: 18 U/L (ref 14–54)
ANION GAP: 9 (ref 5–15)
AST: 21 U/L (ref 15–41)
BILIRUBIN TOTAL: 0.5 mg/dL (ref 0.3–1.2)
BUN: 23 mg/dL — ABNORMAL HIGH (ref 6–20)
CALCIUM: 8.9 mg/dL (ref 8.9–10.3)
CO2: 26 mmol/L (ref 22–32)
Chloride: 103 mmol/L (ref 101–111)
Creatinine, Ser: 0.88 mg/dL (ref 0.44–1.00)
GFR calc Af Amer: >60 mL/min (ref >60)
GFR, EST NON AFRICAN AMERICAN: 58 mL/min — AB (ref >60)
GLUCOSE: 120 mg/dL — AB (ref 65–99)
Potassium: 4.5 mmol/L (ref 3.5–5.1)
Sodium: 138 mmol/L (ref 135–145)
Total Protein: 5.8 g/dL — ABNORMAL LOW (ref 6.5–8.1)

## 2016-04-06 LAB — CBC
HEMATOCRIT: 38 % (ref 36.0–46.0)
HEMOGLOBIN: 12.6 g/dL (ref 12.0–15.0)
MCH: 30.3 pg (ref 26.0–34.0)
MCHC: 33.2 g/dL (ref 30.0–36.0)
MCV: 91.3 fL (ref 78.0–100.0)
Platelets: 288 10*3/uL (ref 150–400)
RBC: 4.16 MIL/uL (ref 3.87–5.11)
RDW: 14.1 % (ref 11.5–15.5)
WBC: 7.5 10*3/uL (ref 4.0–10.5)

## 2016-04-06 LAB — CBC AND DIFFERENTIAL
HEMATOCRIT: 38 % (ref 36–46)
Hemoglobin: 12.6 g/dL (ref 12.0–16.0)
PLATELETS: 288 10*3/uL (ref 150–399)
WBC: 7.5 10^3/mL

## 2016-04-06 LAB — BASIC METABOLIC PANEL
BUN: 23 mg/dL — AB (ref 4–21)
CREATININE: 0.9 mg/dL (ref 0.5–1.1)
Glucose: 120 mg/dL
POTASSIUM: 4.5 mmol/L (ref 3.4–5.3)
Sodium: 138 mmol/L (ref 137–147)

## 2016-04-06 LAB — HEPATIC FUNCTION PANEL: BILIRUBIN, TOTAL: 0.5 mg/dL

## 2016-04-06 NOTE — Progress Notes (Signed)
Patient assigned to room 1325. Spoke with Rollene Fare, RN on [address]. Explained we have completed her simulation and are preparing to transport her to the floor. Provided Rollene Fare, RN with Ander Purpura, RN direct ext [phone] to obtain report.

## 2016-04-06 NOTE — Progress Notes (Signed)
PROGRESS NOTE  Mikayla Marks [id number] DOB: 01-11-1928 DOA: 04/02/2016 PCP: Kathlene November, MD   LOS: 3 days   Brief Narrative: Mikayla Marks is a 82 y.o. female with a past medical history significant for paranoid schizophrenia and HTN who presents with back pain for 4 months. The patient was in her usual state of health until around mid-February when she went on a long car ride with her daughter and developed some back pain.  By April, this back pain had gotten much worse and she had a new severe kyphosis:  Mid April 2017: Seen at [facility], Dr. Mina Marble PM&R, MRI spine ordered which showed "lesions within the T8 and T9 vertebral bodies with associated pathologic fractures and osseous retropulsion...worrisome for metastatic disease, myeloma, or lymphoma".  Was referred to Oncology, did not call back. 10/26/15: Saw PCP in follow up, was recommended to see Oncology, refused. 11/11/15: Saw Pulmonology as self-referral for pleural effusions (noted incidentally on MRI of spine that showed metastasis) was recommended to see Oncology, have PET and biopsy via IR, refused 12/08/15: Saw PCP again, SPEP ordered and no M-spike (only "band of restricted mobility" in beta globulins), again referral to Hematology-Oncology and also IR that week June 2017: Saw Orthopedic surgery who recommended biopsy, family and patient declined again  Assessment & Plan: Principal Problem:   Intractable back pain Active Problems:   Schizophrenia ([place])   Essential hypertension   Cancer with unknown primary site [facility])   Prolonged QT interval   Palliative care encounter   Thoracic spine tumor   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  She is currently not an operative  candidate. - appreciate palliative input - oncology consulted, rad onc has seen the patient,Dr. Tammi Klippel is considering hypofractioning her treatment schedule, Dr. Tammi Klippel would like to start radiation treatment and transfer the patient to [facility] - appreciate IR, she is s/p biopsy, pathology  consistent with plasmacytoma ,  patient is being transferred to [facility] for initiation of radiation therapy Son is OB/GYN MD and [facility], he was made aware of this plan     HTN - Hypertensive at admission, suspect chronic plus pain related.  - BP within normal parameters now  Paranoid schizophrenia - Not in active treatment, have placed a psychiatry consult to see whether she would benefit from treatment at this point and also to assist on whether she has capacity to make medical decisions.  - awaiting psychiatric input  Prolonged QTc - Avoid QT prolonging agents  Social - very difficult situation as patient is under care of daughter [name] who -- based on my limited interaction -- does appear to have underlying psychiatric issues as well. It is quite clear that patient did not received adequate care in the past 6 months since her spinal tumor diagnosis. Under these circumstances she currently does not have a safe discharge plan and is not safe to go home.  - awaiting Radiation Oncology evaluation, if she needs radiation per Oncology recommendations might need to be done inpatient at [facility].   DVT prophylaxis: SCD Code Status: Partial Family Communication: discussed with Mr. Nehemiah Massed [facility]) over the phone [phone]). On 04/03/2016 patient's daughter [name], had to be escorted off the premises by security because of out-of-control behavior. She  was instructed that she could not return to the unit.  Disposition Plan:  Transfer to [facility] TREATMENT   Consultants:   Palliative  Neurosurgery  Oncology  Radiation Oncology  Psychiatry   Procedures:   IR  guided biopsy 10/9  Antimicrobials:  None    Subjective:    She seemed logical in the beginning but then  became delusional, denies any pain   Objective: Vitals:   04/05/16 0537 04/05/16 1456 04/05/16 2135 04/06/16 0542  BP: (!) 143/60 (!) 130/54 (!) 129/53 (!) 142/67  Pulse: 73 72 78 62  Resp: 16 16 18 18   Temp: 97.7 F (36.5 C) 98.1 F (36.7 C) 98.3 F (36.8 C) 98 F (36.7 C)  TempSrc: Oral Oral Oral Oral  SpO2: 95% 94% 94% 94%  Weight:      Height:        Intake/Output Summary (Last 24 hours) at 04/06/16 0944 Last data filed at 04/05/16 2130  Gross per 24 hour  Intake              120 ml  Output             1100 ml  Net             -980 ml   Filed Weights   04/02/16 1404 04/02/16 2311  Weight: 73 kg (161 lb) 64 kg (141 lb)    Examination: Constitutional: NAD Vitals:   04/05/16 0537 04/05/16 1456 04/05/16 2135 04/06/16 0542  BP: (!) 143/60 (!) 130/54 (!) 129/53 (!) 142/67  Pulse: 73 72 78 62  Resp: 16 16 18 18   Temp: 97.7 F (36.5 C) 98.1 F (36.7 C) 98.3 F (36.8 C) 98 F (36.7 C)  TempSrc: Oral Oral Oral Oral  SpO2: 95% 94% 94% 94%  Weight:      Height:       Respiratory: clear to auscultation bilaterally, no wheezing, no crackles.  Cardiovascular: Regular rate and rhythm, no murmurs / rubs / gallops. No LE edema.  Abdomen: no tenderness. Bowel sounds positive.  Musculoskeletal: no clubbing / cyanosis.  Skin: no rashes. Chronic venous stasis changes LE. Neurologic: non focal   Psychiatric: Normal judgment and insight. Alert and oriented x 3. Normal mood.   Data Reviewed: I have personally reviewed following labs and imaging studies  CBC:  Recent Labs Lab 04/02/16 1723 04/06/16 0338  WBC 7.0 7.5  NEUTROABS 5.5  --   HGB 13.9 12.6  HCT 42.1 38.0  MCV 93.1 91.3  PLT 317 [id number]   Basic Metabolic Panel:  Recent Labs Lab 04/02/16 1723 04/06/16 0338  NA 138 138  K 3.8 4.5  CL 103 103  CO2 24 26  GLUCOSE 109* 120*  BUN 12 23*   CREATININE 0.87 0.88  CALCIUM 9.9 8.9   GFR: Estimated Creatinine Clearance: 38.3 mL/min (by C-G formula based on SCr of 0.88 mg/dL). Liver Function Tests:  Recent Labs Lab 04/02/16 1723 04/06/16 0338  AST 22 21  ALT 19 18  ALKPHOS 55 51  BILITOT 0.7 0.5  PROT 7.0 5.8*  ALBUMIN 4.0 3.1*    Recent Labs Lab 04/02/16 1723  LIPASE 31   No results for input(s): AMMONIA in the last 168 hours. Coagulation Profile:  Recent Labs Lab 04/02/16 1723  INR 1.03   Cardiac Enzymes: No results for input(s): CKTOTAL, CKMB, CKMBINDEX, TROPONINI in the last 168 hours. BNP (last 3 results)  Recent Labs  11/11/15 1623  PROBNP 68.0  Urine analysis:    Component Value Date/Time   COLORURINE YELLOW 04/02/2016 [place] 04/02/2016 1617   LABSPEC 1.010 04/02/2016 1617   PHURINE 6.0 04/02/2016 1617   GLUCOSEU NEGATIVE 04/02/2016 1617   HGBUR TRACE (A) 04/02/2016 1617   HGBUR trace 03/04/2008 1529   BILIRUBINUR NEGATIVE 04/02/2016 1617   KETONESUR 15 (A) 04/02/2016 1617   PROTEINUR NEGATIVE 04/02/2016 1617   UROBILINOGEN 0.2 10/23/2010 0505   NITRITE NEGATIVE 04/02/2016 1617   LEUKOCYTESUR NEGATIVE 04/02/2016 1617   Sepsis Labs: Invalid input(s): PROCALCITONIN, LACTICIDVEN  Recent Results (from the past 240 hour(s))  Urine culture     Status: None   Collection Time: 04/02/16  4:18 PM  Result Value Ref Range Status   Specimen Description URINE, CLEAN CATCH  Final   Special Requests NONE  Final   Culture NO GROWTH  Final   Report Status 04/03/2016 FINAL  Final    Radiology Studies: Ir Fluoro Guide Ndl Plmt / Bx  Result Date: 04/05/2016 INDICATION: Enlarging thoracic mass in the region of T8 and T9 vertebral bodies. EXAM: IR FLUORO GUIDE NEEDLE PLACEMENT /BIOPSY MEDICATIONS: None. ANESTHESIA/SEDATION: Moderate (conscious) sedation was employed during this procedure. A total of Versed 1 mg and Fentanyl 25 mcg was administered intravenously. Moderate  Sedation Time: 15 minutes. The patient's level of consciousness and vital signs were monitored continuously by radiology nursing throughout the procedure under my direct supervision. FLUOROSCOPY TIME:  Fluoroscopy Time: 6 minutes 12 seconds 388 mGy). COMPLICATIONS: None immediate. PROCEDURE: Informed written consent was obtained from the patient after a thorough discussion of the procedural risks, benefits and alternatives. All questions were addressed. Maximal Sterile Barrier Technique was utilized including caps, mask, sterile gowns, sterile gloves, sterile drape, hand hygiene and skin antiseptic. A timeout was performed prior to the initiation of the procedure. The patient was laid prone on the fluoroscopic table. The skin overlying the mid thoracic region was then prepped and draped in the usual sterile fashion. The skin entry site overlying the right pedicle at T9 was then infiltrated with 0.25% bupivacaine and carried to the pedicle itself. Using biplane intermittent fluoroscopy, a 13 gauge Cook spinal needle was then advanced through the destroyed right pedicle and posterior to the mid one-third at T9. Using a 16 gauge core biopsy needle and a 20 mL syringe, samples were obtained with 2 different core biopsy needles for pathologic analysis. Finally aspiration was also performed with a 20 mL syringe through the 13 gauge Cook spinal needle. Specimens obtained were sent together in the same vile for pathologic analysis. The needle was removed and hemostasis was achieved at the skin entry site. The patient tolerated the procedure well. There were no acute complications. At the end of the procedure the patient left her room in a stable condition without change in the upper or lower extremity motor function. IMPRESSION: Status post fluoro guided deep core bone biopsies at T9 for a destructive soft mass. Electronically Signed   By: Luanne Bras M.D.   On: 04/04/2016 22:19   Scheduled Meds: . cycloSPORINE  1  drop Both Eyes BID  . dexamethasone  4 mg Intravenous Q24H  . docusate sodium  100 mg Oral BID  . oxyCODONE  5 mg Oral Q6H  . polyethylene glycol  17 g Oral Daily  . senna  1 tablet Oral BID   Continuous Infusions:      [facility] Pager [phone]  If 7PM-7AM, please contact night-coverage [email or web address] Password [facility] 04/06/2016, 9:44  AM

## 2016-04-06 NOTE — Consult Note (Signed)
            [facility] CM Primary Care Navigator  04/06/2016  Mikayla Marks 1928/06/24 [id number]  Went to see patient at the bedside to identify possible discharge needs but nursing staff states patient was transferred to [facility] for radiation treatment.   Primary Care provider's office called and notified Arminda Resides, RN) for need to follow-up for transition of care when discharged.  For additional questions please contact:  Edwena Felty A. Dajia Gunnels, BSN, RN-BC [facility] PRIMARY CARE Navigator Cell: [phone]

## 2016-04-06 NOTE — Progress Notes (Signed)
Physical Therapy Treatment Patient Details Name: Mikayla Marks MRN: [id number] DOB: [birth year] Today's Date: 04/06/2016    History of Present Illness 82 y.o. female admitted with back pain and now found to have thoracic spine bone mass T7-10. To undergo biopsy. PMH: schizoaffective disorder, carcinoma, osteoporosis, HTN.    PT Comments    Pt able to increase her activity tolerance by ambulating 50 ft with rw and min guard assistance. Pt did have 1 loss of balance with min assist to recover. Following session, pt denies any increased pain and is very appreciative of being able to walk. No family present to discuss discharge recommendations at this time. PT to continue to follow and progress mobility as tolerated. PT recommending 24 hour supervision at D/C.   Follow Up Recommendations  Home health PT;Supervision/Assistance - 24 hour     Equipment Recommendations  None recommended by PT    Recommendations for Other Services       Precautions / Restrictions Precautions Precautions: Fall Restrictions Weight Bearing Restrictions: No    Mobility  Bed Mobility Overal bed mobility: Needs Assistance Bed Mobility: Supine to Sit;Sit to Supine     Supine to sit: Mod assist Sit to supine: Mod assist   General bed mobility comments: Mod assist with LEs and trunk to come to sitting with modified logroll as pt having difficulty rolling onto Lt side. Assist with LEs to return to supine.   Transfers Overall transfer level: Needs assistance Equipment used: Rolling walker (2 wheeled) Transfers: Sit to/from Stand Sit to Stand: Min assist         General transfer comment: mild instability initially but improving quickly.   Ambulation/Gait Ambulation/Gait assistance: Min guard Ambulation Distance (Feet): 50 Feet Assistive device: Rolling walker (2 wheeled) Gait Pattern/deviations: Step-through pattern;Decreased step length - right;Decreased step length - left;Trunk flexed Gait  velocity: slow pattern   General Gait Details: Pt refusing ambulation in halls, able to walk in room. Pt with 1 loss of balance to Rt side with min assist to recover.    Stairs            Wheelchair Mobility    Modified Rankin (Stroke Patients Only)       Balance Overall balance assessment: Needs assistance Sitting-balance support: No upper extremity supported Sitting balance-Leahy Scale: Fair     Standing balance support: Bilateral upper extremity supported Standing balance-Leahy Scale: Poor Standing balance comment: using rw for support                    Cognition Arousal/Alertness: Awake/alert Behavior During Therapy: WFL for tasks assessed/performed Overall Cognitive Status: No family/caregiver present to determine baseline cognitive functioning                      Exercises      General Comments General comments (skin integrity, edema, etc.): No family present to discuss discharge recommendations.       Pertinent Vitals/Pain Pain Assessment: Faces Faces Pain Scale: Hurts a little bit Pain Location: back Pain Descriptors / Indicators: Guarding Pain Intervention(s): Limited activity within patient's tolerance;Monitored during session    Home Living                      Prior Function            PT Goals (current goals can now be found in the care plan section) Acute Rehab PT Goals Patient Stated Goal: be able to visit her neighbors again  PT Goal Formulation: With patient Time For Goal Achievement: 04/18/16 Potential to Achieve Goals: Good Progress towards PT goals: Progressing toward goals    Frequency    Min 3X/week      PT Plan Current plan remains appropriate    Co-evaluation             End of Session Equipment Utilized During Treatment: Gait belt Activity Tolerance: Patient tolerated treatment well (denies any increased pain. ) Patient left: in bed;with call bell/phone within reach;with SCD's  reapplied     Time: 0932-0958 PT Time Calculation (min) (ACUTE ONLY): 26 min  Charges:  $Gait Training: 23-37 mins                    G Codes:      Cassell Clement, PT, CSCS Pager [phone] Office [phone]  04/06/2016, 10:07 AM

## 2016-04-06 NOTE — Progress Notes (Signed)
Late note:  Asked to speak with patient's daughter [name] who had been aggressive with bedside nurse. [name] had been escorted off the unit by security on Sunday 10/8 and returned on 10/9.  Leadership contacted security to be on standby when speaking with patient and daughter. Leadership asked the daughter to step out of the room to speak with patient in private. Patient stated daughter got loud and irate on yesterday and she did wish for the daughter to leave so they both could get rest. Daughter came back in room with security present and patient stated in their presence that she wanted to daughter to leave. The daughter was resistant at first stating that she cared for her mother outside of the hospital and needed to know everything going on with her care. The daughter stated her uncle did not want her to receive any information. The patient confirmed that the brother is her POA.

## 2016-04-06 NOTE — Progress Notes (Signed)
Attempting to coordinate transportation for 0900 simulation. Unfortunately, both [employer] trucks are allocated thus sim has been moved down to 1100. Coordinated [employer] transport with [employer]. Lauren, RN reports the patient is stable with a patent IV. She denies that the patient is under any precautions. She confirms the patient's pain is managed well with oxy IR. Shona Simpson, PA informed of these findings and changes. Left message for patient's brother, Nehemiah Massed, that his sister's sim has been moved down to 1100 due to transportation.

## 2016-04-06 NOTE — Progress Notes (Addendum)
Daily Progress Note   Patient Name: Mikayla Marks       Date: 04/05/2016 DOB: 1928/06/05  Age: 82 y.o. MRN#: [id number] Attending Physician: Reyne Dumas, MD Primary Care Physician: Kathlene November, MD Admit Date: 04/02/2016  Reason for Consultation/Follow-up: Pain control  Subjective: Patient denies pain but has significant difficulty changing position.  Reports she had a bowel movement yesterday.  We talked about her home.  She seemed logical in the beginning but then started to talk about people shooting in her windows and throwing in bombs with nuclear powder that burned her skin.    Her biggest worry is her daughter's care.  She is open to ALF or SNF but concerned because she can't take her daughter there.  Son is OB/GYN MD and HCPOA.  He is doing his best to care for the two of them.  Length of Stay: 3  Current Medications: Scheduled Meds:  . cycloSPORINE  1 drop Both Eyes BID  . dexamethasone  4 mg Intravenous Q24H  . docusate sodium  100 mg Oral BID  . oxyCODONE  5 mg Oral Q6H  . polyethylene glycol  17 g Oral Daily  . senna  1 tablet Oral BID    Continuous Infusions:    PRN Meds: acetaminophen **OR** acetaminophen, methocarbamol, morphine injection, oxyCODONE  Physical Exam      Bright female, appears younger than stated age.  Speech/presentation unusually upbeat.  Rambling a bit. CV rrr Resp NAD Abdomen still firm (imaging shows lots of stool) Ext able to move      Vital Signs: BP (!) 142/67 (BP Location: Left Arm)   Pulse 62   Temp 98 F (36.7 C) (Oral)   Resp 18   Ht 5' (1.524 m)   Wt 64 kg (141 lb)   SpO2 94%   BMI 27.54 kg/m  SpO2: SpO2: 94 % O2 Device: O2 Device: Not Delivered O2 Flow Rate: O2 Flow Rate (L/min): 2 L/min  General: elderly woman  sitting up in bed, appears comfortable, pleasant and talkative HEENT: [place]/AT, EOMI, sclera anicteric, mucus membranes moist Abd: BS+, mildly distended but soft and non-tender  Neuro: alert and oriented x 3, speech is pressured.   Intake/output summary:   Intake/Output Summary (Last 24 hours) at 04/06/16 0757 Last data filed at 04/05/16 2130  Gross per  24 hour  Intake              120 ml  Output             1100 ml  Net             -980 ml   LBM: Last BM Date: 04/05/16 Baseline Weight: Weight: 73 kg (161 lb) Most recent weight: Weight: 64 kg (141 lb)       Palliative Assessment/Data:    Flowsheet Rows   Flowsheet Row Most Recent Value  Intake Tab  Referral Department  Hospitalist  Unit at Time of Referral  Med/Surg Unit  Palliative Care Primary Diagnosis  Cancer  Date Notified  04/02/16  Palliative Care Type  New Palliative care  Reason for referral  Pain, Clarify Goals of Care, Psychosocial or Spiritual support  Date of Admission  04/02/16  Date first seen by Palliative Care  04/03/16  # of days Palliative referral response time  1 Day(s)  # of days IP prior to Palliative referral  0  Clinical Assessment  Palliative Performance Scale Score  40%  Pain Max last 24 hours  10  Pain Min Last 24 hours  3  Dyspnea Max Last 24 Hours  0  Dyspnea Min Last 24 hours  0  Nausea Max Last 24 Hours  0  Nausea Min Last 24 Hours  0  Anxiety Max Last 24 Hours  0  Anxiety Min Last 24 Hours  0  Psychosocial & Spiritual Assessment  Palliative Care Outcomes  Patient/Family meeting held?  Yes  Who was at the meeting?  son, brother, DIL  Palliative Care Outcomes  Improved pain interventions, Clarified goals of care, Changed CPR status, Provided psychosocial or spiritual support  Patient/Family wishes: Interventions discontinued/not started   Mechanical Ventilation, BiPAP, Trach, NIPPV  Palliative Care follow-up planned  Yes, Facility      Patient Active Problem List   Diagnosis Date  Noted  . Thoracic spine tumor   . Prolonged QT interval 04/03/2016  . Palliative care encounter   . Intractable back pain 04/02/2016  . Cancer with unknown primary site ([place]) 04/02/2016  . Pathologic vertebral fracture 11/12/2015  . Dyspnea 11/11/2015  . Pleural effusion, bilateral 11/11/2015  . PCP NOTES >>>>>>>>>>>>>>>>>>>>>>>>>>>>>>>> 10/26/2015  . Hammer toe of left foot 01/30/2015  . Bilateral leg edema 09/10/2014  . Onychomycosis 05/19/2014  . Pain in lower limb 05/19/2014  . Fatigue 02/11/2014  . Medicare annual wellness visit, subsequent 09/02/2013  . Encounter for general adult medical examination without abnormal findings 09/02/2013  . Pain in joint, foot, left 07/10/2013  . Corn of toe 02/04/2013  . Varicose veins of lower extremities with inflammation 11/20/2012  . Varicose veins of lower extremities with other complications [id number]  . DEGENERATIVE JOINT DISEASE 01/12/2010  . Schizophrenia ([place]) 06/30/2009  . SKIN CANCER, LEG 05/29/2009  . Cancer of skin of leg 05/29/2009  . OSTEOPOROSIS 08/26/2008  . HYPERLIPIDEMIA 10/16/2006  . Essential hypertension 10/16/2006    Palliative Care Assessment & Plan   Patient Profile: 82 yo woman with past medical history of Paranoid schizophrenia, hypertension, prolonged QT interval, severe back pain starting in April 2017 admitted on 04/02/2016 with severe back pain. Per chart review, MRI of spine in April 2017 showed "lesions within the T8 and T9 vertebral bodies with associated pathologic fractures and osseous retropulsion worrisome for metastatic disease, myeloma, or lymphoma"'s. She was referred to oncology at that time but did not follow-up. On 10/26/2015 she  saw her PCP because of ongoing pain and again was referred to oncology which she refused. On 11/11/2015 she saw pulmonary for pleural effusions and was additionally referred to oncology, as well as to have a PET scan and biopsy of mass seen on T8 and T9. Patient again  refused. On 12/08/2015 she again saw her PCP and was for referred to hematology and oncology and she declined. Repeat CT scan on 10/7 shows destructive spinal mass at T8 and T9 but also involving T7 and T10. This mass is causing severe central stenosis and narrowing; also pathological fracture of right first rib. In April of this year MRI did not show any evidence of spinal cord compression. Patient is still able to walk, but is standing in a bent position because of severe pain. In speaking with her family her schizophrenia as well as her daughter's mental illness (with whom she lives) has directly impacted her ability to follow through with outpatient workup for work up, what is likely a cancerous mass. Patient as well as her daughter share paranoid delusions regarding toxic fumes, nuclear waste, thinking that [facility] is not a real  hospitals, and people who are identifying themselves as physicians are imposters. On 04/03/2016 patient's daughter [name], had to be escorted off the premises by security because of out-of-control behavior. She was instructed that she could not return to the unit.    Assessment: Biopsy complete.  Path pending.  Pain controlled.  Patient probably still needs to have more bowel movements.  DNR established.  Recommendations/Plan:  Decadron 4 mg IV daily for bone pain.  Monitor for mania.  Oncology to follow  Oxycodone 5 mg PO Q4H PRN and Oxycodone 5 mg Q6H scheduled.  Appears effective.   Miralax scheduled daily. Continue Senokot 1 tab BID.   Consider psych social work consultation for help with daughter and patient.  Goals of Care and Additional Recommendations:  Limitations on Scope of Treatment: No Surgical Procedures and No Tracheostomy  Code Status: DNR    Prognosis:   Unable to determine  Discharge Planning:  [place] for rehab with Palliative care service follow-up  Care plan was discussed with patient   Thank you for  allowing the Palliative Medicine Team to assist in the care of this patient.  Palliative Medicine will sign off for now but follow the chart peripherally.  Please call us back if needed.   Time In: 10:00AM Time Out: 10:25AM Total Time 25 mins Prolonged Time Billed  no       Greater than 50%  of this time was spent counseling and coordinating care related to the above assessment and plan.   Imogene Burn, PA-C Palliative Medicine  Please contact Palliative Medicine Team phone at [phone] for questions and concerns.

## 2016-04-06 NOTE — Progress Notes (Signed)
Report given to nurse at [facility]. No questions/complaints.

## 2016-04-06 NOTE — Progress Notes (Signed)
Patient ID: Mikayla Marks, female   DOB: 02-24-1928, 82 y.o.   MRN: [id number]  Received psych consult for capacity evaluation, and than found she was transferred from [facility] hospital to [facility] this afternoon. Psychiatry consultation will follow up with patient in am.   [facility] 04/06/2016 4:26 PM

## 2016-04-06 NOTE — Consult Note (Signed)
[facility] Face-to-Face Psychiatry Consult   Reason for Consult:  Chronic psychosis and capacity evaluation Referring Physician:  Dr. Karleen Hampshire Patient Identification: Mikayla Marks MRN:  [id number] Principal Diagnosis: Intractable back pain Diagnosis:   Patient Active Problem List   Diagnosis Date Noted  . Thoracic spine tumor [D49.2]   . Prolonged QT interval [R94.31] 04/03/2016  . Palliative care encounter [Z51.5]   . Intractable back pain [M54.9] 04/02/2016  . Cancer with unknown primary site [facility] [C80.1] 04/02/2016  . Pathologic vertebral fracture [V37.10GY] 11/12/2015  . Dyspnea [R06.00] 11/11/2015  . Pleural effusion, bilateral [J90] 11/11/2015  . PCP NOTES >>>>>>>>>>>>>>>>>>>>>>>>>>>>>>>> [Z09] 10/26/2015  . Hammer toe of left foot [M20.42] 01/30/2015  . Bilateral leg edema [R60.0] 09/10/2014  . Onychomycosis [B35.1] 05/19/2014  . Pain in lower limb [M79.606] 05/19/2014  . Fatigue [R53.83] 02/11/2014  . Medicare annual wellness visit, subsequent [Z00.00] 09/02/2013  . Encounter for general adult medical examination without abnormal findings [Z00.00] 09/02/2013  . Pain in joint, foot, left [M25.572] 07/10/2013  . Corn of toe [L84] 02/04/2013  . Varicose veins of lower extremities with inflammation [I83.10] 11/20/2012  . Varicose veins of lower extremities with other complications [I94.854] 62/70/3500  . DEGENERATIVE JOINT DISEASE [M19.90] 01/12/2010  . Schizophrenia ([place]) [F20.9] 06/30/2009  . SKIN CANCER, LEG [C44.791] 05/29/2009  . Cancer of skin of leg [C44.701] 05/29/2009  . OSTEOPOROSIS [M81.0] 08/26/2008  . HYPERLIPIDEMIA [E78.5] 10/16/2006  . Essential hypertension [I10] 10/16/2006    Total Time spent with patient: 1 hour  Subjective:   Mikayla Marks is a 82 y.o. female patient admitted with uncontrollable back pain.  HPI:  Mikayla Marks a 82 y.o.femaleseen, chart reviewed for this face-to-face psychiatry consultation and evaluation along with the  psychiatric social service. Patient appeared lying in her bed, awake, and alert, oriented to herself, for be in hospital, current year and also elevated off for medical condition is a melanoma with the spread to the bones. Patient reportedly receiving chemotherapy fifth as recommended. Reportedly patient does not benefit from surgery. Patient reported she has multiple family members who has been in medicine and practicing in different states. Patient denies symptoms of depression, anxiety, mania, suicidal/homicidal ideation, intention or plans. Patient was seen with the pressured speech, racing thoughts, tangential and circumstantial thought process. Patient needed frequent redirections during my evaluation. Patient has no family members at bedside will ask psychiatric social service to contact family members regarding collateral information about mental health treatment needs.  Medical history: Patient with a past medical history significant for paranoid schizophrenia and HTNwho presents with back pain for 4 months. The patient was in her usual state of health until around mid-February when she went on a long car ride with her daughter and developed some back pain. By April, this back pain had gotten much worse and she had a new severe kyphosis:  Mid April 2017: Seen at [facility], Dr. Mina Marble PM&R, MRI spine ordered which showed "lesions within the T8 and T9 vertebral bodies with associated pathologic fractures and osseous retropulsion...worrisome for metastatic disease, myeloma, or lymphoma". Was referred to Oncology, did not call back. 10/26/15: Saw PCP in follow up, was recommended to see Oncology, refused. 11/11/15: Saw Pulmonology as self-referral for pleural effusions (noted incidentally on MRI of spine that showed metastasis) was recommended to see Oncology, have PET and biopsy via IR, refused 12/08/15: Saw PCP again, SPEP ordered and no M-spike (only "band of restricted mobility" in beta  globulins), again referral to Hematology-Oncology and also  IR that week June 2017: Saw Orthopedic surgery who recommended biopsy, family and patient declined again  Past Psychiatric History: Patient has significant history of schizoaffective disorder and last admission to [facility] 2008. Patient has no current psychiatric services.  Risk to Self: Is patient at risk for suicide?: No Risk to Others:   Prior Inpatient Therapy:   Prior Outpatient Therapy:    Past Medical History:  Past Medical History:  Diagnosis Date  . Hyperlipidemia   . HYPERTENSION   . Osteoporosis   . SCC (squamous cell carcinoma)    in situ R pretibial area  . Schizoaffective disorder [facility])     Past Surgical History:  Procedure Laterality Date  . BREAST BIOPSY     benign tumor  . CATARACT EXTRACTION    . CHOLECYSTECTOMY    . IR GENERIC HISTORICAL  04/04/2016   IR FLUORO GUIDED NEEDLE PLC ASPIRATION/INJECTION LOC 04/04/2016 Luanne Bras, MD [facility]-INTERV RAD  . LASER ABLATION Left    left LE   . LEG SURGERY Right 10/2012   Ruptured Veins  . PARTIAL HYSTERECTOMY     due to bleeding  . TONSILLECTOMY     Family History:  Family History  Problem Relation Age of Onset  . Heart Problems Mother   . Colon cancer Neg Hx   . Breast cancer Neg Hx   . CAD Neg Hx    Family Psychiatric  History: Patient denied family history of mental Social History:  History  Alcohol Use No     History  Drug Use No    Social History   Social History  . Marital status: Single    Spouse name: N/A  . Number of children: 3  . Years of education: N/A   Occupational History  . not working    Social History Main Topics  . Smoking status: Never Smoker  . Smokeless tobacco: Never Used  . Alcohol use No  . Drug use: No  . Sexual activity: No   Other Topics Concern  . None   Social History Narrative   Daughter lives with her [name])     still drives   widow   Brother [name] in [place], lives 5 min away [phone]          Additional Social History:    Allergies:   Allergies  Allergen Reactions  . Other Other (See Comments)    5FU cream - stroke like symptoms  . Bacitracin-Polymyxin B Other (See Comments)    Reaction unknown.   Marland Kitchen Penicillins Rash    Labs:  Results for orders placed or performed during the hospital encounter of 04/02/16 (from the past 48 hour(s))  CBC     Status: None   Collection Time: 04/06/16  3:38 AM  Result Value Ref Range   WBC 7.5 4.0 - 10.5 K/uL   RBC 4.16 3.87 - 5.11 MIL/uL   Hemoglobin 12.6 12.0 - 15.0 g/dL   HCT 38.0 36.0 - 46.0 %   MCV 91.3 78.0 - 100.0 fL   MCH 30.3 26.0 - 34.0 pg   MCHC 33.2 30.0 - 36.0 g/dL   RDW 14.1 11.5 - 15.5 %   Platelets 288 150 - 400 K/uL  Comprehensive metabolic panel     Status: Abnormal   Collection Time: 04/06/16  3:38 AM  Result Value Ref Range   Sodium 138 135 - 145 mmol/L   Potassium 4.5 3.5 - 5.1 mmol/L   Chloride 103 101 - 111 mmol/L   CO2  26 22 - 32 mmol/L   Glucose, Bld 120 (H) 65 - 99 mg/dL   BUN 23 (H) 6 - 20 mg/dL   Creatinine, Ser 0.88 0.44 - 1.00 mg/dL   Calcium 8.9 8.9 - 10.3 mg/dL   Total Protein 5.8 (L) 6.5 - 8.1 g/dL   Albumin 3.1 (L) 3.5 - 5.0 g/dL   AST 21 15 - 41 U/L   ALT 18 14 - 54 U/L   Alkaline Phosphatase 51 38 - 126 U/L   Total Bilirubin 0.5 0.3 - 1.2 mg/dL   GFR calc non Af Amer 58 (L) >60 mL/min   GFR calc Af Amer >60 >60 mL/min    Comment: (NOTE) The eGFR has been calculated using the CKD EPI equation. This calculation has not been validated in all clinical situations. eGFR's persistently <60 mL/min signify possible Chronic Kidney Disease.    Anion gap 9 5 - 15    Current Facility-Administered Medications  Medication Dose Route Frequency Provider Last Rate Last Dose  . acetaminophen (TYLENOL) tablet 650 mg  650 mg Oral Q6H PRN Edwin Dada, MD       Or  . acetaminophen (TYLENOL) suppository 650 mg  650 mg Rectal Q6H PRN Edwin Dada, MD      . cycloSPORINE (RESTASIS)  0.05 % ophthalmic emulsion 1 drop  1 drop Both Eyes BID Caren Griffins, MD   1 drop at 04/05/16 2129  . dexamethasone (DECADRON) injection 4 mg  4 mg Intravenous Q24H Melton Alar, PA-C   4 mg at 04/05/16 1549  . docusate sodium (COLACE) capsule 100 mg  100 mg Oral BID Edwin Dada, MD   100 mg at 04/05/16 2129  . methocarbamol (ROBAXIN) tablet 500 mg  500 mg Oral Q8H PRN Caren Griffins, MD   500 mg at 04/04/16 0802  . oxyCODONE (Oxy IR/ROXICODONE) immediate release tablet 5 mg  5 mg Oral Q4H PRN Edwin Dada, MD   5 mg at 04/03/16 0700  . oxyCODONE (Oxy IR/ROXICODONE) immediate release tablet 5 mg  5 mg Oral Q6H Dory Horn, NP   5 mg at 04/06/16 0606  . polyethylene glycol (MIRALAX / GLYCOLAX) packet 17 g  17 g Oral Daily Juliet Rude, MD   17 g at 04/05/16 0940  . senna (SENOKOT) tablet 8.6 mg  1 tablet Oral BID Edwin Dada, MD   8.6 mg at 04/05/16 2130    Musculoskeletal: Strength & Muscle Tone: decreased Gait & Station: unable to stand Patient leans: N/A  Psychiatric Specialty Exam: Physical Exam as per history and physical   ROS patient complaining about low back pain but denied nausea, vomiting, abdomen pain and shortness of breath. No Fever-chills, No Headache, No changes with Vision or hearing, reports vertigo No problems swallowing food or Liquids, No Chest pain, Cough or Shortness of Breath, No Abdominal pain, No Nausea or Vommitting, Bowel movements are regular, No Blood in stool or Urine, No dysuria, No new skin rashes or bruises, No new joints pains-aches,  No new weakness, tingling, numbness in any extremity, No recent weight gain or loss, No polyuria, polydypsia or polyphagia,  A full 10 point Review of Systems was done, except as stated above, all other Review of Systems were negative.  Blood pressure (!) 142/67, pulse 62, temperature 98 F (36.7 C), temperature source Oral, resp. rate 18, height 5' (1.524 m), weight 64 kg  (141 lb), SpO2 94 %.Body mass index is 27.54 kg/m.  General Appearance: Casual  Eye Contact:  Good  Speech:  Pressured  Volume:  Increased  Mood:  Euphoric  Affect:  Labile  Thought Process:  Irrelevant  Orientation:  Full (Time, Place, and Person)  Thought Content:  Logical  Suicidal Thoughts:  No  Homicidal Thoughts:  No  Memory:  Immediate;   Good Recent;   Fair Remote;   Fair  Judgement:  Fair  Insight:  Fair  Psychomotor Activity:  Increased  Concentration:  Concentration: Fair and Attention Span: Fair  Recall:  [employer] of Knowledge:  Good  Language:  Good  Akathisia:  Negative  Handed:  Right  AIMS (if indicated):     Assets:  Communication Skills Desire for Improvement Housing Leisure Time  ADL's:  Impaired  Cognition:  WNL  Sleep:        Treatment Plan Summary: This is 82 years old female initially admitted to [facility] and later transferred to the [facility] for treatment of melanoma. Reportedly patient has history of schizoaffective disorder but not clear if she has been receiving any treatment as outpatient. Patient denies current symptoms of depression, anxiety and paranoid delusions, hallucinations but presented with pressured speech, racing thoughts, tangential and circumstantial thought process. Patient has no active suicidal/homicidal ideation, intention or plans. Reportedly patient has a prolonged QT interval which is a contraindication for most of the antipsychotic medications.  Based on my evaluation patient has capacity to make her own medical decisions and living arrangements and she has fair orientation, memory and language functions. Patient is completely knows that she has been diagnosed with melanoma when she needed chemotherapy and she has been cooperative with the staff.  Case discussed with the psychiatric social service and Recommend gabapentin 100 mg 2 times daily for mood swings  Appreciate psychiatric consultation  and follow up as clinically required Please contact [phone] or [phone] if needs further assistance   Disposition: No evidence of imminent risk to self or others at present.   Patient does not meet criteria for psychiatric inpatient admission. Supportive therapy provided about ongoing stressors.  Ambrose Finland, MD 04/06/2016 12:30 PM

## 2016-04-06 NOTE — Telephone Encounter (Signed)
Griffin Basil, RN, Primary Care Navigator at [facility] would like to make PCP aware that the patient is being transferred to [facility] for radiation treatment and will need a follow-up visit once discharged.

## 2016-04-06 NOTE — Progress Notes (Signed)
Report given to [facility]. 

## 2016-04-06 NOTE — Progress Notes (Addendum)
  Radiation Oncology         [phone] ________________________________  Name: Mikayla Marks MRN: [id number]  Date: 04/06/2016  DOB: [birth year]   INPATIENT   SIMULATION AND TREATMENT PLANNING NOTE    ICD-9-CM ICD-10-CM   1. Plasmacytoma (HCC) 238.6 C90.30   2. 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  Identity was confirmed.  All relevant records and images related to the planned course of therapy were reviewed.  The patient freely provided informed written consent to proceed with treatment after reviewing the details related to the planned course of therapy. The consent form was witnessed and verified by the simulation staff.  Then, the patient was set-up in a stable reproducible  supine position for radiation therapy.  CT images were obtained.  Surface markings were placed.  The CT images were loaded into the planning software.  Then the target and avoidance structures were contoured.  Treatment planning then occurred.  The radiation prescription was entered and confirmed.  Then, I designed and supervised the construction of a total of 2 medically necessary complex treatment devices.  I have requested : Isodose Plan.  I have ordered:Nutrition Consult  PLAN:  The patient will receive 20 Gy in 10 fraction.  ________________________________  Sheral Apley Tammi Klippel, M.D.

## 2016-04-06 NOTE — Progress Notes (Signed)
Pt. Transferred to [facility] for Radiation Treatment. Will return after treatment.

## 2016-04-06 NOTE — Telephone Encounter (Signed)
thx

## 2016-04-06 NOTE — Progress Notes (Signed)
Mikayla Marks   DOB:01-18-1928   YK#:[id number]   BLT#:[id number]  Subjective: Pt was transferred to [facility] today to start spinal radiation. She states her pain is much improved, she feels better overall. She is very appreciative due to care she has been receiving here.    Objective:  Vitals:   04/06/16 0542 04/06/16 1300  BP: (!) 142/67 (!) 159/67  Pulse: 62 86  Resp: 18 16  Temp: 98 F (36.7 C) 98.1 F (36.7 C)    Body mass index is 27.54 kg/m.  Intake/Output Summary (Last 24 hours) at 04/06/16 2023 Last data filed at 04/05/16 2130  Gross per 24 hour  Intake                0 ml  Output              500 ml  Net             -500 ml     Sclerae unicteric  Oropharynx clear  No peripheral adenopathy  Lungs clear -- no rales or rhonchi  Heart regular rate and rhythm  Abdomen benign  MSK: mild mid thoracic spine tenderness, no peripheral edema  Neuro nonfocal    CBG (last 3)  No results for input(s): GLUCAP in the last 72 hours.   Labs:  Lab Results  Component Value Date   WBC 7.5 04/06/2016   HGB 12.6 04/06/2016   HCT 38.0 04/06/2016   MCV 91.3 04/06/2016   PLT 288 04/06/2016   NEUTROABS 5.5 04/02/2016   CMP Latest Ref Rng & Units 04/06/2016 04/02/2016 11/11/2015  Glucose 65 - 99 mg/dL 120(H) 109(H) 105(H)  BUN 6 - 20 mg/dL 23(H) 12 18  Creatinine 0.44 - 1.00 mg/dL 0.88 0.87 0.93  Sodium 135 - 145 mmol/L 138 138 140  Potassium 3.5 - 5.1 mmol/L 4.5 3.8 3.6  Chloride 101 - 111 mmol/L 103 103 103  CO2 22 - 32 mmol/L _0 Calcium 8.9 - 10.3 mg/dL 8.9 9.9 9.6  Total Protein 6.5 - 8.1 g/dL 5.8(L) 7.0 7.4  Total Bilirubin 0.3 - 1.2 mg/dL 0.5 0.7 0.5  Alkaline Phos 38 - 126 U/L 51 55 80  AST 15 - 41 U/L _1 ALT 14 - 54 U/L _2 Urine Studies No results for input(s): UHGB, CRYS in the last 72 hours.  Invalid input(s): UACOL, UAPR, USPG, UPH, UTP, UGL, UKET, UBIL, UNIT, UROB, ULEU, UEPI, UWBC, URBC, UBAC, CAST, UCOM, BILUA  Basic Metabolic  Panel:  Recent Labs Lab 04/02/16 1723 04/06/16 0338  NA 138 138  K 3.8 4.5  CL 103 103  CO2 24 26  GLUCOSE 109* 120*  BUN 12 23*  CREATININE 0.87 0.88  CALCIUM 9.9 8.9   GFR Estimated Creatinine Clearance: 38.3 mL/min (by C-G formula based on SCr of 0.88 mg/dL). Liver Function Tests:  Recent Labs Lab 04/02/16 1723 04/06/16 0338  AST 22 21  ALT 19 18  ALKPHOS 55 51  BILITOT 0.7 0.5  PROT 7.0 5.8*  ALBUMIN 4.0 3.1*    Recent Labs Lab 04/02/16 1723  LIPASE 31   No results for input(s): AMMONIA in the last 168 hours. Coagulation profile  Recent Labs Lab 04/02/16 1723  INR 1.03    CBC:  Recent Labs Lab 04/02/16 1723 04/06/16 0338  WBC 7.0 7.5  NEUTROABS 5.5  --   HGB 13.9 12.6  HCT 42.1 38.0  MCV 93.1 91.3  PLT 317  288   Cardiac Enzymes: No results for input(s): CKTOTAL, CKMB, CKMBINDEX, TROPONINI in the last 168 hours. BNP: Invalid input(s): POCBNP CBG: No results for input(s): GLUCAP in the last 168 hours. D-Dimer No results for input(s): DDIMER in the last 72 hours. Hgb A1c No results for input(s): HGBA1C in the last 72 hours. Lipid Profile No results for input(s): CHOL, HDL, LDLCALC, TRIG, CHOLHDL, LDLDIRECT in the last 72 hours. Thyroid function studies No results for input(s): TSH, T4TOTAL, T3FREE, THYROIDAB in the last 72 hours.  Invalid input(s): FREET3 Anemia work up No results for input(s): VITAMINB12, FOLATE, FERRITIN, TIBC, IRON, RETICCTPCT in the last 72 hours. Microbiology Recent Results (from the past 240 hour(s))  Urine culture     Status: None   Collection Time: 04/02/16  4:18 PM  Result Value Ref Range Status   Specimen Description URINE, CLEAN CATCH  Final   Special Requests NONE  Final   Culture NO GROWTH  Final   Report Status 04/03/2016 FINAL  Final   PATHOLOGY REPORT Diagnosis 04/04/2016 Bone, biopsy, Thoracic 9 - PLASMA CELL NEOPLASM, SEE COMMENT. Microscopic Comment The bone biopsy reveals sheets of atypical  plasma cells, which are positive for CD138. By light chain in situ hybridization the plasma cells are kappa restricted. The case was called to Dr.  on 04/06/2016.  Studies:  No results found.  Assessment: 82 y.o. with a past medical history significant for paranoid schizophrenia and HTNwho presents with worsening back pain for one year, much worse for the past 6 months.   1. 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 -Her SPEP, immunofixation was negative for definitive M-protein. Serum light chain showed elevated Kappa, normal lamda. -No other clinical evidence of multiple myeloma, including hypercalcemia, anemia, renal failure.  -I recommend a bone marrow biopsy, if negative, then patient then has solitary plasmacytoma of bone, which can be treated with radiation alone and possible curable with definitive dose of radiation, no systemic therapy needed.  -If patient does have significant plasma cell in her bone marrow, then likely she has multiple myeloma, and radiation along to the plasmacytoma would be palliative, not curative,  and the patient would benefit from systemic therapy for disease control. -Due to patient's advanced age and multiple comorbidities, especially her schizophrenia and social situation, she is not a candidate for intensive chemotherapy. She would not be a candidate for bone marrow transplant if she has multiple myeloma. However I think she could tolerate less intensive chemo, such as low dose oral Revlimid alone. -As such, I recommend patient to have a bone marrow biopsy. I discussed the above with patient's son, Dr. Amaker, who agrees. He also cleared expressed the overall palliative approach towards his month's cancer care, which I compared  agree. -We'll also collect 24-hour urine for UPEP, immunofixation and light chain  2. Schizophrenia 3. Hypertension  Plan:  -Please arrange bone marrow biopsy by IR, hopefully be done by this Friday  -24 hour urine UPEP, immunofixation and light chain, beta-2 microglobulin, LDH (ordered)  I will follow up.   , , MD 04/06/2016  8:23 PM  

## 2016-04-07 ENCOUNTER — Ambulatory Visit: Payer: Medicare Other

## 2016-04-07 ENCOUNTER — Ambulatory Visit
Admit: 2016-04-07 | Discharge: 2016-04-07 | Disposition: A | Discharge status: Home / Self Care | Payer: Medicare Other | Attending: Radiation Oncology | Admitting: Radiation Oncology

## 2016-04-07 DIAGNOSIS — I10 Essential (primary) hypertension: Secondary | ICD-10-CM

## 2016-04-07 LAB — LACTATE DEHYDROGENASE: LDH: 146 U/L (ref 98–192)

## 2016-04-07 MED ORDER — GABAPENTIN 100 MG PO CAPS
100.0000 mg | ORAL_CAPSULE | Freq: Two times a day (BID) | ORAL | Status: DC
  Administered 2016-04-07 – 2016-04-12 (×10): 100 mg via ORAL
  Filled 2016-04-07 (×10): qty 1

## 2016-04-07 MED ORDER — ENSURE ENLIVE PO LIQD
237.0000 mL | Freq: Two times a day (BID) | ORAL | Status: DC
  Administered 2016-04-08 – 2016-04-12 (×4): 237 mL via ORAL

## 2016-04-07 NOTE — Progress Notes (Signed)
Nutrition Follow-up  INTERVENTION:   Provide Ensure Enlive po BID, each supplement provides 350 kcal and 20 grams of protein RD to continue to monitor  NUTRITION DIAGNOSIS:   Inadequate oral intake related to inability to eat as evidenced by NPO status.  -resolved.  GOAL:   Patient will meet greater than or equal to 90% of their needs  Progressing.  MONITOR:   PO intake, Supplement acceptance, Labs, Weight trends, I & O's  ASSESSMENT:   Pt with hx of schizophrenia and HTN who presents with back pain x 4 months. MRI of spine showed lesions and pathological fractures. She has refused oncology consult PTA.   Patient currently consuming 50-85% of meals.  Per chart review, pt has lost 23 lb since 5/1 (14% wt loss x 5 months, significant for time frame). Pt would benefit from nutritional supplement, will order.  Labs reviewed. Medications: IV Decadron, Colace BID, Miralax daily, Senokot tablet BID  Diet Order:  Diet regular Room service appropriate? Yes; Fluid consistency: Thin  Skin:  Reviewed, no issues  Last BM:  10/11  Height:   Ht Readings from Last 1 Encounters:  04/02/16 5' (1.524 m)    Weight:   Wt Readings from Last 1 Encounters:  04/02/16 141 lb (64 kg)    Ideal Body Weight:  45.4 kg  BMI:  Body mass index is 27.54 kg/m.  Estimated Nutritional Needs:   Kcal:  1400-1600  Protein:  70-80 grams  Fluid:  > 1.5 L/day  EDUCATION NEEDS:   No education needs identified at this time  Clayton Bibles, MS, RD, LDN Pager: [phone] After Hours Pager: [phone]

## 2016-04-07 NOTE — Progress Notes (Signed)
Physical Therapy Treatment Patient Details Name: Mikayla Marks MRN: [id number] DOB: [birth year] Today's Date: 04/07/2016    History of Present Illness 82 y.o. female admitted with back pain and now found to have thoracic spine bone mass T7-10. To undergo biopsy. PMH: schizoaffective disorder, carcinoma, osteoporosis, HTN.    PT Comments    Pt reports having radiation earlier however very agreeable to mobilize.  Pt reports desire to return to more independent lifestyle and be able to get out in community again.  Follow Up Recommendations  Home health PT;Supervision/Assistance - 24 hour     Equipment Recommendations  None recommended by PT    Recommendations for Other Services       Precautions / Restrictions Precautions Precautions: Fall    Mobility  Bed Mobility Overal bed mobility: Needs Assistance Bed Mobility: Rolling;Sidelying to Sit;Sit to Sidelying Rolling: Min assist Sidelying to sit: Min assist     Sit to sidelying: Min assist General bed mobility comments: pt recalls log roll technique from yesterday however still requires assist to perform better positioning, assist to raise trunk and bring LEs onto bed  Transfers Overall transfer level: Needs assistance Equipment used: Rolling walker (2 wheeled) Transfers: Sit to/from Stand Sit to Stand: Min assist         General transfer comment: assist to rise and stabilize  Ambulation/Gait Ambulation/Gait assistance: Min guard Ambulation Distance (Feet): 120 Feet Assistive device: Rolling walker (2 wheeled) Gait Pattern/deviations: Step-through pattern;Decreased stride length (forward head posture)     General Gait Details: pt agreeable to ambulate in hall today, tolerated distance well, LOB once however pt able to self correct with RW   Stairs            Wheelchair Mobility    Modified Rankin (Stroke Patients Only)       Balance                                    Cognition  Arousal/Alertness: Awake/alert Behavior During Therapy: WFL for tasks assessed/performed Overall Cognitive Status: No family/caregiver present to determine baseline cognitive functioning (tangential conversation)                      Exercises      General Comments        Pertinent Vitals/Pain Pain Assessment: Faces Faces Pain Scale: Hurts little more Pain Location: neck and back "brastrap" area Pain Intervention(s): Monitored during session;Limited activity within patient's tolerance    Home Living                      Prior Function            PT Goals (current goals can now be found in the care plan section) Progress towards PT goals: Progressing toward goals    Frequency    Min 3X/week      PT Plan Current plan remains appropriate    Co-evaluation             End of Session Equipment Utilized During Treatment: Gait belt Activity Tolerance: Patient tolerated treatment well Patient left: in bed;with call bell/phone within reach;with bed alarm set     Time: 1450-1505 PT Time Calculation (min) (ACUTE ONLY): 15 min  Charges:  $Gait Training: 8-22 mins                    G Codes:  Mikayla Marks,Mikayla Marks 04/07/2016, 3:37 PM Mikayla Marks, PT, DPT 04/07/2016 Pager: [phone]

## 2016-04-07 NOTE — Progress Notes (Addendum)
Mikayla Marks   DOB:1927-07-12   ZY#:[id number]   UXN#:[id number]  Subjective: Pt is tolerating radiation well, pain is controlled, no other new complains.   Objective:  Vitals:   04/07/16 1442 04/07/16 2127  BP: (!) 166/73 (!) 153/74  Pulse: (!) 105 70  Resp: 16 16  Temp: 98.6 F (37 C) 97.9 F (36.6 C)    Body mass index is 27.54 kg/m.  Intake/Output Summary (Last 24 hours) at 04/07/16 2318 Last data filed at 04/07/16 2053  Gross per 24 hour  Intake              360 ml  Output              950 ml  Net             -590 ml     Sclerae unicteric  Oropharynx clear  No peripheral adenopathy  Lungs clear -- no rales or rhonchi  Heart regular rate and rhythm  Abdomen benign  MSK: mild mid thoracic spine tenderness, no peripheral edema  Neuro nonfocal    CBG (last 3)  No results for input(s): GLUCAP in the last 72 hours.   Labs:  Lab Results  Component Value Date   WBC 7.5 04/06/2016   HGB 12.6 04/06/2016   HCT 38.0 04/06/2016   MCV 91.3 04/06/2016   PLT 288 04/06/2016   NEUTROABS 5.5 04/02/2016   CMP Latest Ref Rng & Units 04/06/2016 04/02/2016 11/11/2015  Glucose 65 - 99 mg/dL 120(H) 109(H) 105(H)  BUN 6 - 20 mg/dL 23(H) 12 18  Creatinine 0.44 - 1.00 mg/dL 0.88 0.87 0.93  Sodium 135 - 145 mmol/L 138 138 140  Potassium 3.5 - 5.1 mmol/L 4.5 3.8 3.6  Chloride 101 - 111 mmol/L 103 103 103  CO2 22 - 32 mmol/L _0 Calcium 8.9 - 10.3 mg/dL 8.9 9.9 9.6  Total Protein 6.5 - 8.1 g/dL 5.8(L) 7.0 7.4  Total Bilirubin 0.3 - 1.2 mg/dL 0.5 0.7 0.5  Alkaline Phos 38 - 126 U/L 51 55 80  AST 15 - 41 U/L _1 ALT 14 - 54 U/L _2 Urine Studies No results for input(s): UHGB, CRYS in the last 72 hours.  Invalid input(s): UACOL, UAPR, USPG, UPH, UTP, UGL, UKET, UBIL, UNIT, UROB, ULEU, UEPI, UWBC, URBC, UBAC, CAST, UCOM, BILUA  Basic Metabolic Panel:  Recent Labs Lab 04/02/16 1723 04/06/16 0338  NA 138 138  K 3.8 4.5  CL 103 103  CO2 24 26   GLUCOSE 109* 120*  BUN 12 23*  CREATININE 0.87 0.88  CALCIUM 9.9 8.9   GFR Estimated Creatinine Clearance: 38.3 mL/min (by C-G formula based on SCr of 0.88 mg/dL). Liver Function Tests:  Recent Labs Lab 04/02/16 1723 04/06/16 0338  AST 22 21  ALT 19 18  ALKPHOS 55 51  BILITOT 0.7 0.5  PROT 7.0 5.8*  ALBUMIN 4.0 3.1*    Recent Labs Lab 04/02/16 1723  LIPASE 31   No results for input(s): AMMONIA in the last 168 hours. Coagulation profile  Recent Labs Lab 04/02/16 1723  INR 1.03    CBC:  Recent Labs Lab 04/02/16 1723 04/06/16 0338  WBC 7.0 7.5  NEUTROABS 5.5  --   HGB 13.9 12.6  HCT 42.1 38.0  MCV 93.1 91.3  PLT 317 288   Cardiac Enzymes: No results for input(s): CKTOTAL, CKMB, CKMBINDEX, TROPONINI in the last 168 hours. BNP: Invalid input(s): POCBNP CBG:  No results for input(s): GLUCAP in the last 168 hours. D-Dimer No results for input(s): DDIMER in the last 72 hours. Hgb A1c No results for input(s): HGBA1C in the last 72 hours. Lipid Profile No results for input(s): CHOL, HDL, LDLCALC, TRIG, CHOLHDL, LDLDIRECT in the last 72 hours. Thyroid function studies No results for input(s): TSH, T4TOTAL, T3FREE, THYROIDAB in the last 72 hours.  Invalid input(s): FREET3 Anemia work up No results for input(s): VITAMINB12, FOLATE, FERRITIN, TIBC, IRON, RETICCTPCT in the last 72 hours. Microbiology Recent Results (from the past 240 hour(s))  Urine culture     Status: None   Collection Time: 04/02/16  4:18 PM  Result Value Ref Range Status   Specimen Description URINE, CLEAN CATCH  Final   Special Requests NONE  Final   Culture NO GROWTH  Final   Report Status 04/03/2016 FINAL  Final   PATHOLOGY REPORT Diagnosis 04/04/2016 Bone, biopsy, Thoracic 9 - PLASMA CELL NEOPLASM, SEE COMMENT. Microscopic Comment The bone biopsy reveals sheets of atypical plasma cells, which are positive for CD138. By light chain in situ hybridization the plasma cells are  kappa restricted. The case was called to Dr. Burr Medico on 04/06/2016.  Studies:  No results found.  Assessment: 82 y.o. with a past medical history significant for paranoid schizophrenia and HTNwho presents with worsening back pain for one year, much worse for the past 6 months.   1. 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 Schizophrenia 3. Hypertension 4. Back pain secondary to #1  Plan:  -I discussed with Dr.Manning today about her treatment plan, if she truly has oligo plasmacytoma of bone, Dr. Tammi Klippel may consider increase her radiation dose to definitive dose, which will be a total of 4 weeks  -will order a bone survey  -I will arrange bone marrow biopsy by IR on Monday, pt and her son are agreeable  -please consider SNF placement if she needs longer radiation.   I will follow up.   Truitt Merle, MD 04/07/2016  11:18 PM   Addendum I called pt's brother Ricahrd, POA, about her bone marrow biopsy. He agrees.  I called IR, her bone marrow biopsy will likely to be on Monday morning, IR PA Lennette Bihari will see her today.  Truitt Merle  04/08/2016 8:37 AM

## 2016-04-07 NOTE — Progress Notes (Signed)
No charge note.  Patient undergoing treatment and further oncology work up.  Pain is controlled.  Goals are up to date.   Palliative care will sign off for now.  Please re-consult [place] if we can be of further assistance in the future.  Imogene Burn, PA-C Palliative Medicine PMT Office [phone]

## 2016-04-07 NOTE — Progress Notes (Signed)
PROGRESS NOTE  Mikayla Marks [id number] DOB: July 20, 1927 DOA: 04/02/2016 PCP: Kathlene November, MD   LOS: 4 days   Brief Narrative: Mikayla Marks is a 82 y.o. female with a past medical history significant for paranoid schizophrenia and HTN who presents with back pain for 4 months. The patient was in her usual state of health until around mid-February when she went on a long car ride with her daughter and developed some back pain.  By April, this back pain had gotten much worse and she had a new severe kyphosis:  Mid April 2017: Seen at [facility], Dr. Mina Marble PM&R, MRI spine ordered which showed "lesions within the T8 and T9 vertebral bodies with associated pathologic fractures and osseous retropulsion...worrisome for metastatic disease, myeloma, or lymphoma".  Was referred to Oncology, did not call back. 10/26/15: Saw PCP in follow up, was recommended to see Oncology, refused. 11/11/15: Saw Pulmonology as self-referral for pleural effusions (noted incidentally on MRI of spine that showed metastasis) was recommended to see Oncology, have PET and biopsy via IR, refused 12/08/15: Saw PCP again, SPEP ordered and no M-spike (only "band of restricted mobility" in beta globulins), again referral to Hematology-Oncology and also IR that week June 2017: Saw Orthopedic surgery who recommended biopsy, family and patient declined again  She was admitted this time for worsening back pain.   Assessment & Plan: Principal Problem:   Intractable back pain Active Problems:   Schizophrenia (HCC)   Essential hypertension   Plasmacytoma (HCC)   Prolonged QT interval   Palliative care encounter   Spinal cord compression (HCC)   Plasmacytoma 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  She is  currently not an operative candidate.  - appreciate palliative input - oncology consulted, rad onc has seen the patient,Dr. Tammi Klippel is considering hypofractioning her treatment schedule, Dr. Tammi Klippel would like to start radiation treatment and transfer the patient to [facility]. Son is OB/GYN MD and HCPOA, he was made aware of this plan     HTN Well controlled.   Paranoid schizophrenia - Not in active treatment, have placed a psychiatry consult to see whether she would benefit from treatment at this point and also to assist on whether she has capacity to make medical decisions.  - awaiting psychiatric input.  Prolonged QTc - Avoid QT prolonging agents  Social - very difficult situation as patient is under care of daughter Mikayla Marks who -- based on my limited interaction -- does appear to have underlying psychiatric issues as well. It is quite clear that patient did not received adequate care in the past 6 months since her spinal tumor diagnosis. Under these circumstances she currently does not have a safe discharge plan and is not safe to go home.  - pending PT evaluation.    DVT prophylaxis: SCD's Code Status: DNR Family Communication: none at bedside, plan to call her son later today.  Disposition Plan:  Pending PT and psychiatry evaluation.   Consultants:   Palliative  Neurosurgery  Oncology  Radiation Oncology  Psychiatry   Procedures:   IR guided biopsy 10/9  Antimicrobials:  None    Subjective: Currently reports her pain is well controlled.    Objective: Vitals:   04/06/16 0542 04/06/16 1300 04/06/16 2100 04/07/16 0558  BP: (!) 142/67 Marland Kitchen)  159/67 (!) 141/54 (!) 153/79  Pulse: 62 86 74 72  Resp: 18 16 20 20   Temp: 98 F (36.7 C) 98.1 F (36.7 C) 98 F (36.7 C) 97.9 F (36.6 C)  TempSrc: Oral Oral Oral Oral  SpO2: 94% 96% 94% 97%  Weight:      Height:        Intake/Output Summary (Last 24 hours) at 04/07/16 1254 Last data filed at 04/07/16 0956   Gross per 24 hour  Intake              240 ml  Output                0 ml  Net              240 ml   Filed Weights   04/02/16 1404 04/02/16 2311  Weight: 73 kg (161 lb) 64 kg (141 lb)    Examination: Constitutional: NAD Vitals:   04/06/16 0542 04/06/16 1300 04/06/16 2100 04/07/16 0558  BP: (!) 142/67 (!) 159/67 (!) 141/54 (!) 153/79  Pulse: 62 86 74 72  Resp: 18 16 20 20   Temp: 98 F (36.7 C) 98.1 F (36.7 C) 98 F (36.7 C) 97.9 F (36.6 C)  TempSrc: Oral Oral Oral Oral  SpO2: 94% 96% 94% 97%  Weight:      Height:       Respiratory: clear to auscultation bilaterally, no wheezing, no crackles.  Cardiovascular: Regular rate and rhythm, no murmurs / rubs / gallops. No LE edema.  Abdomen: no tenderness. Bowel sounds positive.  Musculoskeletal: no clubbing / cyanosis.  Skin: no rashes. Chronic venous stasis changes LE. Neurologic: non focal   Psychiatric: Normal judgment and insight. Alert and oriented x 3. Normal mood.   Data Reviewed: I have personally reviewed following labs and imaging studies  CBC:  Recent Labs Lab 04/02/16 1723 04/06/16 0338  WBC 7.0 7.5  NEUTROABS 5.5  --   HGB 13.9 12.6  HCT 42.1 38.0  MCV 93.1 91.3  PLT 317 [id number]   Basic Metabolic Panel:  Recent Labs Lab 04/02/16 1723 04/06/16 0338  NA 138 138  K 3.8 4.5  CL 103 103  CO2 24 26  GLUCOSE 109* 120*  BUN 12 23*  CREATININE 0.87 0.88  CALCIUM 9.9 8.9   GFR: Estimated Creatinine Clearance: 38.3 mL/min (by C-G formula based on SCr of 0.88 mg/dL). Liver Function Tests:  Recent Labs Lab 04/02/16 1723 04/06/16 0338  AST 22 21  ALT 19 18  ALKPHOS 55 51  BILITOT 0.7 0.5  PROT 7.0 5.8*  ALBUMIN 4.0 3.1*    Recent Labs Lab 04/02/16 1723  LIPASE 31   No results for input(s): AMMONIA in the last 168 hours. Coagulation Profile:  Recent Labs Lab 04/02/16 1723  INR 1.03   Cardiac Enzymes: No results for input(s): CKTOTAL, CKMB, CKMBINDEX, TROPONINI in the last 168  hours. BNP (last 3 results)  Recent Labs  11/11/15 1623  PROBNP 68.0   Urine analysis:    Component Value Date/Time   COLORURINE YELLOW 04/02/2016 [place] 04/02/2016 1617   LABSPEC 1.010 04/02/2016 1617   PHURINE 6.0 04/02/2016 1617   GLUCOSEU NEGATIVE 04/02/2016 1617   HGBUR TRACE (A) 04/02/2016 1617   HGBUR trace 03/04/2008 1529   BILIRUBINUR NEGATIVE 04/02/2016 1617   KETONESUR 15 (A) 04/02/2016 1617   PROTEINUR NEGATIVE 04/02/2016 1617   UROBILINOGEN 0.2 10/23/2010 0505   NITRITE NEGATIVE 04/02/2016 1617   LEUKOCYTESUR NEGATIVE  04/02/2016 1617   Sepsis Labs: Invalid input(s): PROCALCITONIN, LACTICIDVEN  Recent Results (from the past 240 hour(s))  Urine culture     Status: None   Collection Time: 04/02/16  4:18 PM  Result Value Ref Range Status   Specimen Description URINE, CLEAN CATCH  Final   Special Requests NONE  Final   Culture NO GROWTH  Final   Report Status 04/03/2016 FINAL  Final    Radiology Studies: No results found. Scheduled Meds: . cycloSPORINE  1 drop Both Eyes BID  . dexamethasone  4 mg Intravenous Q24H  . docusate sodium  100 mg Oral BID  . oxyCODONE  5 mg Oral Q6H  . polyethylene glycol  17 g Oral Daily  . senna  1 tablet Oral BID   Continuous Infusions:     Hosie Poisson, MD [facility] Pager [phone]  If 7PM-7AM, please contact night-coverage [email or web address] Password [email or web address] 04/07/2016, 12:54 PM

## 2016-04-08 ENCOUNTER — Ambulatory Visit
Admit: 2016-04-08 | Discharge: 2016-04-08 | Disposition: A | Discharge status: Home / Self Care | Payer: Medicare Other | Attending: Radiation Oncology | Admitting: Radiation Oncology

## 2016-04-08 ENCOUNTER — Ambulatory Visit: Payer: Medicare Other

## 2016-04-08 ENCOUNTER — Inpatient Hospital Stay (HOSPITAL_COMMUNITY): Payer: Medicare Other

## 2016-04-08 ENCOUNTER — Other Ambulatory Visit: Payer: Self-pay | Admitting: Hematology

## 2016-04-08 VITALS — BP 131/63 | HR 92 | Resp 16

## 2016-04-08 DIAGNOSIS — C9030 Solitary plasmacytoma not having achieved remission: Secondary | ICD-10-CM

## 2016-04-08 LAB — BASIC METABOLIC PANEL
Anion gap: 9 (ref 5–15)
BUN: 28 mg/dL — AB (ref 4–21)
BUN: 28 mg/dL — AB (ref 6–20)
CALCIUM: 9.1 mg/dL (ref 8.9–10.3)
CHLORIDE: 104 mmol/L (ref 101–111)
CO2: 24 mmol/L (ref 22–32)
CREATININE: 0.76 mg/dL (ref 0.44–1.00)
CREATININE: 0.8 mg/dL (ref 0.5–1.1)
GFR calc non Af Amer: >60 mL/min (ref >60)
GLUCOSE: 94 mg/dL
GLUCOSE: 94 mg/dL (ref 65–99)
Potassium: 3.7 mmol/L (ref 3.4–5.3)
Potassium: 3.7 mmol/L (ref 3.5–5.1)
Sodium: 137 mmol/L (ref 135–145)
Sodium: 137 mmol/L (ref 137–147)

## 2016-04-08 LAB — BETA 2 MICROGLOBULIN, SERUM: Beta-2 Microglobulin: 2 mg/L (ref 0.6–2.4)

## 2016-04-08 MED ORDER — CYCLOSPORINE 0.05 % OP EMUL
1.0000 [drp] | Freq: Two times a day (BID) | OPHTHALMIC | Status: DC
  Administered 2016-04-08 – 2016-04-12 (×9): 1 [drp] via OPHTHALMIC
  Filled 2016-04-08 (×10): qty 1

## 2016-04-08 NOTE — Progress Notes (Signed)
  Radiation Oncology         [phone]) [phone] INPATIENT Name: Mikayla Marks MRN: [id number]   Date: 04/08/2016  DOB: 01-09-1928   Weekly Radiation Therapy Management    ICD-9-CM ICD-10-CM   1. 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  Vitals stable. Denies pain. Reports back is sore. Explains she ambulated with the aid of PT today. Reports she had a bowel movement last night. Denies dysuria or hematuria. Denies pain associated with swallowing. Denies nausea or vomiting. Reports increased flatus. Patient demonstrates that she can wiggle her toes bilaterally. Patient receiving decadron 4 mg IV every 24 hours  The patient is without complaint. Set-up films were reviewed. The chart was checked.  Physical Findings  blood pressure is 131/63 and her pulse is 92. Her respiration is 16 and oxygen saturation is 100%. . Weight essentially stable.  No significant changes. Patient presents in a hospital bed.  Impression The patient is tolerating radiation.   Plan Continue treatment as planned.         Sheral Apley Tammi Klippel, M.D.  This document serves as a record of services personally performed by Tyler Pita, MD. It was created on his behalf by Maryla Morrow, a trained medical scribe. The creation of this record is based on the scribe's personal observations and the provider's statements to them. This document has been checked and approved by the attending provider.

## 2016-04-08 NOTE — Progress Notes (Signed)
PROGRESS NOTE  Mikayla Marks HQI:[id number] DOB: [birth year] DOA: 04/02/2016 PCP: Kathlene November, MD   LOS: 5 days   Brief Narrative: Mikayla Marks is a 82 y.o. female with a past medical history significant for paranoid schizophrenia and HTN who presents with back pain for 4 months. The patient was in her usual state of health until around mid-February when she went on a long car ride with her daughter and developed some back pain.  By April, this back pain had gotten much worse and she had a new severe kyphosis:  Mid April 2017: Seen at [facility], Dr. Mina Marble PM&R, MRI spine ordered which showed "lesions within the T8 and T9 vertebral bodies with associated pathologic fractures and osseous retropulsion...worrisome for metastatic disease, myeloma, or lymphoma".  Was referred to Oncology, did not call back. 10/26/15: Saw PCP in follow up, was recommended to see Oncology, refused. 11/11/15: Saw Pulmonology as self-referral for pleural effusions (noted incidentally on MRI of spine that showed metastasis) was recommended to see Oncology, have PET and biopsy via IR, refused 12/08/15: Saw PCP again, SPEP ordered and no M-spike (only "band of restricted mobility" in beta globulins), again referral to Hematology-Oncology and also IR that week June 2017: Saw Orthopedic surgery who recommended biopsy, family and patient declined again  She was admitted this time for worsening back pain.  Bone marrow biopsy for evaluation of MM done today.  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  She is currently not an operative candidate.  - appreciate palliative input - oncology consulted, rad onc has seen the patient, pt was transferred to [facility] for radiation treatments.  - IR guided bone biopsy done.  Son is OB/GYN MD and HCPOA, he was made aware of this plan     HTN Well controlled.   Paranoid schizophrenia - psychiatry consulted and recommendations given.   Prolonged QTc - Avoid QT prolonging agents  Social - pt wants to go home with home PT,.   DVT prophylaxis: SCD's Code Status: DNR Family Communication: none at bedside, discussed the plan of care with the patient.  Disposition Plan:  Pt wants to be discharged home.   Consultants:   Palliative  Neurosurgery  Oncology  Radiation Oncology  Psychiatry   Procedures:   IR guided biopsy 10/9, 10/13  Antimicrobials:  None    Subjective: Currently reports her pain is well controlled.  No new complaints.    Objective: Vitals:   04/07/16 0558 04/07/16 1442 04/07/16 2127 04/08/16 0527  BP: (!) 153/79 (!) 166/73 (!) 153/74 (!) 160/78  Pulse: 72 (!) 105 70 67  Resp: 20 16 16 16   Temp: 97.9 F (36.6 C) 98.6 F (37 C) 97.9 F (36.6 C) 97.5 F (36.4 C)  TempSrc: Oral Oral Oral Oral  SpO2: 97% 95% 93% 98%  Weight:      Height:        Intake/Output Summary (Last 24 hours) at 04/08/16 1903 Last data filed at 04/08/16 1800  Gross per 24 hour  Intake  720 ml  Output             1600 ml  Net             -880 ml   Filed Weights   04/02/16 1404 04/02/16 2311  Weight: 73 kg (161 lb) 64 kg (141 lb)    Examination: Constitutional: NAD Vitals:   04/07/16 0558 04/07/16 1442 04/07/16 2127 04/08/16 0527  BP: (!) 153/79 (!) 166/73 (!) 153/74 (!) 160/78  Pulse: 72 (!) 105 70 67  Resp: 20 16 16 16   Temp: 97.9 F (36.6 C) 98.6 F (37 C) 97.9 F (36.6 C) 97.5 F (36.4 C)  TempSrc: Oral Oral Oral Oral  SpO2: 97% 95% 93% 98%  Weight:      Height:        Respiratory: clear to auscultation bilaterally, no wheezing, no crackles.  Cardiovascular: Regular rate and rhythm, no murmurs / rubs / gallops. No LE edema.  Abdomen: no tenderness. Bowel sounds positive.  Musculoskeletal: no clubbing / cyanosis.  Skin: no rashes. Chronic venous stasis changes LE. Neurologic: non focal   Psychiatric: Normal judgment and insight. Alert and oriented x 3. Normal mood.   Data Reviewed: I have personally reviewed following labs and imaging studies  CBC:  Recent Labs Lab 04/02/16 1723 04/06/16 0338  WBC 7.0 7.5  NEUTROABS 5.5  --   HGB 13.9 12.6  HCT 42.1 38.0  MCV 93.1 91.3  PLT 317 778   Basic Metabolic Panel:  Recent Labs Lab 04/02/16 1723 04/06/16 0338 04/08/16 1014  NA 138 138 137  K 3.8 4.5 3.7  CL 103 103 104  CO2 24 26 24   GLUCOSE 109* 120* 94  BUN 12 23* 28*  CREATININE 0.87 0.88 0.76  CALCIUM 9.9 8.9 9.1   GFR: Estimated Creatinine Clearance: 42.2 mL/min (by C-G formula based on SCr of 0.76 mg/dL). Liver Function Tests:  Recent Labs Lab 04/02/16 1723 04/06/16 0338  AST 22 21  ALT 19 18  ALKPHOS 55 51  BILITOT 0.7 0.5  PROT 7.0 5.8*  ALBUMIN 4.0 3.1*    Recent Labs Lab 04/02/16 1723  LIPASE 31   No results for input(s): AMMONIA in the last 168 hours. Coagulation Profile:  Recent Labs Lab 04/02/16 1723  INR 1.03   Cardiac Enzymes: No results for input(s): CKTOTAL, CKMB, CKMBINDEX, TROPONINI in the last 168 hours. BNP (last 3 results)  Recent Labs  11/11/15 1623  PROBNP 68.0   Urine analysis:    Component Value Date/Time   COLORURINE YELLOW 04/02/2016 [place] 04/02/2016 1617   LABSPEC 1.010 04/02/2016 1617   PHURINE 6.0 04/02/2016 1617   GLUCOSEU NEGATIVE 04/02/2016 1617   HGBUR TRACE (A) 04/02/2016 1617   HGBUR trace 03/04/2008 1529   BILIRUBINUR NEGATIVE 04/02/2016 1617   KETONESUR 15 (A) 04/02/2016 1617   PROTEINUR NEGATIVE 04/02/2016 1617   UROBILINOGEN 0.2  10/23/2010 0505   NITRITE NEGATIVE 04/02/2016 1617   LEUKOCYTESUR NEGATIVE 04/02/2016 1617   Sepsis Labs: Invalid input(s): PROCALCITONIN, LACTICIDVEN  Recent Results (from the past 240 hour(s))  Urine culture     Status: None   Collection Time: 04/02/16  4:18 PM  Result Value Ref Range Status   Specimen Description URINE, CLEAN CATCH  Final   Special Requests NONE  Final   Culture NO GROWTH  Final   Report Status 04/03/2016 FINAL  Final    Radiology Studies: Dg Bone Survey Met  Result Date: 04/08/2016 CLINICAL DATA:  Recent  diagnosis of myeloma. History of a neck fracture according to the patient. EXAM: METASTATIC BONE SURVEY COMPARISON:  None in PACs FINDINGS: Calvarium and spine: No lytic nor blastic lesions are noted within the calvarium or spine. There are compression fractures in the mid to lower thoracic spine with prominent kyphosis. However, fine detail of these fractures is lacking due to osteopenia. Upper extremities: No lytic nor blastic lesions are observed. There are degenerative changes of the first [facility] joint on the right. Ribs and chest: The lungs are hypoinflated. There is increased density at the left lung base. There is thickening of the minor fissure. There is a fracture of the posterior aspect of the left seventh rib. There is calcification in the wall of the aortic arch. The cardiac silhouette is enlarged. The pulmonary vascularity is not engorged. Abdomen and pelvis: The colonic stool burden is moderate. There surgical clips in the gallbladder fossa. The bony pelvis is osteopenic but there is no lytic nor blastic lesion. Hips and lower extremities: The hip joint spaces are reasonably well-maintained. There is a 3 mm diameter lucency in the medial aspect of the left femoral neck. The remainder the lower extremities exhibit no suspicious findings. IMPRESSION: Mid thoracic compression fractures which are poorly evaluated on this study due to osteopenia and overlying densities.  Fracture the posterior aspect of the left seventh rib which may be pathologic in nature. 3 mm diameter lucency in the medial aspect of the left femoral neck. Bibasilar atelectasis or pneumonia greatest on the left. Cardiomegaly without pulmonary edema. Aortic atherosclerosis. Electronically Signed   By: David  Martinique M.D.   On: 04/08/2016 09:49   Scheduled Meds: . cycloSPORINE  1 drop Both Eyes BID  . dexamethasone  4 mg Intravenous Q24H  . docusate sodium  100 mg Oral BID  . feeding supplement (ENSURE ENLIVE)  237 mL Oral BID BM  . gabapentin  100 mg Oral BID  . oxyCODONE  5 mg Oral Q6H  . polyethylene glycol  17 g Oral Daily  . senna  1 tablet Oral BID   Continuous Infusions:     Hosie Poisson, MD [facility] Pager [phone]  If 7PM-7AM, please contact night-coverage [email or web address] Password [email or web address] 04/08/2016, 7:03 PM

## 2016-04-08 NOTE — Progress Notes (Signed)
Vitals stable. Denies pain. Reports back is sore. Explains she ambulated with the aid of PT today. Reports she had a bowel movement last night. Denies dysuria or hematuria. Denies pain associated with swallowing. Denies nausea or vomiting. Reports increased flatus. Patient demonstrates that she can wiggle her toes bilaterally. Patient receiving decadron 4 mg IV every 24 hours.  BP 131/63 (BP Location: Left Arm, Patient Position: Sitting, Cuff Size: Normal)   Pulse 92   Resp 16   SpO2 100%  Wt Readings from Last 3 Encounters:  04/02/16 141 lb (64 kg)  12/08/15 162 lb (73.5 kg)  11/11/15 161 lb 3.2 oz (73.1 kg)

## 2016-04-08 NOTE — Care Management Note (Signed)
Case Management Note  Patient Details  Name: Mikayla Marks MRN: [id number] Date of Birth: March 19, 1928  Subjective/Objective:  82 yo admitted with Intractable Back Pain                  Action/Plan: From home with daughter. Pt was deemed to have capacity to make her own decisions and PT eval recommended HHPT at discharge. Pt offered choice for [facility] services and [facility] was chosen. Pt requested that I call son Percell Miller to inform him of disposition plan. This CM contacted Percell Miller, and Percell Miller was ok with plan for pt to dc home with [facility] services. [facility] rep was contacted for referral. This CM printed out SCAT application for pt and also emailed it to her son Percell Miller. Pt also given schedule of upcoming radiation treatments. No other CM needs communicated.   Expected Discharge Date:  04/06/16               Expected Discharge Plan:  [place]  In-House Referral:     Discharge planning Services  CM Consult  Post Acute Care Choice:  Home Health Choice offered to:  Patient  DME Arranged:    DME Agency:     HH Arranged:  RN, PT Decker Agency:  [place]  Status of Service:  In process, will continue to follow  If discussed at Long Length of Stay Meetings, dates discussed:    Additional CommentsLynnell Catalan, RN 04/08/2016, 2:51 PM [phone]

## 2016-04-08 NOTE — Progress Notes (Signed)
Referring Physician(s): Feng,Y  Supervising Physician: Sandi Mariscal  Patient Status:  [facility] - In-pt  Chief Complaint:  plasmacytoma  Subjective: Patient familiar to IR service from prior T9 biopsy on 04/04/16 which revealed plasmacytoma.  She is currently receiving spinal radiation. Request now received for CT-guided bone marrow biopsy to determine whether she has significant plasma cell infiltrate in marrow, likely classifying this as a multiple myeloma and therefore possible need for systemic therapy. Additional history as listed below. She currently denies fever, headache, vomiting or abnormal bleeding. She does have back and some intermittent chest/abd discomfort . Past Medical History:  Diagnosis Date  . Hyperlipidemia   . HYPERTENSION   . Osteoporosis   . SCC (squamous cell carcinoma)    in situ R pretibial area  . Schizoaffective disorder [facility])    Past Surgical History:  Procedure Laterality Date  . BREAST BIOPSY     benign tumor  . CATARACT EXTRACTION    . CHOLECYSTECTOMY    . IR GENERIC HISTORICAL  04/04/2016   IR FLUORO GUIDED NEEDLE PLC ASPIRATION/INJECTION LOC 04/04/2016 Luanne Bras, MD [facility]-INTERV RAD  . LASER ABLATION Left    left LE   . LEG SURGERY Right 10/2012   Ruptured Veins  . PARTIAL HYSTERECTOMY     due to bleeding  . TONSILLECTOMY        Allergies: Other; Bacitracin-polymyxin b; and Penicillins  Medications: Prior to Admission medications   Medication Sig Start Date End Date Taking? Authorizing Provider  acetaminophen-codeine (TYLENOL #3) 300-30 MG tablet Take 1 tablet by mouth every 8 (eight) hours as needed for severe pain. 03/23/16  Yes Colon Branch, MD  Calcium Carbonate (CALTRATE 600 PO) Take 1 tablet by mouth daily.    Yes Historical Provider, MD  cycloSPORINE (RESTASIS) 0.05 % ophthalmic emulsion Place 1 drop into both eyes 2 (two) times daily.   Yes Historical Provider, MD  Liniments (SALONPAS EX) Apply 1 patch topically as needed (for  pain).   Yes Historical Provider, MD  Multiple Vitamins-Minerals (MULTIVITAMIN WITH MINERALS) tablet Take 1 tablet by mouth 4 (four) times a week.    Yes Historical Provider, MD  Probiotic Product (PROBIOTIC PO) Take 2 capsules by mouth daily.   Yes Historical Provider, MD  tiZANidine (ZANAFLEX) 4 MG tablet Take 4-8 mg by mouth daily as needed for muscle spasms.   Yes Historical Provider, MD     Vital Signs: BP (!) 160/78 (BP Location: Left Arm)   Pulse 67   Temp 97.5 F (36.4 C) (Oral)   Resp 16   Ht 5' (1.524 m)   Wt 141 lb (64 kg)   SpO2 98%   BMI 27.54 kg/m   Physical Exam patient awake, alert. Chest with slightly diminished breath sounds bases; heart with regular rate and rhythm, abdomen protuberant, positive bowel sounds, not significantly tender. Lower extremities- no edema. Mid thoracic spine tenderness.  Imaging: Dg Bone Survey Met  Result Date: 04/08/2016 CLINICAL DATA:  Recent diagnosis of myeloma. History of a neck fracture according to the patient. EXAM: METASTATIC BONE SURVEY COMPARISON:  None in PACs FINDINGS: Calvarium and spine: No lytic nor blastic lesions are noted within the calvarium or spine. There are compression fractures in the mid to lower thoracic spine with prominent kyphosis. However, fine detail of these fractures is lacking due to osteopenia. Upper extremities: No lytic nor blastic lesions are observed. There are degenerative changes of the first [facility] joint on the right. Ribs and chest: The lungs are  hypoinflated. There is increased density at the left lung base. There is thickening of the minor fissure. There is a fracture of the posterior aspect of the left seventh rib. There is calcification in the wall of the aortic arch. The cardiac silhouette is enlarged. The pulmonary vascularity is not engorged. Abdomen and pelvis: The colonic stool burden is moderate. There surgical clips in the gallbladder fossa. The bony pelvis is osteopenic but there is no lytic nor  blastic lesion. Hips and lower extremities: The hip joint spaces are reasonably well-maintained. There is a 3 mm diameter lucency in the medial aspect of the left femoral neck. The remainder the lower extremities exhibit no suspicious findings. IMPRESSION: Mid thoracic compression fractures which are poorly evaluated on this study due to osteopenia and overlying densities. Fracture the posterior aspect of the left seventh rib which may be pathologic in nature. 3 mm diameter lucency in the medial aspect of the left femoral neck. Bibasilar atelectasis or pneumonia greatest on the left. Cardiomegaly without pulmonary edema. Aortic atherosclerosis. Electronically Signed   By: David  Martinique M.D.   On: 04/08/2016 09:49    Labs:  CBC:  Recent Labs  08/07/15 1127 11/11/15 1623 04/02/16 1723 04/06/16 0338  WBC 4.6 9.3 7.0 7.5  HGB 13.5 12.4 13.9 12.6  HCT 43.4 36.5 42.1 38.0  PLT 355.0 390.0 317 288    COAGS:  Recent Labs  04/02/16 1723  INR 1.03    BMP:  Recent Labs  11/11/15 1623 04/02/16 1723 04/06/16 0338 04/08/16 1014  NA 140 138 138 137  K 3.6 3.8 4.5 3.7  CL 103 103 103 104  CO2 _0 GLUCOSE 105* 109* 120* 94  BUN 18 12 23* 28*  CALCIUM 9.6 9.9 8.9 9.1  CREATININE 0.93 0.87 0.88 0.76  GFRNONAA  --  59* 58* >60  GFRAA  --  >60 >60 >60    LIVER FUNCTION TESTS:  Recent Labs  08/07/15 1127 11/11/15 1623 04/02/16 1723 04/06/16 0338  BILITOT  --  0.5 0.7 0.5  AST _1 ALT _2 ALKPHOS  --  80 55 51  PROT  --  7.4 7.0 5.8*  ALBUMIN  --  4.2 4.0 3.1*    Assessment and Plan: Patient with history of plasmacytoma involving T8 and T9 with associated back pain; currently receiving spinal radiation;request now received for CT-guided bone marrow biopsy to determine whether she has significant plasma cell infiltrate in marrow, likely classifying this as a multiple myeloma and therefore possible need for systemic therapy. Risks and benefits  discussed with the patient including, but not limited to bleeding, infection, damage to adjacent structures or low yield requiring additional tests.All of the patient's questions were answered, patient is agreeable to proceed.Consent signed and in chart. Procedure is tentatively scheduled for 10/16 a.m. Family aware of plans.     Electronically Signed: D. Rowe Robert 04/08/2016, 12:20 PM   I spent a total of 20 minutes at the the patient's bedside AND on the patient's hospital floor or unit, greater than 50% of which was counseling/coordinating care for CT guided  bone marrow biopsy    Patient ID: Mikayla Marks, female   DOB: Feb 13, 1928, 82 y.o.   MRN: [id number]

## 2016-04-08 NOTE — Progress Notes (Signed)
PT Cancellation Note  Patient Details Name: Mikayla Marks MRN: [id number] DOB: [birth year]   Cancelled Treatment:     pt out of room at Radiation.  Will check back as schedule permits.    Rica Koyanagi  PTA [facility]  Acute  Rehab Pager      [phone]

## 2016-04-09 DIAGNOSIS — F200 Paranoid schizophrenia: Secondary | ICD-10-CM

## 2016-04-09 NOTE — Progress Notes (Addendum)
PROGRESS NOTE  DEANNA WIATER EML:[id number] DOB: 1928-04-12 DOA: 04/02/2016 PCP: Kathlene November, MD   LOS: 6 days   Brief Narrative: Mikayla Marks is a 82 y.o. female with a past medical history significant for paranoid schizophrenia and HTN who presents with back pain for 4 months. The patient was in her usual state of health until around mid-February when she went on a long car ride with her daughter and developed some back pain.  By April, this back pain had gotten much worse and she had a new severe kyphosis:  Mid April 2017: Seen at [place], Dr. Mina Marble PM&R, MRI spine ordered which showed "lesions within the T8 and T9 vertebral bodies with associated pathologic fractures and osseous retropulsion...worrisome for metastatic disease, myeloma, or lymphoma".  Was referred to Oncology, did not call back. 10/26/15: Saw PCP in follow up, was recommended to see Oncology, refused. 11/11/15: Saw Pulmonology as self-referral for pleural effusions (noted incidentally on MRI of spine that showed metastasis) was recommended to see Oncology, have PET and biopsy via IR, refused 12/08/15: Saw PCP again, SPEP ordered and no M-spike (only "band of restricted mobility" in beta globulins), again referral to Hematology-Oncology and also IR that week June 2017: Saw Orthopedic surgery who recommended biopsy, family and patient declined again  She was admitted this time for worsening back pain.  Bone marrow biopsy for evaluation of MM scheduled for Monday on 10/16.  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  She is currently not an operative candidate.  - appreciate palliative input - oncology consulted, rad onc has seen the patient, pt was transferred to [facility] for radiation treatments.  - IR guided bone biopsy done, repeat biopsy scheduled on 10/16 for evaluation of MM.   - CURRENTLY she denies any new complaints.  Son is OB/GYN MD and HCPOA, he was made aware of this plan     HTN Well controlled.   Paranoid schizophrenia - psychiatry consulted and recommendations given.   Prolonged QTc - Avoid QT prolonging agents  Social - pt wants to go home with home PT,.   DVT prophylaxis: SCD's Code Status: DNR Family Communication: none at bedside, discussed the plan of care with the patient.  Disposition Plan:  Pt wants to be discharged home.   Consultants:   Palliative  Neurosurgery  Oncology  Radiation Oncology  Psychiatry   Procedures:   IR guided biopsy 10/9, 10/13  Antimicrobials:  None    Subjective: Currently reports her pain is well controlled.  No new complaints by the patient.  RN reports pt had hallucinations earlier this am with people trying tokill her.  Currently she is calm and appears comfortable, denies seeing anybody new .    Objective: Vitals:   04/08/16 0527 04/08/16 2031 04/09/16 0602 04/09/16 1500  BP: (!) 160/78 (!) 126/58 134/71 134/62  Pulse: 67 76 (!) 57 78  Resp: _0 Temp: 97.5 F (36.4 C) 98.6 F (37 C) 97.5 F (36.4 C) 98.6 F (37 C)  TempSrc: Oral  Oral Oral Oral  SpO2: 98% 94% 96% 96%  Weight:      Height:        Intake/Output Summary (Last 24 hours) at 04/09/16 1532 Last data filed at 04/09/16 1500  Gross per 24 hour  Intake             1180 ml  Output              450 ml  Net              730 ml   Filed Weights   04/02/16 1404 04/02/16 2311  Weight: 73 kg (161 lb) 64 kg (141 lb)    Examination: Constitutional: NAD alert and comfortable.  Vitals:    04/08/16 0527 04/08/16 2031 04/09/16 0602 04/09/16 1500  BP: (!) 160/78 (!) 126/58 134/71 134/62  Pulse: 67 76 (!) 57 78  Resp: _0 Temp: 97.5 F (36.4 C) 98.6 F (37 C) 97.5 F (36.4 C) 98.6 F (37 C)  TempSrc: Oral Oral Oral Oral  SpO2: 98% 94% 96% 96%  Weight:      Height:       Respiratory: clear to auscultation bilaterally, no wheezing, no crackles.  Cardiovascular: Regular rate and rhythm, no murmurs / rubs / gallops. No LE edema.  Abdomen: no tenderness. Bowel sounds positive.  Musculoskeletal: no clubbing / cyanosis.  Skin: no rashes. Chronic venous stasis changes LE. Neurologic: non focal   Psychiatric: Normal judgment and insight. Alert and oriented x 3. Normal mood.   Data Reviewed: I have personally reviewed following labs and imaging studies  CBC:  Recent Labs Lab 04/02/16 1723 04/06/16 0338  WBC 7.0 7.5  NEUTROABS 5.5  --   HGB 13.9 12.6  HCT 42.1 38.0  MCV 93.1 91.3  PLT 317 001   Basic Metabolic Panel:  Recent Labs Lab 04/02/16 1723 04/06/16 0338 04/08/16 1014  NA 138 138 137  K 3.8 4.5 3.7  CL 103 103 104  CO2 _1 GLUCOSE 109* 120* 94  BUN 12 23* 28*  CREATININE 0.87 0.88 0.76  CALCIUM 9.9 8.9 9.1   GFR: Estimated Creatinine Clearance: 42.2 mL/min (by C-G formula based on SCr of 0.76 mg/dL). Liver Function Tests:  Recent Labs Lab 04/02/16 1723 04/06/16 0338  AST 22 21  ALT 19 18  ALKPHOS 55 51  BILITOT 0.7 0.5  PROT 7.0 5.8*  ALBUMIN 4.0 3.1*    Recent Labs Lab 04/02/16 1723  LIPASE 31   No results for input(s): AMMONIA in the last 168 hours. Coagulation Profile:  Recent Labs Lab 04/02/16 1723  INR 1.03   Cardiac Enzymes: No results for input(s): CKTOTAL, CKMB, CKMBINDEX, TROPONINI in the last 168 hours. BNP (last 3 results)  Recent Labs  11/11/15 1623  PROBNP 68.0   Urine analysis:    Component Value Date/Time   COLORURINE YELLOW 04/02/2016 [place] 04/02/2016 1617    LABSPEC 1.010 04/02/2016 1617   PHURINE 6.0 04/02/2016 1617   GLUCOSEU NEGATIVE 04/02/2016 1617   HGBUR TRACE (A) 04/02/2016 1617   HGBUR trace 03/04/2008 1529   BILIRUBINUR NEGATIVE 04/02/2016 1617   KETONESUR 15 (A) 04/02/2016 1617   PROTEINUR NEGATIVE 04/02/2016 1617   UROBILINOGEN 0.2 10/23/2010 0505   NITRITE NEGATIVE 04/02/2016 1617   LEUKOCYTESUR NEGATIVE 04/02/2016 1617   Sepsis Labs: Invalid input(s): PROCALCITONIN, LACTICIDVEN  Recent Results (from the past 240 hour(s))  Urine culture     Status: None  Collection Time: 04/02/16  4:18 PM  Result Value Ref Range Status   Specimen Description URINE, CLEAN CATCH  Final   Special Requests NONE  Final   Culture NO GROWTH  Final   Report Status 04/03/2016 FINAL  Final    Radiology Studies: Dg Bone Survey Met  Result Date: 04/08/2016 CLINICAL DATA:  Recent diagnosis of myeloma. History of a neck fracture according to the patient. EXAM: METASTATIC BONE SURVEY COMPARISON:  None in PACs FINDINGS: Calvarium and spine: No lytic nor blastic lesions are noted within the calvarium or spine. There are compression fractures in the mid to lower thoracic spine with prominent kyphosis. However, fine detail of these fractures is lacking due to osteopenia. Upper extremities: No lytic nor blastic lesions are observed. There are degenerative changes of the first [facility] joint on the right. Ribs and chest: The lungs are hypoinflated. There is increased density at the left lung base. There is thickening of the minor fissure. There is a fracture of the posterior aspect of the left seventh rib. There is calcification in the wall of the aortic arch. The cardiac silhouette is enlarged. The pulmonary vascularity is not engorged. Abdomen and pelvis: The colonic stool burden is moderate. There surgical clips in the gallbladder fossa. The bony pelvis is osteopenic but there is no lytic nor blastic lesion. Hips and lower extremities: The hip joint spaces are  reasonably well-maintained. There is a 3 mm diameter lucency in the medial aspect of the left femoral neck. The remainder the lower extremities exhibit no suspicious findings. IMPRESSION: Mid thoracic compression fractures which are poorly evaluated on this study due to osteopenia and overlying densities. Fracture the posterior aspect of the left seventh rib which may be pathologic in nature. 3 mm diameter lucency in the medial aspect of the left femoral neck. Bibasilar atelectasis or pneumonia greatest on the left. Cardiomegaly without pulmonary edema. Aortic atherosclerosis. Electronically Signed   By: David  Martinique M.D.   On: 04/08/2016 09:49   Scheduled Meds: . cycloSPORINE  1 drop Both Eyes BID  . dexamethasone  4 mg Intravenous Q24H  . docusate sodium  100 mg Oral BID  . feeding supplement (ENSURE ENLIVE)  237 mL Oral BID BM  . gabapentin  100 mg Oral BID  . oxyCODONE  5 mg Oral Q6H  . polyethylene glycol  17 g Oral Daily  . senna  1 tablet Oral BID   Continuous Infusions:     Hosie Poisson, MD [facility] Pager [phone]  If 7PM-7AM, please contact night-coverage [email or web address] Password [email or web address] 04/09/2016, 3:32 PM

## 2016-04-10 NOTE — Progress Notes (Signed)
PROGRESS NOTE  Mikayla Marks HMC:[id number] DOB: Jun 07, 1928 DOA: 04/02/2016 PCP: Kathlene November, MD   LOS: 7 days   Brief Narrative: Mikayla Marks is a 82 y.o. female with a past medical history significant for paranoid schizophrenia and HTN who presents with back pain for 4 months. The patient was in her usual state of health until around mid-February when she went on a long car ride with her daughter and developed some back pain.  By April, this back pain had gotten much worse and she had a new severe kyphosis:  Mid April 2017: Seen at [place], Dr. Mina Marble PM&R, MRI spine ordered which showed "lesions within the T8 and T9 vertebral bodies with associated pathologic fractures and osseous retropulsion...worrisome for metastatic disease, myeloma, or lymphoma".  Was referred to Oncology, did not call back. 10/26/15: Saw PCP in follow up, was recommended to see Oncology, refused. 11/11/15: Saw Pulmonology as self-referral for pleural effusions (noted incidentally on MRI of spine that showed metastasis) was recommended to see Oncology, have PET and biopsy via IR, refused 12/08/15: Saw PCP again, SPEP ordered and no M-spike (only "band of restricted mobility" in beta globulins), again referral to Hematology-Oncology and also IR that week June 2017: Saw Orthopedic surgery who recommended biopsy, family and patient declined again  She was admitted this time for worsening back pain.  Bone marrow biopsy for evaluation of MM scheduled for Monday on 10/16.  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  She is currently not an operative candidate.  - palliative care consulted and recommendations given.  - oncology consulted, rad onc has seen the patient, pt was transferred to [facility] for radiation treatments.  - IR guided bone biopsy done, repeat biopsy scheduled on 10/16 for evaluation of MM.   - CURRENTLY she denies any new complaints. She reports pain is controlled. Son is OB/GYN MD and HCPOA, he was made aware of this plan     HTN Well controlled.   Paranoid schizophrenia - psychiatry consulted and recommendations given.   Prolonged QTc - Avoid QT prolonging agents  Social - pt wants to go home with home PT,.   DVT prophylaxis: SCD's Code Status: DNR Family Communication: none at bedside, discussed the plan of care with the patient.  Disposition Plan:  Pt wants to be discharged home, possibly after the biopsy.   Consultants:   Palliative  Neurosurgery  Oncology  Radiation Oncology  Psychiatry   Procedures:   IR guided biopsy 10/9, 10/13  Antimicrobials:  None    Subjective: Currently reports her pain is well controlled.  No new complaints by the patient.     Objective: Vitals:   04/09/16 0602 04/09/16 1500 04/09/16 2114 04/10/16 0529  BP: 134/71 134/62 (!) 110/56 140/69  Pulse: (!) 57 78 74 60  Resp: _0 Temp: 97.5 F (36.4 C) 98.6 F (37 C) 99.1 F (37.3 C) 97.9 F (36.6 C)  TempSrc: Oral Oral Oral Oral  SpO2: 96% 96% 94% 97%  Weight:  Height:        Intake/Output Summary (Last 24 hours) at 04/10/16 1444 Last data filed at 04/10/16 0803  Gross per 24 hour  Intake             1080 ml  Output              300 ml  Net              780 ml   Filed Weights   04/02/16 1404 04/02/16 2311  Weight: 73 kg (161 lb) 64 kg (141 lb)    Examination: Constitutional: NAD alert and comfortable.  Vitals:   04/09/16 0602 04/09/16 1500 04/09/16 2114 04/10/16 0529  BP: 134/71 134/62  (!) 110/56 140/69  Pulse: (!) 57 78 74 60  Resp: _0 Temp: 97.5 F (36.4 C) 98.6 F (37 C) 99.1 F (37.3 C) 97.9 F (36.6 C)  TempSrc: Oral Oral Oral Oral  SpO2: 96% 96% 94% 97%  Weight:      Height:       Respiratory: clear to auscultation bilaterally, no wheezing, no crackles.  Cardiovascular: Regular rate and rhythm, no murmurs / rubs / gallops. No LE edema.  Abdomen: no tenderness. Bowel sounds positive.  Musculoskeletal: no clubbing / cyanosis.  Skin: no rashes. Chronic venous stasis changes LE. Neurologic: non focal   Psychiatric: Normal judgment and insight. Alert and oriented x 3. Normal mood.   Data Reviewed: I have personally reviewed following labs and imaging studies  CBC:  Recent Labs Lab 04/06/16 0338  WBC 7.5  HGB 12.6  HCT 38.0  MCV 91.3  PLT 644   Basic Metabolic Panel:  Recent Labs Lab 04/06/16 0338 04/08/16 1014  NA 138 137  K 4.5 3.7  CL 103 104  CO2 26 24  GLUCOSE 120* 94  BUN 23* 28*  CREATININE 0.88 0.76  CALCIUM 8.9 9.1   GFR: Estimated Creatinine Clearance: 42.2 mL/min (by C-G formula based on SCr of 0.76 mg/dL). Liver Function Tests:  Recent Labs Lab 04/06/16 0338  AST 21  ALT 18  ALKPHOS 51  BILITOT 0.5  PROT 5.8*  ALBUMIN 3.1*   No results for input(s): LIPASE, AMYLASE in the last 168 hours. No results for input(s): AMMONIA in the last 168 hours. Coagulation Profile: No results for input(s): INR, PROTIME in the last 168 hours. Cardiac Enzymes: No results for input(s): CKTOTAL, CKMB, CKMBINDEX, TROPONINI in the last 168 hours. BNP (last 3 results)  Recent Labs  11/11/15 1623  PROBNP 68.0   Urine analysis:    Component Value Date/Time   COLORURINE YELLOW 04/02/2016 [place] 04/02/2016 1617   LABSPEC 1.010 04/02/2016 1617   PHURINE 6.0 04/02/2016 1617   GLUCOSEU NEGATIVE 04/02/2016 1617   HGBUR TRACE (A) 04/02/2016 1617   HGBUR trace 03/04/2008 1529   BILIRUBINUR NEGATIVE  04/02/2016 1617   KETONESUR 15 (A) 04/02/2016 1617   PROTEINUR NEGATIVE 04/02/2016 1617   UROBILINOGEN 0.2 10/23/2010 0505   NITRITE NEGATIVE 04/02/2016 1617   LEUKOCYTESUR NEGATIVE 04/02/2016 1617   Sepsis Labs: Invalid input(s): PROCALCITONIN, LACTICIDVEN  Recent Results (from the past 240 hour(s))  Urine culture     Status: None   Collection Time: 04/02/16  4:18 PM  Result Value Ref Range Status   Specimen Description URINE, CLEAN CATCH  Final   Special Requests NONE  Final   Culture NO GROWTH  Final   Report Status 04/03/2016 FINAL  Final    Radiology  Studies: No results found. Scheduled Meds: . cycloSPORINE  1 drop Both Eyes BID  . dexamethasone  4 mg Intravenous Q24H  . docusate sodium  100 mg Oral BID  . feeding supplement (ENSURE ENLIVE)  237 mL Oral BID BM  . gabapentin  100 mg Oral BID  . oxyCODONE  5 mg Oral Q6H  . polyethylene glycol  17 g Oral Daily  . senna  1 tablet Oral BID   Continuous Infusions:     Hosie Poisson, MD [facility] Pager [phone]  If 7PM-7AM, please contact night-coverage [email or web address] Password [email or web address] 04/10/2016, 2:44 PM

## 2016-04-11 ENCOUNTER — Ambulatory Visit
Admit: 2016-04-11 | Discharge: 2016-04-11 | Disposition: A | Discharge status: Home / Self Care | Payer: Medicare Other | Attending: Radiation Oncology | Admitting: Radiation Oncology

## 2016-04-11 ENCOUNTER — Ambulatory Visit: Payer: Medicare Other

## 2016-04-11 ENCOUNTER — Encounter (HOSPITAL_COMMUNITY): Payer: Self-pay | Admitting: Radiology

## 2016-04-11 ENCOUNTER — Inpatient Hospital Stay (HOSPITAL_COMMUNITY): Payer: Medicare Other

## 2016-04-11 LAB — CBC WITH DIFFERENTIAL/PLATELET
BASOS ABS: 0 10*3/uL (ref 0.0–0.1)
Basophils Relative: 0 %
EOS PCT: 0 %
Eosinophils Absolute: 0 10*3/uL (ref 0.0–0.7)
HCT: 36.1 % (ref 36.0–46.0)
HEMOGLOBIN: 12 g/dL (ref 12.0–15.0)
LYMPHS PCT: 12 %
Lymphs Abs: 0.9 10*3/uL (ref 0.7–4.0)
MCH: 29.9 pg (ref 26.0–34.0)
MCHC: 33.2 g/dL (ref 30.0–36.0)
MCV: 90 fL (ref 78.0–100.0)
Monocytes Absolute: 0.4 10*3/uL (ref 0.1–1.0)
Monocytes Relative: 5 %
NEUTROS ABS: 6.3 10*3/uL (ref 1.7–7.7)
NEUTROS PCT: 83 %
PLATELETS: 332 10*3/uL (ref 150–400)
RBC: 4.01 MIL/uL (ref 3.87–5.11)
RDW: 14.5 % (ref 11.5–15.5)
WBC: 7.6 10*3/uL (ref 4.0–10.5)

## 2016-04-11 LAB — BASIC METABOLIC PANEL
ANION GAP: 8 (ref 5–15)
BUN: 31 mg/dL — ABNORMAL HIGH (ref 6–20)
BUN: 31 mg/dL — AB (ref 4–21)
CHLORIDE: 104 mmol/L (ref 101–111)
CO2: 26 mmol/L (ref 22–32)
Calcium: 8.9 mg/dL (ref 8.9–10.3)
Creatinine, Ser: 0.94 mg/dL (ref 0.44–1.00)
Creatinine: 0.9 mg/dL (ref 0.5–1.1)
GFR calc Af Amer: >60 mL/min (ref >60)
GFR, EST NON AFRICAN AMERICAN: 53 mL/min — AB (ref >60)
GLUCOSE: 119 mg/dL
GLUCOSE: 119 mg/dL — AB (ref 65–99)
POTASSIUM: 4.3 mmol/L (ref 3.5–5.1)
SODIUM: 138 mmol/L (ref 137–147)
Sodium: 138 mmol/L (ref 135–145)

## 2016-04-11 LAB — CBC AND DIFFERENTIAL: WBC: 7.6 10^3/mL

## 2016-04-11 LAB — BONE MARROW EXAM

## 2016-04-11 MED ORDER — FENTANYL CITRATE (PF) 100 MCG/2ML IJ SOLN
INTRAMUSCULAR | Status: AC
  Filled 2016-04-11: qty 4

## 2016-04-11 MED ORDER — HYDROCODONE-ACETAMINOPHEN 5-325 MG PO TABS
1.0000 | ORAL_TABLET | ORAL | Status: DC | PRN
  Administered 2016-04-12: 1 via ORAL
  Filled 2016-04-11: qty 1

## 2016-04-11 MED ORDER — FENTANYL CITRATE (PF) 100 MCG/2ML IJ SOLN
INTRAMUSCULAR | Status: AC | PRN
  Administered 2016-04-11 (×3): 25 ug via INTRAVENOUS

## 2016-04-11 MED ORDER — FLUMAZENIL 0.5 MG/5ML IV SOLN
INTRAVENOUS | Status: AC
  Filled 2016-04-11: qty 5

## 2016-04-11 MED ORDER — NALOXONE HCL 0.4 MG/ML IJ SOLN
INTRAMUSCULAR | Status: AC
  Filled 2016-04-11: qty 1

## 2016-04-11 MED ORDER — MIDAZOLAM HCL 2 MG/2ML IJ SOLN
INTRAMUSCULAR | Status: AC | PRN
  Administered 2016-04-11 (×3): 0.5 mg via INTRAVENOUS

## 2016-04-11 MED ORDER — MIDAZOLAM HCL 2 MG/2ML IJ SOLN
INTRAMUSCULAR | Status: AC
  Filled 2016-04-11: qty 4

## 2016-04-11 NOTE — Progress Notes (Signed)
[facility] will continue to assist with patient disposition to SNF. Will follow up with Bed offers in the a.m. [place] on PASRR.

## 2016-04-11 NOTE — Progress Notes (Signed)
Biopsy site WDL, bandage CD and I.

## 2016-04-11 NOTE — Progress Notes (Signed)
PROGRESS NOTE  MARETA CHESNUT [id number] DOB: 11-Jun-1928 DOA: 04/02/2016 PCP: Kathlene November, MD   LOS: 8 days   Brief Narrative: Mikayla Marks is a 82 y.o. female with a past medical history significant for paranoid schizophrenia and HTN who presents with back pain for 4 months. The patient was in her usual state of health until around mid-February when she went on a long car ride with her daughter and developed some back pain.  By April, this back pain had gotten much worse and she had a new severe kyphosis:  Mid April 2017: Seen at [place], Dr. Mina Marble PM&R, MRI spine ordered which showed "lesions within the T8 and T9 vertebral bodies with associated pathologic fractures and osseous retropulsion...worrisome for metastatic disease, myeloma, or lymphoma".  Was referred to Oncology, did not call back. 10/26/15: Saw PCP in follow up, was recommended to see Oncology, refused. 11/11/15: Saw Pulmonology as self-referral for pleural effusions (noted incidentally on MRI of spine that showed metastasis) was recommended to see Oncology, have PET and biopsy via IR, refused 12/08/15: Saw PCP again, SPEP ordered and no M-spike (only "band of restricted mobility" in beta globulins), again referral to Hematology-Oncology and also IR that week June 2017: Saw Orthopedic surgery who recommended biopsy, family and patient declined again  She was admitted this time for worsening back pain.  Bone marrow biopsy for evaluation of MM scheduled done on 10/16, results pending.  10/17 plan for SNF when bed available.   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  She is currently not an operative candidate.  - palliative care consulted and recommendations given.  - oncology consulted, rad onc has seen the patient, pt was transferred to [facility] for radiation treatments.  - IR guided bone biopsy done, repeat biopsy done on 10/16 for evaluation of MM.   - CURRENTLY she denies any new complaints. She reports pain is controlled. Son is OB/GYN MD and [facility], he was made aware of this plan   - pending SNF placement.   HTN Well controlled.   Paranoid schizophrenia - psychiatry consulted and recommendations given.   Prolonged QTc - Avoid QT prolonging agents  Social - pt wants to go home with home PT,.  - discussed with patient's brother, who has convinced the patient to be discharged to SNF.   DVT prophylaxis: SCD's Code Status: DNR Family Communication: none at bedside, discussed the plan of care with the patient and patient's brother.  Disposition Plan:  D/C IN snf.   Consultants:   Palliative  Neurosurgery  Oncology  Radiation Oncology  Psychiatry   Procedures:   IR guided biopsy 10/9, 10/13  Antimicrobials:  None    Subjective: No complaints.     Objective: Vitals:   04/11/16 0945 04/11/16 1000 04/11/16 1049 04/11/16 1411  BP: (!) 156/73 140/71 (!) 154/83 (!) 140/56  Pulse: 85 74 83 84  Resp: 16 17 18 17   Temp: 97.5 F (36.4 C) 97.6 F (36.4 C) 97.6 F (36.4 C) 98.1 F (36.7 C)  TempSrc: Oral  Oral Oral Oral  SpO2: 95% 96% 98% 95%  Weight:      Height:        Intake/Output Summary (Last 24 hours) at 04/11/16 1724 Last data filed at 04/11/16 1411  Gross per 24 hour  Intake              500 ml  Output             1350 ml  Net             -850 ml   Filed Weights   04/02/16 1404 04/02/16 2311  Weight: 73 kg (161 lb) 64 kg (141 lb)    Examination: Constitutional: NAD alert and comfortable.  Vitals:     04/11/16 0945 04/11/16 1000 04/11/16 1049 04/11/16 1411  BP: (!) 156/73 140/71 (!) 154/83 (!) 140/56  Pulse: 85 74 83 84  Resp: 16 17 18 17   Temp: 97.5 F (36.4 C) 97.6 F (36.4 C) 97.6 F (36.4 C) 98.1 F (36.7 C)  TempSrc: Oral Oral Oral Oral  SpO2: 95% 96% 98% 95%  Weight:      Height:       Respiratory: clear to auscultation bilaterally, no wheezing, no crackles.  Cardiovascular: Regular rate and rhythm, no murmurs / rubs / gallops. No LE edema.  Abdomen: no tenderness. Bowel sounds positive.  Musculoskeletal: no clubbing / cyanosis.  Skin: no rashes. Chronic venous stasis changes LE. Neurologic: non focal   Psychiatric: Normal judgment and insight. Alert and oriented x 3. Normal mood.   Data Reviewed: I have personally reviewed following labs and imaging studies  CBC:  Recent Labs Lab 04/06/16 0338 04/11/16 0408  WBC 7.5 7.6  NEUTROABS  --  6.3  HGB 12.6 12.0  HCT 38.0 36.1  MCV 91.3 90.0  PLT 288 505   Basic Metabolic Panel:  Recent Labs Lab 04/06/16 0338 04/08/16 1014 04/11/16 0408  NA 138 137 138  K 4.5 3.7 4.3  CL 103 104 104  CO2 26 24 26   GLUCOSE 120* 94 119*  BUN 23* 28* 31*  CREATININE 0.88 0.76 0.94  CALCIUM 8.9 9.1 8.9   GFR: Estimated Creatinine Clearance: 35.9 mL/min (by C-G formula based on SCr of 0.94 mg/dL). Liver Function Tests:  Recent Labs Lab 04/06/16 0338  AST 21  ALT 18  ALKPHOS 51  BILITOT 0.5  PROT 5.8*  ALBUMIN 3.1*   No results for input(s): LIPASE, AMYLASE in the last 168 hours. No results for input(s): AMMONIA in the last 168 hours. Coagulation Profile: No results for input(s): INR, PROTIME in the last 168 hours. Cardiac Enzymes: No results for input(s): CKTOTAL, CKMB, CKMBINDEX, TROPONINI in the last 168 hours. BNP (last 3 results)  Recent Labs  11/11/15 1623  PROBNP 68.0   Urine analysis:    Component Value Date/Time   COLORURINE YELLOW 04/02/2016 [place] 04/02/2016 1617    LABSPEC 1.010 04/02/2016 1617   PHURINE 6.0 04/02/2016 1617   GLUCOSEU NEGATIVE 04/02/2016 1617   HGBUR TRACE (A) 04/02/2016 1617   HGBUR trace 03/04/2008 1529   BILIRUBINUR NEGATIVE 04/02/2016 1617   KETONESUR 15 (A) 04/02/2016 1617   PROTEINUR NEGATIVE 04/02/2016 1617   UROBILINOGEN 0.2 10/23/2010 0505   NITRITE NEGATIVE 04/02/2016 1617   LEUKOCYTESUR NEGATIVE 04/02/2016 1617   Sepsis Labs: Invalid input(s): PROCALCITONIN, LACTICIDVEN  Recent Results (from the past 240 hour(s))  Urine culture     Status: None   Collection Time: 04/02/16  4:18 PM  Result Value Ref Range Status   Specimen Description URINE, CLEAN CATCH  Final   Special Requests NONE  Final   Culture NO GROWTH  Final   Report Status 04/03/2016 FINAL  Final    Radiology Studies: Ct Biopsy  Result Date: May 09, 2016 INDICATION: 82 year old female with a recently diagnosed T8/T9 plasmacytoma. Bone marrow biopsy is warranted to evaluate for marrow involvement. EXAM: CT GUIDED BONE MARROW ASPIRATION AND CORE BIOPSY Interventional Radiologist:  Criselda Peaches, MD MEDICATIONS: None. ANESTHESIA/SEDATION: Moderate (conscious) sedation was employed during this procedure. A total of 1.5 milligrams versed and 75 micrograms fentanyl were administered intravenously. The patient's level of consciousness and vital signs were monitored continuously by radiology nursing throughout the procedure under my direct supervision. Total monitored sedation time: 10 minutes FLUOROSCOPY TIME:  Fluoroscopy Time: 0 minutes 0 seconds (0 mGy). COMPLICATIONS: None immediate. Estimated blood loss: <25 mL PROCEDURE: Informed written consent was obtained from the patient after a thorough discussion of the procedural risks, benefits and alternatives. All questions were addressed. Maximal Sterile Barrier Technique was utilized including caps, mask, sterile gowns, sterile gloves, sterile drape, hand hygiene and skin antiseptic. A timeout was performed prior  to the initiation of the procedure. The patient was positioned prone and non-contrast localization CT was performed of the pelvis to demonstrate the iliac marrow spaces. Maximal barrier sterile technique utilized including caps, mask, sterile gowns, sterile gloves, large sterile drape, hand hygiene, and betadine prep. Under sterile conditions and local anesthesia, an 11 gauge coaxial bone biopsy needle was advanced into the right iliac marrow space. Needle position was confirmed with CT imaging. Initially, bone marrow aspiration was performed. Next, the 11 gauge outer cannula was utilized to obtain a right iliac bone marrow core biopsy. Needle was removed. Hemostasis was obtained with compression. The patient tolerated the procedure well. Samples were prepared with the cytotechnologist. IMPRESSION: Technically successful CT-guided bone marrow biopsy of the right iliac bone. Signed, Criselda Peaches, MD [facility] Radiology Electronically Signed   By: Jacqulynn Cadet M.D.   On: May 09, 2016 09:53   Scheduled Meds: . cycloSPORINE  1 drop Both Eyes BID  . dexamethasone  4 mg Intravenous Q24H  . docusate sodium  100 mg Oral BID  . feeding supplement (ENSURE ENLIVE)  237 mL Oral BID BM  . fentaNYL      . flumazenil      . gabapentin  100 mg Oral BID  . midazolam      . naloxone      . oxyCODONE  5 mg Oral Q6H  . polyethylene glycol  17 g Oral Daily  . senna  1 tablet Oral BID   Continuous Infusions:     Hosie Poisson, MD [facility] Pager [phone]  If 7PM-7AM, please contact night-coverage [email or web address] Password [email or web address] May 09, 2016, 5:24 PM

## 2016-04-11 NOTE — Progress Notes (Signed)
Physical Therapy Treatment Patient Details Name: Mikayla Marks MRN: [id number] DOB: Apr 12, 1928 Today's Date: 04/11/2016    History of Present Illness 82 y.o. female admitted with back pain and now found to have thoracic spine bone mass T7-10. To undergo biopsy. PMH: schizoaffective disorder, carcinoma, osteoporosis, HTN.    PT Comments    Due to extended hospital stay and need for scheduled pain meds to manage back pain, pt will need ST Rehab at SNF as she does not have 12/7 assist at home.  Will consult LPT current status.   Follow Up Recommendations  SNF (pt will not have 24/7 assist at home so now needs SNF)     Equipment Recommendations       Recommendations for Other Services       Precautions / Restrictions Precautions Precautions: Fall Precaution Comments:  severe thoratic kyphosis    Mobility  Bed Mobility Overal bed mobility: Needs Assistance Bed Mobility: Supine to Sit;Sit to Supine   Sidelying to sit: Min assist Supine to sit: Mod assist Sit to supine: Mod assist   General bed mobility comments: required assist B LE and unable to self perform as prior to admission  Transfers Overall transfer level: Needs assistance Equipment used: Rolling walker (2 wheeled) Transfers: Sit to/from Stand Sit to Stand: Min assist         General transfer comment: assist to rise and stabilize with 25% VC's on proper hand placement and safety with turns  Ambulation/Gait Ambulation/Gait assistance: Min guard;Min assist Ambulation Distance (Feet): 62 Feet Assistive device: Rolling walker (2 wheeled) Gait Pattern/deviations: Step-to pattern;Step-through pattern;Trunk flexed Gait velocity: decreased   General Gait Details: unsteady gait with poor posture and poor self self corrective response to mid line.  HIGH FALL RISK   Stairs            Wheelchair Mobility    Modified Rankin (Stroke Patients Only)       Balance                                     Cognition Arousal/Alertness: Awake/alert Behavior During Therapy: WFL for tasks assessed/performed Overall Cognitive Status: Within Functional Limits for tasks assessed                      Exercises      General Comments        Pertinent Vitals/Pain Pain Assessment: Faces Faces Pain Scale: Hurts little more Pain Location: neck and back with scheduled pain meds needed Pain Descriptors / Indicators: Grimacing Pain Intervention(s): Premedicated before session;Repositioned    Home Living                      Prior Function            PT Goals (current goals can now be found in the care plan section) Progress towards PT goals: Progressing toward goals    Frequency    Min 3X/week      PT Plan Discharge plan needs to be updated    Co-evaluation             End of Session Equipment Utilized During Treatment: Gait belt Activity Tolerance: Patient limited by fatigue;Patient limited by pain Patient left: in bed;with call bell/phone within reach;with family/visitor present     Time: [id number] PT Time Calculation (min) (ACUTE ONLY): 27 min  Charges:  $Gait Training: 8-22 mins $  Therapeutic Activity: 8-22 mins                    G Codes:     Rica Koyanagi  PTA [facility]  Acute  Rehab Pager      [phone]

## 2016-04-11 NOTE — Procedures (Signed)
Interventional Radiology Procedure Note  Procedure: CT guided aspirate and core biopsy of right iliac bone Complications: None Recommendations: - Bedrest supine x 1 hrs - Hydrocodone PRN  Pain - Follow biopsy results  Signed,  Jenai Scaletta K. Agnes Probert, MD   

## 2016-04-11 NOTE — Progress Notes (Signed)
Biopsy site still WDL, dressing is clean, dry and intact. Thomasene Lot, RN

## 2016-04-11 NOTE — Progress Notes (Signed)
Biopsy sight checked it is  WDL. Bandage is clean dry and intact. Thomasene Lot, RN

## 2016-04-11 NOTE — NC FL2 (Signed)
[place] MEDICAID FL2 LEVEL OF CARE SCREENING TOOL     IDENTIFICATION  Patient Name: Mikayla Marks Birthdate: Dec 28, 1927 Sex: female Admission Date (Current Location): 04/02/2016  [facility] [place] and [place] Number:  Herbalist and Address:  [facility],  [place] [address] [place]      Provider Number: [id number]  Attending Physician Name and Address:  Hosie Poisson, MD  Relative Name and Phone Number:       Current Level of Care: SNF Recommended Level of Care: [place] Prior Approval Number:    Date Approved/Denied:   PASRR Number:    Discharge Plan: SNF    Current Diagnoses: Patient Active Problem List   Diagnosis Date Noted  . Spinal cord compression ([place])   . Prolonged QT interval 04/03/2016  . Palliative care encounter   . Intractable back pain 04/02/2016  . Plasmacytoma ([place]) 04/02/2016  . Pathologic vertebral fracture 11/12/2015  . Dyspnea 11/11/2015  . Pleural effusion, bilateral 11/11/2015  . PCP NOTES >>>>>>>>>>>>>>>>>>>>>>>>>>>>>>>> 10/26/2015  . Hammer toe of left foot 01/30/2015  . Bilateral leg edema 09/10/2014  . Onychomycosis 05/19/2014  . Pain in lower limb 05/19/2014  . Fatigue 02/11/2014  . Medicare annual wellness visit, subsequent 09/02/2013  . Encounter for general adult medical examination without abnormal findings 09/02/2013  . Pain in joint, foot, left 07/10/2013  . Corn of toe 02/04/2013  . Varicose veins of lower extremities with inflammation 11/20/2012  . Varicose veins of lower extremities with other complications [id number]  . DEGENERATIVE JOINT DISEASE 01/12/2010  . Schizophrenia ([place]) 06/30/2009  . SKIN CANCER, LEG 05/29/2009  . Cancer of skin of leg 05/29/2009  . OSTEOPOROSIS 08/26/2008  . HYPERLIPIDEMIA 10/16/2006  . Essential hypertension 10/16/2006    Orientation RESPIRATION BLADDER Height & Weight     Self, Time, Situation, Place  Normal Continent Weight: 141 lb (64  kg) Height:  5' (152.4 cm)  BEHAVIORAL SYMPTOMS/MOOD NEUROLOGICAL BOWEL NUTRITION STATUS      Continent Diet  AMBULATORY STATUS COMMUNICATION OF NEEDS Skin   Limited Assist Verbally Normal                       Personal Care Assistance Level of Assistance  Bathing, Feeding, Dressing Bathing Assistance: Limited assistance Feeding assistance: Independent Dressing Assistance: Limited assistance     Functional Limitations Info  Sight, Hearing, Speech Sight Info: Adequate Hearing Info: Adequate Speech Info: Adequate    SPECIAL CARE FACTORS FREQUENCY  PT (By licensed PT)     PT Frequency: 5              Contractures Contractures Info: Not present    Additional Factors Info  Code Status, Allergies, Psychotropic Code Status Info: DNR Allergies Info: Yes           Current Medications (04/11/2016):  This is the current hospital active medication list Current Facility-Administered Medications  Medication Dose Route Frequency Provider Last Rate Last Dose  . acetaminophen (TYLENOL) tablet 650 mg  650 mg Oral Q6H PRN Edwin Dada, MD       Or  . acetaminophen (TYLENOL) suppository 650 mg  650 mg Rectal Q6H PRN Edwin Dada, MD      . cycloSPORINE (RESTASIS) 0.05 % ophthalmic emulsion 1 drop  1 drop Both Eyes BID Hosie Poisson, MD   1 drop at 04/11/16 1003  . dexamethasone (DECADRON) injection 4 mg  4 mg Intravenous Q24H Melton Alar, PA-C  4 mg at 04/10/16 1727  . docusate sodium (COLACE) capsule 100 mg  100 mg Oral BID Edwin Dada, MD   100 mg at 04/11/16 1003  . feeding supplement (ENSURE ENLIVE) (ENSURE ENLIVE) liquid 237 mL  237 mL Oral BID BM Hosie Poisson, MD   237 mL at 04/09/16 1000  . fentaNYL (SUBLIMAZE) 100 MCG/2ML injection           . flumazenil (ROMAZICON) 0.5 MG/5ML injection           . gabapentin (NEURONTIN) capsule 100 mg  100 mg Oral BID Ambrose Finland, MD   100 mg at 04/11/16 1003  . HYDROcodone-acetaminophen  (NORCO/VICODIN) 5-325 MG per tablet 1-2 tablet  1-2 tablet Oral Q4H PRN Jacqulynn Cadet, MD      . methocarbamol (ROBAXIN) tablet 500 mg  500 mg Oral Q8H PRN Caren Griffins, MD   500 mg at 04/08/16 0844  . midazolam (VERSED) 2 MG/2ML injection           . naloxone (NARCAN) 0.4 MG/ML injection           . oxyCODONE (Oxy IR/ROXICODONE) immediate release tablet 5 mg  5 mg Oral Q4H PRN Edwin Dada, MD   5 mg at 04/10/16 1924  . oxyCODONE (Oxy IR/ROXICODONE) immediate release tablet 5 mg  5 mg Oral Q6H Dory Horn, NP   5 mg at 04/11/16 1219  . polyethylene glycol (MIRALAX / GLYCOLAX) packet 17 g  17 g Oral Daily Juliet Rude, MD   17 g at 04/11/16 1003  . senna (SENOKOT) tablet 8.6 mg  1 tablet Oral BID Edwin Dada, MD   8.6 mg at 04/11/16 1003     Discharge Medications: Please see discharge summary for a list of discharge medications.  Relevant Imaging Results:  Relevant Lab Results:   Additional Information SS#[id number]  Trayon Krantz A Aleksa Catterton, LCSW

## 2016-04-11 NOTE — Progress Notes (Signed)
This CM left message with son to discuss pt discharge today and to inquire where pt would be going. CM will continue to follow. Marney Doctor RN,BSN,NCM [phone]

## 2016-04-12 ENCOUNTER — Ambulatory Visit: Payer: Medicare Other

## 2016-04-12 ENCOUNTER — Ambulatory Visit
Admit: 2016-04-12 | Discharge: 2016-04-12 | Disposition: A | Discharge status: Home / Self Care | Payer: Medicare Other | Attending: Radiation Oncology | Admitting: Radiation Oncology

## 2016-04-12 DIAGNOSIS — M810 Age-related osteoporosis without current pathological fracture: Secondary | ICD-10-CM | POA: Diagnosis present

## 2016-04-12 DIAGNOSIS — C9030 Solitary plasmacytoma not having achieved remission: Secondary | ICD-10-CM | POA: Diagnosis not present

## 2016-04-12 DIAGNOSIS — M62838 Other muscle spasm: Secondary | ICD-10-CM | POA: Diagnosis not present

## 2016-04-12 DIAGNOSIS — M549 Dorsalgia, unspecified: Secondary | ICD-10-CM | POA: Diagnosis not present

## 2016-04-12 DIAGNOSIS — I10 Essential (primary) hypertension: Secondary | ICD-10-CM | POA: Diagnosis not present

## 2016-04-12 DIAGNOSIS — I4581 Long QT syndrome: Secondary | ICD-10-CM | POA: Diagnosis present

## 2016-04-12 DIAGNOSIS — M8448XD Pathological fracture, other site, subsequent encounter for fracture with routine healing: Secondary | ICD-10-CM | POA: Diagnosis not present

## 2016-04-12 DIAGNOSIS — H04123 Dry eye syndrome of bilateral lacrimal glands: Secondary | ICD-10-CM | POA: Diagnosis not present

## 2016-04-12 DIAGNOSIS — R9431 Abnormal electrocardiogram [ECG] [EKG]: Secondary | ICD-10-CM | POA: Diagnosis not present

## 2016-04-12 DIAGNOSIS — M8448XA Pathological fracture, other site, initial encounter for fracture: Secondary | ICD-10-CM | POA: Diagnosis present

## 2016-04-12 DIAGNOSIS — Z515 Encounter for palliative care: Secondary | ICD-10-CM | POA: Diagnosis present

## 2016-04-12 DIAGNOSIS — F200 Paranoid schizophrenia: Secondary | ICD-10-CM | POA: Diagnosis not present

## 2016-04-12 DIAGNOSIS — Z880 Allergy status to penicillin: Secondary | ICD-10-CM | POA: Diagnosis not present

## 2016-04-12 DIAGNOSIS — C7951 Secondary malignant neoplasm of bone: Secondary | ICD-10-CM | POA: Diagnosis not present

## 2016-04-12 DIAGNOSIS — M546 Pain in thoracic spine: Secondary | ICD-10-CM | POA: Diagnosis not present

## 2016-04-12 DIAGNOSIS — E785 Hyperlipidemia, unspecified: Secondary | ICD-10-CM | POA: Diagnosis present

## 2016-04-12 DIAGNOSIS — Z23 Encounter for immunization: Secondary | ICD-10-CM | POA: Diagnosis not present

## 2016-04-12 DIAGNOSIS — Z79899 Other long term (current) drug therapy: Secondary | ICD-10-CM | POA: Diagnosis not present

## 2016-04-12 DIAGNOSIS — M8458XS Pathological fracture in neoplastic disease, other specified site, sequela: Secondary | ICD-10-CM | POA: Diagnosis not present

## 2016-04-12 DIAGNOSIS — L89100 Pressure ulcer of unspecified part of back, unstageable: Secondary | ICD-10-CM | POA: Diagnosis not present

## 2016-04-12 DIAGNOSIS — Z85828 Personal history of other malignant neoplasm of skin: Secondary | ICD-10-CM | POA: Diagnosis not present

## 2016-04-12 DIAGNOSIS — G893 Neoplasm related pain (acute) (chronic): Secondary | ICD-10-CM | POA: Diagnosis not present

## 2016-04-12 DIAGNOSIS — G9520 Unspecified cord compression: Secondary | ICD-10-CM | POA: Diagnosis not present

## 2016-04-12 DIAGNOSIS — M8000XS Age-related osteoporosis with current pathological fracture, unspecified site, sequela: Secondary | ICD-10-CM | POA: Diagnosis not present

## 2016-04-12 DIAGNOSIS — R2681 Unsteadiness on feet: Secondary | ICD-10-CM | POA: Diagnosis not present

## 2016-04-12 DIAGNOSIS — R279 Unspecified lack of coordination: Secondary | ICD-10-CM | POA: Diagnosis present

## 2016-04-12 DIAGNOSIS — M40209 Unspecified kyphosis, site unspecified: Secondary | ICD-10-CM | POA: Diagnosis present

## 2016-04-12 DIAGNOSIS — Z510 Encounter for antineoplastic radiation therapy: Secondary | ICD-10-CM | POA: Diagnosis not present

## 2016-04-12 DIAGNOSIS — L89103 Pressure ulcer of unspecified part of back, stage 3: Secondary | ICD-10-CM | POA: Diagnosis not present

## 2016-04-12 DIAGNOSIS — R5383 Other fatigue: Secondary | ICD-10-CM | POA: Diagnosis present

## 2016-04-12 DIAGNOSIS — M6281 Muscle weakness (generalized): Secondary | ICD-10-CM | POA: Diagnosis not present

## 2016-04-12 DIAGNOSIS — Z66 Do not resuscitate: Secondary | ICD-10-CM | POA: Diagnosis present

## 2016-04-12 DIAGNOSIS — C801 Malignant (primary) neoplasm, unspecified: Secondary | ICD-10-CM | POA: Diagnosis present

## 2016-04-12 MED ORDER — OXYCODONE HCL 5 MG PO TABS: 5.0000 mg | ORAL_TABLET | ORAL | 0 refills | Status: DC | PRN

## 2016-04-12 MED ORDER — DEXAMETHASONE 4 MG PO TABS: 4.0000 mg | ORAL_TABLET | Freq: Every day | ORAL | 0 refills | Status: DC

## 2016-04-12 MED ORDER — SENNA 8.6 MG PO TABS: 1.0000 | ORAL_TABLET | Freq: Two times a day (BID) | ORAL | 0 refills | Status: AC

## 2016-04-12 MED ORDER — GABAPENTIN 100 MG PO CAPS: 100.0000 mg | ORAL_CAPSULE | Freq: Two times a day (BID) | ORAL | Status: DC

## 2016-04-12 MED ORDER — POLYETHYLENE GLYCOL 3350 17 G PO PACK: 17.0000 g | PACK | Freq: Every day | ORAL | 0 refills | Status: DC

## 2016-04-12 MED ORDER — METHOCARBAMOL 500 MG PO TABS: 500.0000 mg | ORAL_TABLET | Freq: Three times a day (TID) | ORAL | Status: DC | PRN

## 2016-04-12 MED ORDER — ENSURE ENLIVE PO LIQD: 237.0000 mL | Freq: Two times a day (BID) | ORAL | 12 refills | Status: DC

## 2016-04-12 MED ORDER — ACETAMINOPHEN-CODEINE #3 300-30 MG PO TABS: 1.0000 | ORAL_TABLET | Freq: Three times a day (TID) | ORAL | 0 refills | Status: DC | PRN

## 2016-04-12 MED ORDER — DOCUSATE SODIUM 100 MG PO CAPS
100.0000 mg | ORAL_CAPSULE | Freq: Two times a day (BID) | ORAL | 0 refills | Status: DC
Start: 2016-04-12 — End: 2017-02-03

## 2016-04-12 NOTE — Discharge Summary (Addendum)
Physician Discharge Summary  Mikayla Marks YKD:[id number] DOB: 1927/08/24 DOA: 04/02/2016  PCP: Kathlene November, MD  Admit date: 04/02/2016 Discharge date: 04/12/2016  Admitted From: HOME.  Disposition:  SNF  Recommendations for Outpatient Follow-up:  1. Follow up with PCP in 1-2 weeks 2. Please obtain BMP/CBC in one week 3. Please follow up with Dr Burr Medico regarding the bone marrow biopsy results.  4. Please follow up with Radiation oncology , to complete the rest of the treatments.  5. Please follow up with psychiatrist as needed.     Discharge Condition:guarded.  CODE STATUS:DNR Diet recommendation: regular.  Brief/Interim Summary: Mikayla Wnuk Wolfgramis a 82 y.o.femalewith a past medical history significant for paranoid schizophrenia and HTNwho presents with back pain for 4 months. The patient was in her usual state of health until around mid-February when she went on a long car ride with her daughter and developed some back pain. By April, this back pain had gotten much worse and she had a new severe kyphosis:  Mid April 2017: Seen at [place], Dr. Mina Marble PM&R, MRI spine ordered which showed "lesions within the T8 and T9 vertebral bodies with associated pathologic fractures and osseous retropulsion...worrisome for metastatic disease, myeloma, or lymphoma". Was referred to Oncology, did not call back. 10/26/15: Saw PCP in follow up, was recommended to see Oncology, refused. 11/11/15: Saw Pulmonology as self-referral for pleural effusions (noted incidentally on MRI of spine that showed metastasis) was recommended to see Oncology, have PET and biopsy via IR, refused 12/08/15: Saw PCP again, SPEP ordered and no M-spike (only "band of restricted mobility" in beta globulins), again referral to Hematology-Oncology and also IR that week June 2017: Saw Orthopedic surgery who recommended biopsy, family and patient declined again  She was admitted this time for worsening back pain and  transferred to [facility] hospital for initiation of radiation treatments.  Bone marrow biopsy for evaluation of MM done on 10/16, results pending.  10/17 plan for SNF when bed available.   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  - This was diagnosed in April, but because of shared delusions of patient and her daughter with whom she lives, biopsy was not pursued and palliative treatment has been delayed. She is currently not an operative candidate.  - palliative care consulted and recommendations given.  - oncology consulted, rad onc has seen the patient, pt was transferred to [facility] for radiation treatments.  - IR guided bone biopsy done, repeat biopsy done on 10/16 for evaluation of MM.   - CURRENTLY she denies any new complaints. She reports pain is controlled. Son is OB/GYN MD and [facility], he was made aware of this plan   - plan for SNF when bed available, and she can follow up with Dr Burr Medico for bone marrow biopsy results and Dr Tammi Klippel for radiation treatments.  HTN Well controlled.   Paranoid schizophrenia - psychiatry consulted and recommendations given.   Prolonged QTc - Avoid QT prolonging agents  Social SNF on discharge.    Discharge Instructions     Medication List    STOP taking these medications   acetaminophen-codeine 300-30 MG tablet Commonly known as:  TYLENOL #3  PROBIOTIC PO     TAKE these medications   CALTRATE 600 PO Take 1 tablet by mouth daily.   cycloSPORINE 0.05 % ophthalmic emulsion Commonly known as:  RESTASIS Place 1 drop into both eyes 2 (two) times daily.   dexamethasone 4 MG tablet Commonly  known as:  DECADRON Take 1 tablet (4 mg total) by mouth daily.   docusate sodium 100 MG capsule Commonly known as:  COLACE Take 1 capsule (100 mg total) by mouth 2 (two) times daily.   feeding supplement (ENSURE ENLIVE) Liqd Take 237 mLs by mouth 2 (two) times daily between meals.   gabapentin 100 MG capsule Commonly known as:  NEURONTIN Take 1 capsule (100 mg total) by mouth 2 (two) times daily.   methocarbamol 500 MG tablet Commonly known as:  ROBAXIN Take 1 tablet (500 mg total) by mouth every 8 (eight) hours as needed for muscle spasms.   multivitamin with minerals tablet Take 1 tablet by mouth 4 (four) times a week.   oxyCODONE 5 MG immediate release tablet Commonly known as:  Oxy IR/ROXICODONE Take 1 tablet (5 mg total) by mouth every 4 (four) hours as needed for moderate pain.   polyethylene glycol packet Commonly known as:  MIRALAX / GLYCOLAX Take 17 g by mouth daily. Start taking on:  04/13/2016   SALONPAS EX Apply 1 patch topically as needed (for pain).   senna 8.6 MG Tabs tablet Commonly known as:  SENOKOT Take 1 tablet (8.6 mg total) by mouth 2 (two) times daily.   tiZANidine 4 MG tablet Commonly known as:  ZANAFLEX Take 4-8 mg by mouth daily as needed for muscle spasms.      Contact information for after-discharge care    Destination    [facility] .   Specialty:  [place] information: [place] [phone]             Allergies  Allergen Reactions  . Other Other (See Comments)    5FU cream - stroke like symptoms  . Bacitracin-Polymyxin B Other (See Comments)    Reaction unknown.   Marland Kitchen Penicillins Rash    Consultations:  Oncology  Radiation oncology  Psychiatry.    Procedures/Studies: Dg Chest 2 View  Result Date: 04/02/2016 CLINICAL DATA:  Severe back pain.  Left-sided chest pain today. EXAM: CHEST  2 VIEW COMPARISON:  Chest radiograph  11/11/2015, spine MRI 10/15/2015 FINDINGS: The cardiac silhouette is enlarged. Mediastinal contours appear intact. There is no evidence of pneumothorax. There has been interval development of moderate in size left pleural effusion. Smaller right subpulmonic effusion is also possible. There is mild interstitial prominence bilaterally. If there is a significantly exaggerated thoracic kyphosis centered in the second third of the thoracic spine. The thoracic vertebrae are not well evaluated due to osteopenia and positioning, but new or advanced compression fracture is suspected based on the exaggerated kyphotic deformity. IMPRESSION: Moderate in size left pleural effusion. Possible small right subpulmonic pleural effusion. Mild pulmonary vascular congestion. Significantly exaggerated thoracic kyphosis when compared to patient's prior chest radiograph and MRI,which is suggestive of new or worsening compression fracture of one or more thoracic vertebral bodies.The thoracic vertebrae are not well evaluated due to osteopenia and positioning. Patient has a known T8, T9, and T11 expansile lesions with associated pathologic fractures of T8 and T9 demonstrated by MRI in April 2017. Electronically Signed   By: Fidela Salisbury M.D.   On: 04/02/2016 17:42  Ct Head Wo Contrast  Result Date: 04/02/2016 CLINICAL DATA:  Severe back pain. Compression fracture diagnosed in April. Pain is worsening today. EXAM: CT HEAD WITHOUT CONTRAST CT CERVICAL SPINE WITHOUT CONTRAST TECHNIQUE: Multidetector CT imaging of the head and cervical spine was performed following the standard protocol without intravenous contrast. Multiplanar CT image reconstructions of the cervical spine were also generated. COMPARISON:  CT head 11/30/2006 FINDINGS: CT HEAD FINDINGS Brain: Mild diffuse cerebral atrophy. Ventricular dilatation consistent with central atrophy. Low-attenuation changes in the deep white matter consistent with small vessel ischemia. No  evidence of acute infarction, hemorrhage, hydrocephalus, extra-axial collection or mass lesion/mass effect. Vascular: No hyperdense vessel or unexpected calcification. Skull: Normal. Negative for fracture or focal lesion. Sinuses/Orbits: No acute finding. Degenerative changes in the temporomandibular joints. Other: No significant changes since previous study. CT CERVICAL SPINE FINDINGS Alignment: Slight anterior subluxation of C7 on T1 is likely degenerative. Otherwise normal alignment of the cervical spine. Skull base and vertebrae: No acute fracture. No primary bone lesion or focal pathologic process. Soft tissues and spinal canal: No prevertebral fluid or swelling. No visible canal hematoma. Disc levels: Degenerative changes throughout the cervical spine with narrowed interspaces and endplate hypertrophic changes. Degenerative disc disease at C5-6 and C6-7 levels. Prominent posterior osteophytes at C5-6 and C6-7 cause some impression upon the central canal. C1-2 articulation appears intact. Congenital nonunion of the posterior arch of C1. Degenerative changes throughout the cervical facet joints with posterior coalition at C4-5. Upper chest: There is a fracture of the posterior right first rib but associated with a mildly expansile lucent lesions suggesting a pathologic fracture. Appearance is suspicious for metastasis. Other: None. IMPRESSION: Diffuse degenerative changes throughout the cervical spine. No acute displaced cervical spine fractures identified. There is a pathologic fracture of the right posterior first rib. Electronically Signed   By: Lucienne Capers M.D.   On: 04/02/2016 20:01   Ct Chest W Contrast  Result Date: 04/02/2016 CLINICAL DATA:  Pt reports to the ED for eval of back pain. Pt had some compression fx diagnosed in April. The pain got worse last night and into today. Pt able to move all extremities. Pt denies any numbness, tingling, paralysis, or bowel or bladder incontinence EXAM: CT  CHEST, ABDOMEN, AND PELVIS WITH CONTRAST TECHNIQUE: Multidetector CT imaging of the chest, abdomen and pelvis was performed following the standard protocol during bolus administration of intravenous contrast. CONTRAST:  100 mL of Isovue-300 intravenous contrast COMPARISON:  Chest CT, 08/22/2014 FINDINGS: CT CHEST FINDINGS Cardiovascular: Heart is normal in size. There are mild coronary artery calcifications. Great vessels normal in caliber. Mild atherosclerotic plaque is noted along the aortic arch and descending thoracic aorta. No significant stenosis. Mediastinum/Nodes: 14 mm nodule lies in the posterior left thyroid lobe, stable. No mediastinal or hilar masses or enlarged lymph nodes. Lungs/Pleura: Small left a minimal right pleural effusions. There is dependent opacity at the lung bases, most notably in the left lower lobe. This is likely atelectasis. Pneumonia is possible. There is no evidence of pulmonary edema. No lung mass or suspicious nodule. No pneumothorax. CT ABDOMEN PELVIS FINDINGS Hepatobiliary: Liver is unremarkable. There is intra and extra hepatic bile duct dilation. Common bile duct measures 12 mm in greatest dimension. No convincing duct stone. Gallbladder surgically absent. Pancreas: No pancreatic mass or inflammation. Mild dilation of the pancreatic duct. Spleen: Normal in size without focal abnormality. Adrenals/Urinary Tract: Mild bilateral renal cortical thinning. No renal masses or stones. No hydronephrosis. Ureters normal in course and in  caliber. Bladder is unremarkable. Stomach/Bowel: Stomach and small bowel are unremarkable. Mild increased stool in the colon. No colonic wall thickening or inflammatory change. Appendix not visualized. No evidence of appendicitis. Vascular/Lymphatic: Mild atherosclerotic calcifications noted along a normal caliber abdominal aorta. No enlarged lymph nodes. Reproductive: Status post hysterectomy. No adnexal masses. Other: No abdominal wall hernia or  abnormality. No abdominopelvic ascites. MUSCULOSKELETAL FINDINGS There is a destructive mass arising from the T8 and T9 vertebrae extending to involve the lower aspect of the T7 vertebra and the upper endplate of U72 as well. Mass measures 4.1 x 5.1 x 4.9 cm in size. It extends posteriorly into the spinal canal to cause severe central spinal stenosis. It also extends into the neural foramina, right greater than left causing significant narrowing. There are no other bone lesions. The bones are diffusely demineralized. IMPRESSION: 1. There is a destructive spinal mass centered on the T8 and T9 vertebrae, but also involving the lower aspect of the T7 vertebra and upper aspect of T10. The mass causes severe central stenosis and significant narrowing of the neural foramina. This mass may reflect a primary bone neoplasm such as a plasmacytoma. A metastatic lesion is possible although a primary neoplasm is not visualized. Followup thoracic spine MRI with and without contrast to assess the extent of cord compression is recommended. 2. No other bone lesions. 3. Small left and minimal right pleural effusions. There is associated dependent atelectasis most evident in the left lower lobe. 4. No acute abnormalities below the diaphragm. Electronically Signed   By: Lajean Manes M.D.   On: 04/02/2016 20:10   Ct Cervical Spine Wo Contrast  Result Date: 04/02/2016 CLINICAL DATA:  Severe back pain. Compression fracture diagnosed in April. Pain is worsening today. EXAM: CT HEAD WITHOUT CONTRAST CT CERVICAL SPINE WITHOUT CONTRAST TECHNIQUE: Multidetector CT imaging of the head and cervical spine was performed following the standard protocol without intravenous contrast. Multiplanar CT image reconstructions of the cervical spine were also generated. COMPARISON:  CT head 11/30/2006 FINDINGS: CT HEAD FINDINGS Brain: Mild diffuse cerebral atrophy. Ventricular dilatation consistent with central atrophy. Low-attenuation changes in the  deep white matter consistent with small vessel ischemia. No evidence of acute infarction, hemorrhage, hydrocephalus, extra-axial collection or mass lesion/mass effect. Vascular: No hyperdense vessel or unexpected calcification. Skull: Normal. Negative for fracture or focal lesion. Sinuses/Orbits: No acute finding. Degenerative changes in the temporomandibular joints. Other: No significant changes since previous study. CT CERVICAL SPINE FINDINGS Alignment: Slight anterior subluxation of C7 on T1 is likely degenerative. Otherwise normal alignment of the cervical spine. Skull base and vertebrae: No acute fracture. No primary bone lesion or focal pathologic process. Soft tissues and spinal canal: No prevertebral fluid or swelling. No visible canal hematoma. Disc levels: Degenerative changes throughout the cervical spine with narrowed interspaces and endplate hypertrophic changes. Degenerative disc disease at C5-6 and C6-7 levels. Prominent posterior osteophytes at C5-6 and C6-7 cause some impression upon the central canal. C1-2 articulation appears intact. Congenital nonunion of the posterior arch of C1. Degenerative changes throughout the cervical facet joints with posterior coalition at C4-5. Upper chest: There is a fracture of the posterior right first rib but associated with a mildly expansile lucent lesions suggesting a pathologic fracture. Appearance is suspicious for metastasis. Other: None. IMPRESSION: Diffuse degenerative changes throughout the cervical spine. No acute displaced cervical spine fractures identified. There is a pathologic fracture of the right posterior first rib. Electronically Signed   By: Lucienne Capers M.D.   On: 04/02/2016  20:01   Ct Abdomen Pelvis W Contrast  Result Date: 04/02/2016 CLINICAL DATA:  Pt reports to the ED for eval of back pain. Pt had some compression fx diagnosed in April. The pain got worse last night and into today. Pt able to move all extremities. Pt denies any  numbness, tingling, paralysis, or bowel or bladder incontinence EXAM: CT CHEST, ABDOMEN, AND PELVIS WITH CONTRAST TECHNIQUE: Multidetector CT imaging of the chest, abdomen and pelvis was performed following the standard protocol during bolus administration of intravenous contrast. CONTRAST:  100 mL of Isovue-300 intravenous contrast COMPARISON:  Chest CT, 08/22/2014 FINDINGS: CT CHEST FINDINGS Cardiovascular: Heart is normal in size. There are mild coronary artery calcifications. Great vessels normal in caliber. Mild atherosclerotic plaque is noted along the aortic arch and descending thoracic aorta. No significant stenosis. Mediastinum/Nodes: 14 mm nodule lies in the posterior left thyroid lobe, stable. No mediastinal or hilar masses or enlarged lymph nodes. Lungs/Pleura: Small left a minimal right pleural effusions. There is dependent opacity at the lung bases, most notably in the left lower lobe. This is likely atelectasis. Pneumonia is possible. There is no evidence of pulmonary edema. No lung mass or suspicious nodule. No pneumothorax. CT ABDOMEN PELVIS FINDINGS Hepatobiliary: Liver is unremarkable. There is intra and extra hepatic bile duct dilation. Common bile duct measures 12 mm in greatest dimension. No convincing duct stone. Gallbladder surgically absent. Pancreas: No pancreatic mass or inflammation. Mild dilation of the pancreatic duct. Spleen: Normal in size without focal abnormality. Adrenals/Urinary Tract: Mild bilateral renal cortical thinning. No renal masses or stones. No hydronephrosis. Ureters normal in course and in caliber. Bladder is unremarkable. Stomach/Bowel: Stomach and small bowel are unremarkable. Mild increased stool in the colon. No colonic wall thickening or inflammatory change. Appendix not visualized. No evidence of appendicitis. Vascular/Lymphatic: Mild atherosclerotic calcifications noted along a normal caliber abdominal aorta. No enlarged lymph nodes. Reproductive: Status post  hysterectomy. No adnexal masses. Other: No abdominal wall hernia or abnormality. No abdominopelvic ascites. MUSCULOSKELETAL FINDINGS There is a destructive mass arising from the T8 and T9 vertebrae extending to involve the lower aspect of the T7 vertebra and the upper endplate of G40 as well. Mass measures 4.1 x 5.1 x 4.9 cm in size. It extends posteriorly into the spinal canal to cause severe central spinal stenosis. It also extends into the neural foramina, right greater than left causing significant narrowing. There are no other bone lesions. The bones are diffusely demineralized. IMPRESSION: 1. There is a destructive spinal mass centered on the T8 and T9 vertebrae, but also involving the lower aspect of the T7 vertebra and upper aspect of T10. The mass causes severe central stenosis and significant narrowing of the neural foramina. This mass may reflect a primary bone neoplasm such as a plasmacytoma. A metastatic lesion is possible although a primary neoplasm is not visualized. Followup thoracic spine MRI with and without contrast to assess the extent of cord compression is recommended. 2. No other bone lesions. 3. Small left and minimal right pleural effusions. There is associated dependent atelectasis most evident in the left lower lobe. 4. No acute abnormalities below the diaphragm. Electronically Signed   By: Lajean Manes M.D.   On: 04/02/2016 20:10   Ir Fluoro Guide Ndl Plmt / Bx  Result Date: 04/05/2016 INDICATION: Enlarging thoracic mass in the region of T8 and T9 vertebral bodies. EXAM: IR FLUORO GUIDE NEEDLE PLACEMENT /BIOPSY MEDICATIONS: None. ANESTHESIA/SEDATION: Moderate (conscious) sedation was employed during this procedure. A total of Versed  1 mg and Fentanyl 25 mcg was administered intravenously. Moderate Sedation Time: 15 minutes. The patient's level of consciousness and vital signs were monitored continuously by radiology nursing throughout the procedure under my direct supervision.  FLUOROSCOPY TIME:  Fluoroscopy Time: 6 minutes 12 seconds 388 mGy). COMPLICATIONS: None immediate. PROCEDURE: Informed written consent was obtained from the patient after a thorough discussion of the procedural risks, benefits and alternatives. All questions were addressed. Maximal Sterile Barrier Technique was utilized including caps, mask, sterile gowns, sterile gloves, sterile drape, hand hygiene and skin antiseptic. A timeout was performed prior to the initiation of the procedure. The patient was laid prone on the fluoroscopic table. The skin overlying the mid thoracic region was then prepped and draped in the usual sterile fashion. The skin entry site overlying the right pedicle at T9 was then infiltrated with 0.25% bupivacaine and carried to the pedicle itself. Using biplane intermittent fluoroscopy, a 13 gauge Cook spinal needle was then advanced through the destroyed right pedicle and posterior to the mid one-third at T9. Using a 16 gauge core biopsy needle and a 20 mL syringe, samples were obtained with 2 different core biopsy needles for pathologic analysis. Finally aspiration was also performed with a 20 mL syringe through the 13 gauge Cook spinal needle. Specimens obtained were sent together in the same vile for pathologic analysis. The needle was removed and hemostasis was achieved at the skin entry site. The patient tolerated the procedure well. There were no acute complications. At the end of the procedure the patient left her room in a stable condition without change in the upper or lower extremity motor function. IMPRESSION: Status post fluoro guided deep core bone biopsies at T9 for a destructive soft mass. Electronically Signed   By: Luanne Bras M.D.   On: 04/04/2016 22:19   Ct Biopsy  Result Date: 04/11/2016 INDICATION: 82 year old female with a recently diagnosed T8/T9 plasmacytoma. Bone marrow biopsy is warranted to evaluate for marrow involvement. EXAM: CT GUIDED BONE MARROW  ASPIRATION AND CORE BIOPSY Interventional Radiologist:  Criselda Peaches, MD MEDICATIONS: None. ANESTHESIA/SEDATION: Moderate (conscious) sedation was employed during this procedure. A total of 1.5 milligrams versed and 75 micrograms fentanyl were administered intravenously. The patient's level of consciousness and vital signs were monitored continuously by radiology nursing throughout the procedure under my direct supervision. Total monitored sedation time: 10 minutes FLUOROSCOPY TIME:  Fluoroscopy Time: 0 minutes 0 seconds (0 mGy). COMPLICATIONS: None immediate. Estimated blood loss: <25 mL PROCEDURE: Informed written consent was obtained from the patient after a thorough discussion of the procedural risks, benefits and alternatives. All questions were addressed. Maximal Sterile Barrier Technique was utilized including caps, mask, sterile gowns, sterile gloves, sterile drape, hand hygiene and skin antiseptic. A timeout was performed prior to the initiation of the procedure. The patient was positioned prone and non-contrast localization CT was performed of the pelvis to demonstrate the iliac marrow spaces. Maximal barrier sterile technique utilized including caps, mask, sterile gowns, sterile gloves, large sterile drape, hand hygiene, and betadine prep. Under sterile conditions and local anesthesia, an 11 gauge coaxial bone biopsy needle was advanced into the right iliac marrow space. Needle position was confirmed with CT imaging. Initially, bone marrow aspiration was performed. Next, the 11 gauge outer cannula was utilized to obtain a right iliac bone marrow core biopsy. Needle was removed. Hemostasis was obtained with compression. The patient tolerated the procedure well. Samples were prepared with the cytotechnologist. IMPRESSION: Technically successful CT-guided bone marrow biopsy of the  right iliac bone. Signed, Criselda Peaches, MD [facility]  Electronically Signed   By: Jacqulynn Cadet M.D.   On: 04/11/2016 09:53   Dg Bone Survey Met  Result Date: 04/08/2016 CLINICAL DATA:  Recent diagnosis of myeloma. History of a neck fracture according to the patient. EXAM: METASTATIC BONE SURVEY COMPARISON:  None in PACs FINDINGS: Calvarium and spine: No lytic nor blastic lesions are noted within the calvarium or spine. There are compression fractures in the mid to lower thoracic spine with prominent kyphosis. However, fine detail of these fractures is lacking due to osteopenia. Upper extremities: No lytic nor blastic lesions are observed. There are degenerative changes of the first [facility] joint on the right. Ribs and chest: The lungs are hypoinflated. There is increased density at the left lung base. There is thickening of the minor fissure. There is a fracture of the posterior aspect of the left seventh rib. There is calcification in the wall of the aortic arch. The cardiac silhouette is enlarged. The pulmonary vascularity is not engorged. Abdomen and pelvis: The colonic stool burden is moderate. There surgical clips in the gallbladder fossa. The bony pelvis is osteopenic but there is no lytic nor blastic lesion. Hips and lower extremities: The hip joint spaces are reasonably well-maintained. There is a 3 mm diameter lucency in the medial aspect of the left femoral neck. The remainder the lower extremities exhibit no suspicious findings. IMPRESSION: Mid thoracic compression fractures which are poorly evaluated on this study due to osteopenia and overlying densities. Fracture the posterior aspect of the left seventh rib which may be pathologic in nature. 3 mm diameter lucency in the medial aspect of the left femoral neck. Bibasilar atelectasis or pneumonia greatest on the left. Cardiomegaly without pulmonary edema. Aortic atherosclerosis. Electronically Signed   By: David  Martinique M.D.   On: 04/08/2016 09:49   Bone marrow biopsy on 10/16   Subjective: No  new complaints.   Discharge Exam: Vitals:   04/12/16 0543 04/12/16 1410  BP: (!) 157/65 139/62  Pulse: (!) 55 86  Resp: 16 17  Temp: 97.4 F (36.3 C) 97.7 F (36.5 C)   Vitals:   04/11/16 1411 04/11/16 2100 04/12/16 0543 04/12/16 1410  BP: (!) 140/56 125/64 (!) 157/65 139/62  Pulse: 84 62 (!) 55 86  Resp: _0 Temp: 98.1 F (36.7 C) 98 F (36.7 C) 97.4 F (36.3 C) 97.7 F (36.5 C)  TempSrc: Oral Oral Oral Oral  SpO2: 95% 96% 98% 95%  Weight:      Height:        General: Pt is alert, awake, not in acute distress Cardiovascular: RRR, S1/S2 +, no rubs, no gallops Respiratory: CTA bilaterally, no wheezing, no rhonchi Abdominal: Soft, NT, ND, bowel sounds + Extremities: no edema, no cyanosis    The results of significant diagnostics from this hospitalization (including imaging, microbiology, ancillary and laboratory) are listed below for reference.     Microbiology: No results found for this or any previous visit (from the past 240 hour(s)).   Labs: BNP (last 3 results)  Recent Labs  04/02/16 1723  BNP 32.0   Basic Metabolic Panel:  Recent Labs Lab 04/06/16 0338 04/08/16 1014 04/11/16 0408  NA 138 137 138  K 4.5 3.7 4.3  CL 103 104 104  CO2 _1 GLUCOSE 120* 94 119*  BUN 23* 28* 31*  CREATININE 0.88 0.76 0.94  CALCIUM 8.9 9.1 8.9  Liver Function Tests:  Recent Labs Lab 04/06/16 0338  AST 21  ALT 18  ALKPHOS 51  BILITOT 0.5  PROT 5.8*  ALBUMIN 3.1*   No results for input(s): LIPASE, AMYLASE in the last 168 hours. No results for input(s): AMMONIA in the last 168 hours. CBC:  Recent Labs Lab 04/06/16 0338 04/11/16 0408  WBC 7.5 7.6  NEUTROABS  --  6.3  HGB 12.6 12.0  HCT 38.0 36.1  MCV 91.3 90.0  PLT 288 332   Cardiac Enzymes: No results for input(s): CKTOTAL, CKMB, CKMBINDEX, TROPONINI in the last 168 hours. BNP: Invalid input(s): POCBNP CBG: No results for input(s): GLUCAP in the last 168 hours. D-Dimer No  results for input(s): DDIMER in the last 72 hours. Hgb A1c No results for input(s): HGBA1C in the last 72 hours. Lipid Profile No results for input(s): CHOL, HDL, LDLCALC, TRIG, CHOLHDL, LDLDIRECT in the last 72 hours. Thyroid function studies No results for input(s): TSH, T4TOTAL, T3FREE, THYROIDAB in the last 72 hours.  Invalid input(s): FREET3 Anemia work up No results for input(s): VITAMINB12, FOLATE, FERRITIN, TIBC, IRON, RETICCTPCT in the last 72 hours. Urinalysis    Component Value Date/Time   COLORURINE YELLOW 04/02/2016 1617   APPEARANCEUR CLEAR 04/02/2016 1617   LABSPEC 1.010 04/02/2016 1617   PHURINE 6.0 04/02/2016 1617   GLUCOSEU NEGATIVE 04/02/2016 1617   HGBUR TRACE (A) 04/02/2016 1617   HGBUR trace 03/04/2008 1529   BILIRUBINUR NEGATIVE 04/02/2016 1617   KETONESUR 15 (A) 04/02/2016 1617   PROTEINUR NEGATIVE 04/02/2016 1617   UROBILINOGEN 0.2 10/23/2010 0505   NITRITE NEGATIVE 04/02/2016 1617   LEUKOCYTESUR NEGATIVE 04/02/2016 1617   Sepsis Labs Invalid input(s): PROCALCITONIN,  WBC,  LACTICIDVEN Microbiology No results found for this or any previous visit (from the past 240 hour(s)).   Time coordinating discharge: Over 30 minutes  SIGNED:   Hosie Poisson, MD  [facility] 04/12/2016, 4:21 PM Pager   If 7PM-7AM, please contact night-coverage [email or web address] Password [email or web address]

## 2016-04-12 NOTE — Progress Notes (Signed)
Spoke with SW, got the correct SNF, [facility] [phone]. Called report and spoke with [facility], she also has a copy of the discharge summary already. Pt will receive radiation here @3 -3:30 pm then Pt's daughter will bring pt. to [id number] after radiation. Pt has discharge packet.  If needed ED social worker will be available after 4:30pm: Junie Panning [phone]  Thomasene Lot, RN

## 2016-04-12 NOTE — Care Management Important Message (Signed)
Important Message  Patient Details  Name: LYNCOLN HOUSLER MRN: [id number] Date of Birth: 1928/05/19   Medicare Important Message Given:  Yes    Camillo Flaming 04/12/2016, 10:29 AMImportant Message  Patient Details  Name: YASIRAH KURAMOTO MRN: [id number] Date of Birth: 1927-08-11   Medicare Important Message Given:  Yes    Camillo Flaming 04/12/2016, 10:29 AM

## 2016-04-12 NOTE — Progress Notes (Signed)
Pt discharged in stable condition with packet for [facility]. D/C'd via wheelchair to front, where daughter is waiting with car. Daughter is driving pt to [facility].

## 2016-04-12 NOTE — Progress Notes (Signed)
Called [facility] to give pt report and Suanne Marker the admission RN said that they denied her, pt is not expected at [place]. Rhonda's cell # is [phone] Thomasene Lot, RN

## 2016-04-12 NOTE — Clinical Social Work Placement (Signed)
   CLINICAL SOCIAL WORK PLACEMENT  NOTE  Date:  04/12/2016  Patient Details  Name: Mikayla Marks MRN: [id number] Date of Birth: 03-12-1928  Clinical Social Work is seeking post-discharge placement for this patient at the [place] level of care (*CSW will initial, date and re-position this form in  chart as items are completed):  Yes   Patient/family provided with [place] Work Department's list of facilities offering this level of care within the geographic area requested by the patient (or if unable, by the patient's family).  Yes   Patient/family informed of their freedom to choose among providers that offer the needed level of care, that participate in Medicare, Medicaid or managed care program needed by the patient, have an available bed and are willing to accept the patient.      Patient/family informed of [place]'s ownership interest in [facility] and [facility], as well as of the fact that they are under no obligation to receive care at these facilities.  PASRR submitted to EDS on 04/12/16     PASRR number received on 04/12/16     Existing PASRR number confirmed on       FL2 transmitted to all facilities in geographic area requested by pt/family on 04/12/16     FL2 transmitted to all facilities within larger geographic area on       Patient informed that his/her managed care company has contracts with or will negotiate with certain facilities, including the following:        Yes   Patient/family informed of bed offers received.  Patient [facility] bed at [facility]     Physician recommends and patient chooses bed at      Patient to be transferred to [facility] on 04/12/16.  Patient to be transferred to facility by FAMILY     Patient family notified on 04/12/16 of transfer.  Name of family member notified:  Daughter / Brother     PHYSICIAN       Additional Comment: Pt / family  are in agreement with d/c to [facility] today. Pt requesting to go by car. D/C Summary sent to SNF for review. Scripts included in d/c packet. D/C packet provided to pt. # for report provided to nsg.   _______________________________________________ Luretha Rued, [place]   [phone] 04/12/2016, 2:23 PM

## 2016-04-13 ENCOUNTER — Telehealth: Payer: Self-pay | Admitting: Hematology

## 2016-04-13 ENCOUNTER — Encounter: Payer: Self-pay | Admitting: Internal Medicine

## 2016-04-13 ENCOUNTER — Telehealth: Payer: Self-pay | Admitting: Radiation Oncology

## 2016-04-13 ENCOUNTER — Non-Acute Institutional Stay (SKILLED_NURSING_FACILITY): Payer: Medicare Other | Admitting: Internal Medicine

## 2016-04-13 ENCOUNTER — Ambulatory Visit
Admission: RE | Admit: 2016-04-13 | Discharge: 2016-04-13 | Disposition: A | Discharge status: Home / Self Care | Payer: Medicare Other | Source: Ambulatory Visit | Attending: Radiation Oncology | Admitting: Radiation Oncology

## 2016-04-13 ENCOUNTER — Ambulatory Visit
Admit: 2016-04-13 | Discharge: 2016-04-13 | Disposition: A | Discharge status: Home / Self Care | Payer: Medicare Other | Attending: Radiation Oncology | Admitting: Radiation Oncology

## 2016-04-13 DIAGNOSIS — C9030 Solitary plasmacytoma not having achieved remission: Secondary | ICD-10-CM | POA: Diagnosis not present

## 2016-04-13 DIAGNOSIS — R9431 Abnormal electrocardiogram [ECG] [EKG]: Secondary | ICD-10-CM

## 2016-04-13 DIAGNOSIS — F200 Paranoid schizophrenia: Secondary | ICD-10-CM

## 2016-04-13 DIAGNOSIS — G9520 Unspecified cord compression: Secondary | ICD-10-CM | POA: Diagnosis not present

## 2016-04-13 DIAGNOSIS — I10 Essential (primary) hypertension: Secondary | ICD-10-CM | POA: Diagnosis not present

## 2016-04-13 DIAGNOSIS — M8458XS Pathological fracture in neoplastic disease, other specified site, sequela: Secondary | ICD-10-CM | POA: Diagnosis not present

## 2016-04-13 DIAGNOSIS — M8000XS Age-related osteoporosis with current pathological fracture, unspecified site, sequela: Secondary | ICD-10-CM | POA: Diagnosis not present

## 2016-04-13 DIAGNOSIS — Z510 Encounter for antineoplastic radiation therapy: Secondary | ICD-10-CM | POA: Diagnosis not present

## 2016-04-13 DIAGNOSIS — N189 Chronic kidney disease, unspecified: Secondary | ICD-10-CM | POA: Insufficient documentation

## 2016-04-13 DIAGNOSIS — H04123 Dry eye syndrome of bilateral lacrimal glands: Secondary | ICD-10-CM

## 2016-04-13 DIAGNOSIS — I6529 Occlusion and stenosis of unspecified carotid artery: Secondary | ICD-10-CM | POA: Insufficient documentation

## 2016-04-13 LAB — UIFE/LIGHT CHAINS/TP QN, 24-HR UR
% BETA, Urine: 28 %
ALPHA 1 URINE: 3.8 %
Albumin, U: 29 %
Alpha 2, Urine: 18.3 %
FREE KAPPA/LAMBDA RATIO: 21.05 — AB (ref 2.04–10.37)
Free Lambda Lt Chains,Ur: 10.5 mg/L — ABNORMAL HIGH (ref 0.24–6.66)
Free Lt Chn Excr Rate: 221 mg/L — ABNORMAL HIGH (ref 1.35–24.19)
GAMMA GLOBULIN URINE: 21.1 %
PDF-U24IEL: 0
TIME-UPE24: 24 h
Total Protein, Urine-Ur/day: 162 mg/24 hr — ABNORMAL HIGH (ref 30–150)
Total Protein, Urine: 11.2 mg/dL
Total Volume: 1450

## 2016-04-13 NOTE — Progress Notes (Signed)
: Provider:   Noah Delaine. Sheppard Coil, MD Location:  [place] Room Number: [id number] Place of Service:  SNF ([id number])  PCP: Kathlene November, MD Patient Care Team: Colon Branch, MD as PCP - General (Internal Medicine) Myeong Roxine Caddy, DPM as Consulting Physician (Podiatry)  Extended Emergency Contact Information Primary Emergency Contact: Marlou Starks [place] Mobile Phone: [phone] Relation: Daughter Secondary Emergency Contact: Hallmark,Edward Address: [place] [address] Johnnette Litter of [place] Phone: [phone] Relation: Son     Allergies: Other; Bacitracin-polymyxin b; and Penicillins  Chief Complaint  Patient presents with  . New Admit To SNF    Admit to Facility    HPI: Patient is 82 y.o. female with paranoid schizophrenia and HTNwho  was in her usual state of health until around mid-February when she went on a long car ride with her daughter and developed some back pain. By April, this back pain had gotten much worse and she had a new severe kyphosis. The following is a sequence of events that occurred since that time-Mid April 2017:   "Seen at [place], Dr. Mina Marble PM&R, MRI spine ordered which showed "lesions within the T8 and T9 vertebral bodies with associated pathologic fractures and osseous retropulsion...worrisome for metastatic disease, myeloma, or lymphoma". Was referred to Oncology, DID NOT CALL BACK. 10/26/15: Saw PCP in follow up, was recommended to see Oncology, REFUSED 11/11/15: Saw Pulmonology as self-referral for pleural effusions (noted incidentally on MRI of spine that showed metastasis) was recommended to see Oncology, have PET and biopsy via IR, REFUSED 12/08/15: Saw PCP again, SPEP ordered and no M-spike (only "band of restricted mobility" in beta globulins), again referral to Hematology-Oncology and also IR that week June 2017: Saw Orthopedic surgery who recommended biopsy, family  and patient DECLINED" Pt was admitted to [facility] form 10/7-17 for pain relief and initiation of radiation treatments. Bone marrow Bx for MM was finally done on 10/16. Pt is admitted to SNF for generalized weakness where she, unaware of the excellent documentation of events, told me she wasn't diagnosed until now because of her doctor even though  she had had back pain since April. While at SNF pt will be followed for HTN, tx with meds prior, controlled now with none but will monitor, osteoporosis, tx with caltrate and dry eye, tx with restasis.    Past Medical History:  Diagnosis Date  . Carotid artery stenosis   . Chronic kidney disease   . Hyperlipidemia   . HYPERTENSION   . Osteoporosis   . Paranoid schizophrenia ([place])   . SCC (squamous cell carcinoma)    in situ R pretibial area  . Schizoaffective disorder [facility])     Past Surgical History:  Procedure Laterality Date  . BREAST BIOPSY     benign tumor  . CATARACT EXTRACTION    . CHOLECYSTECTOMY    . IR GENERIC HISTORICAL  04/04/2016   IR FLUORO GUIDED NEEDLE PLC ASPIRATION/INJECTION LOC 04/04/2016 Luanne Bras, MD [facility]-INTERV RAD  . LASER ABLATION Left    left LE   . LEG SURGERY Right 10/2012   Ruptured Veins  . PARTIAL HYSTERECTOMY     due to bleeding  . TONSILLECTOMY        Medication List       Accurate as of 04/13/16  9:00 AM. Always use your most recent med list.  acetaminophen-codeine 300-30 MG tablet Commonly known as:  TYLENOL #3 Take 1 tablet by mouth every 8 (eight) hours as needed for severe pain.   CALTRATE 600 PO Take 1 tablet by mouth daily.   cycloSPORINE 0.05 % ophthalmic emulsion Commonly known as:  RESTASIS Place 1 drop into both eyes 2 (two) times daily.   dexamethasone 4 MG tablet Commonly known as:  DECADRON Take 1 tablet (4 mg total) by mouth daily.   docusate sodium 100 MG capsule Commonly known as:  COLACE Take 1 capsule (100 mg total) by mouth 2 (two) times daily.   feeding  supplement (ENSURE ENLIVE) Liqd Take 237 mLs by mouth 2 (two) times daily between meals.   gabapentin 100 MG capsule Commonly known as:  NEURONTIN Take 1 capsule (100 mg total) by mouth 2 (two) times daily.   methocarbamol 500 MG tablet Commonly known as:  ROBAXIN Take 1 tablet (500 mg total) by mouth every 8 (eight) hours as needed for muscle spasms.   multivitamin with minerals tablet Take 1 tablet by mouth 4 (four) times a week.   oxyCODONE 5 MG immediate release tablet Commonly known as:  Oxy IR/ROXICODONE Take 1 tablet (5 mg total) by mouth every 4 (four) hours as needed for moderate pain.   polyethylene glycol packet Commonly known as:  MIRALAX / GLYCOLAX Take 17 g by mouth daily.   SALONPAS EX Apply 1 patch topically as needed (for pain).   senna 8.6 MG Tabs tablet Commonly known as:  SENOKOT Take 1 tablet (8.6 mg total) by mouth 2 (two) times daily.   tiZANidine 4 MG tablet Commonly known as:  ZANAFLEX Take 4-8 mg by mouth daily as needed for muscle spasms.       Meds ordered this encounter  Medications  . acetaminophen-codeine (TYLENOL #3) 300-30 MG tablet    Sig: Take 1 tablet by mouth every 8 (eight) hours as needed for severe pain.    Immunization History  Administered Date(s) Administered  . Influenza Whole 05/01/2007, 03/13/2008, 05/05/2010  . Influenza, High Dose Seasonal PF 08/07/2015  . Influenza,inj,Quad PF,36+ Mos 04/01/2014, 04/03/2016  . PPD Test 04/12/2016  . Pneumococcal Conjugate-13 09/02/2013  . Pneumococcal Polysaccharide-23 09/26/2002, 08/26/2008  . Td 08/02/2007  . Tdap 11/25/2011  . Zoster 08/16/2007    Social History  Substance Use Topics  . Smoking status: Never Smoker  . Smokeless tobacco: Never Used  . Alcohol use No    Family history is   Family History  Problem Relation Age of Onset  . Heart Problems Mother   . Diabetes Mother   . Colon cancer Neg Hx   . Breast cancer Neg Hx   . CAD Neg Hx       Review of  Systems  DATA OBTAINED: from patient, nurse GENERAL:  no fevers, fatigue, appetite changes SKIN: No itching, or rash EYES: No eye pain, redness, discharge EARS: No earache, tinnitus, change in hearing NOSE: No congestion, drainage or bleeding  MOUTH/THROAT: No mouth or tooth pain, No sore throat RESPIRATORY: No cough, wheezing, SOB CARDIAC: No chest pain, palpitations, lower extremity edema  GI: No abdominal pain, No N/V/D or constipation, No heartburn or reflux  GU: No dysuria, frequency or urgency, or incontinence  MUSCULOSKELETAL: No unrelieved bone/joint pain NEUROLOGIC: No headache, dizziness or focal weakness PSYCHIATRIC: No c/o anxiety or sadness   Vitals:   04/13/16 0822  BP: (!) 141/72  Pulse: 68  Resp: 18  Temp: 97.2 F (36.2 C)  SpO2 Readings from Last 1 Encounters:  04/13/16 93%   Body mass index is 27.54 kg/m.     Physical Exam  GENERAL APPEARANCE: Alert, conversant,  No acute distress.  SKIN: c/o wound on her back HEAD: Normocephalic, atraumatic  EYES: Conjunctiva/lids clear. Pupils round, reactive. EOMs intact.  EARS: External exam WNL, canals clear. Hearing grossly normal.  NOSE: No deformity or discharge.  MOUTH/THROAT: Lips w/o lesions  RESPIRATORY: Breathing is even, unlabored. Lung sounds are clear   CARDIOVASCULAR: Heart RRR no murmurs, rubs or gallops. No peripheral edema.   GASTROINTESTINAL: Abdomen is soft, non-tender, not distended w/ normal bowel sounds. GENITOURINARY: Bladder non tender, not distended  MUSCULOSKELETAL: No abnormal joints or musculature NEUROLOGIC:  Cranial nerves 2-12 grossly intact. Moves all extremities  PSYCHIATRIC: pleasant and delusional, no behavioral issues  Patient Active Problem List   Diagnosis Date Noted  . Chronic kidney disease   . Carotid artery stenosis   . Paranoid schizophrenia ([place])   . Spinal cord compression ([place])   . Prolonged QT interval 04/03/2016  . Palliative care encounter   .  Intractable back pain 04/02/2016  . 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 Pathologic vertebral fracture 11/12/2015  . Dyspnea 11/11/2015  . Pleural effusion, bilateral 11/11/2015  . PCP NOTES >>>>>>>>>>>>>>>>>>>>>>>>>>>>>>>> 10/26/2015  . Hammer toe of left foot 01/30/2015  . Bilateral leg edema 09/10/2014  . Onychomycosis 05/19/2014  . Pain in lower limb 05/19/2014  . Fatigue 02/11/2014  . Medicare annual wellness visit, subsequent 09/02/2013  . Encounter for general adult medical examination without abnormal findings 09/02/2013  . Pain in joint, foot, left 07/10/2013  . Corn of toe 02/04/2013  . Varicose veins of lower extremities with inflammation 11/20/2012  . Varicose veins of lower extremities with other complications [id number]  . DEGENERATIVE JOINT DISEASE 01/12/2010  . Schizophrenia ([place]) 06/30/2009  . SKIN CANCER, LEG 05/29/2009  . Cancer of skin of leg 05/29/2009  . OSTEOPOROSIS 08/26/2008  . HYPERLIPIDEMIA 10/16/2006  . Essential hypertension 10/16/2006      Labs reviewed: Basic Metabolic Panel:    Component Value Date/Time   NA 138 04/11/2016 0408   NA 138 04/11/2016   K 4.3 04/11/2016 0408   CL 104 04/11/2016 0408   CO2 26 04/11/2016 0408   GLUCOSE 119 (H) 04/11/2016 0408   GLUCOSE 93 04/20/2006 0832   BUN 31 (H) 04/11/2016 0408   BUN 31 (A) 04/11/2016   CREATININE 0.94 04/11/2016 0408   CALCIUM 8.9 04/11/2016 0408   PROT 5.8 (L) 04/06/2016 0338   ALBUMIN 3.1 (L) 04/06/2016 0338   AST 21 04/06/2016 0338   ALT 18 04/06/2016 0338   ALKPHOS 51 04/06/2016 0338   BILITOT 0.5 04/06/2016 0338   GFRNONAA 53 (L) 04/11/2016 0408   GFRAA >60 04/11/2016 0408     Recent Labs  04/06/16 0338 04/08/16 04/08/16 1014 04/11/16 04/11/16 0408  NA 138 137 137 138 138  K 4.5 3.7 3.7  --  4.3  CL 103  --  104  --  104  CO2 26  --  24  --  26  GLUCOSE 120*  --  94  --  119*  BUN 23* 28* 28* 31* 31*  CREATININE 0.88 0.8 0.76 0.9 0.94  CALCIUM 8.9  --  9.1  --  8.9    Liver Function Tests:  Recent Labs  11/11/15 1623 04/02/16 1723 04/06/16 0338  AST 14 22 21   ALT 14 19 18   ALKPHOS 80 55 51  BILITOT 0.5 0.7  0.5  PROT 7.4 7.0 5.8*  ALBUMIN 4.2 4.0 3.1*    Recent Labs  04/02/16 1723  LIPASE 31   No results for input(s): AMMONIA in the last 8760 hours. CBC:  Recent Labs  11/11/15 1623 04/02/16 1723 04/06/16 04/06/16 0338 04/11/16 04/11/16 0408  WBC 9.3 7.0 7.5 7.5 7.6 7.6  NEUTROABS 7.0 5.5  --   --   --  6.3  HGB 12.4 13.9 12.6 12.6  --  12.0  HCT 36.5 42.1 38 38.0  --  36.1  MCV 93.1 93.1  --  91.3  --  90.0  PLT 390.0 317 288 288  --  332   Lipid  Recent Labs  08/07/15 1127  CHOL 240*  HDL 70.10  LDLCALC 147*  TRIG 116.0    Cardiac Enzymes: No results for input(s): CKTOTAL, CKMB, CKMBINDEX, TROPONINI in the last 8760 hours. BNP:  Recent Labs  04/02/16 1723  BNP 72.5   No results found for: MICROALBUR No results found for: HGBA1C Lab Results  Component Value Date   TSH 1.38 11/11/2015   No results found for: VITAMINB12 No results found for: FOLATE No results found for: IRON, TIBC, FERRITIN  Imaging and Procedures obtained prior to SNF admission: No results found.   Not all labs, radiology exams or other studies done during hospitalization come through on my EPIC note; however they are reviewed by me.    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 - Tto avoid cord compression decadron IV, then po;not op candidate; set up for radiationtx;son who is OB-GYN is [facility]CP[facility] SNF - admitted for OT/PT, supportive care  PARANOID SCHIZOPHRENIA- reported psych was consulted ;onno meds SNF - pleasant but clearly delusional;she has not been paranoid however  HTN-controlled without meds SNF - pt was on multiple meds prior, will monitor for need  OSTEOPOROSIS SNF - RESUME CALTRATE 600 MG PT WAS TAKING PRIOR  DRY EYE SNF - cont restasis drops  PROLONGED QT SNF - avoid offending  meds   Time spent > 45 min;> 50% of time with patient was spent reviewing records, labs, tests and studies, counseling and developing plan of care  Webb Silversmith D. Sheppard Coil , MD

## 2016-04-13 NOTE — Telephone Encounter (Signed)
Faxed copy of patient's treatment schedule to [facility]. Confirmation fax of delivery obtained.

## 2016-04-13 NOTE — Addendum Note (Signed)
Encounter addended by: Heywood Footman, RN on: 04/13/2016 10:32 AM<BR>    Actions taken: Chief Complaint modified, Patient Education assessment filed

## 2016-04-13 NOTE — Telephone Encounter (Signed)
Lt mess regarding scheduling appt. For hospital f/u.  Daughter pick up phone while leaving mess and tried to inquire about the mothers care. I informed her I was unable to discuss her mothers care and was only trying to schedule appt at this time.

## 2016-04-14 ENCOUNTER — Ambulatory Visit
Admission: RE | Admit: 2016-04-14 | Discharge: 2016-04-14 | Disposition: A | Discharge status: Home / Self Care | Payer: Medicare Other | Source: Ambulatory Visit | Attending: Radiation Oncology | Admitting: Radiation Oncology

## 2016-04-14 ENCOUNTER — Ambulatory Visit: Payer: Medicare Other

## 2016-04-14 ENCOUNTER — Inpatient Hospital Stay: Admission: RE | Admit: 2016-04-14 | Payer: Medicare Other | Source: Ambulatory Visit | Admitting: Radiation Oncology

## 2016-04-14 DIAGNOSIS — Z510 Encounter for antineoplastic radiation therapy: Secondary | ICD-10-CM | POA: Diagnosis not present

## 2016-04-14 DIAGNOSIS — C9030 Solitary plasmacytoma not having achieved remission: Secondary | ICD-10-CM | POA: Diagnosis not present

## 2016-04-14 DIAGNOSIS — G9520 Unspecified cord compression: Secondary | ICD-10-CM | POA: Diagnosis not present

## 2016-04-15 ENCOUNTER — Ambulatory Visit: Payer: Medicare Other

## 2016-04-15 ENCOUNTER — Ambulatory Visit
Admission: RE | Admit: 2016-04-15 | Discharge: 2016-04-15 | Disposition: A | Discharge status: Home / Self Care | Payer: Medicare Other | Source: Ambulatory Visit | Attending: Radiation Oncology | Admitting: Radiation Oncology

## 2016-04-15 ENCOUNTER — Telehealth: Payer: Self-pay | Admitting: Hematology

## 2016-04-15 DIAGNOSIS — Z510 Encounter for antineoplastic radiation therapy: Secondary | ICD-10-CM | POA: Diagnosis not present

## 2016-04-15 DIAGNOSIS — G9520 Unspecified cord compression: Secondary | ICD-10-CM | POA: Diagnosis not present

## 2016-04-15 DIAGNOSIS — C9030 Solitary plasmacytoma not having achieved remission: Secondary | ICD-10-CM | POA: Diagnosis not present

## 2016-04-15 NOTE — Telephone Encounter (Signed)
Spoke with skilled nursing home ([facility]) regarding patient if an appointment could be added today, per Dr. Burr Medico note for 9 am appt.  [place] was unable to coordinate for this appt. I was given confirmed times for Rad onc appt for Mon and Tues.  In basket Dr Burr Medico the info and awaiting her response at this time.  Pt's daughter contact the office to schedule mother's appointment with Dr. Burr Medico I reviewed the necessary procedures (HIPPA guideline) to proceed with securing an appointment.

## 2016-04-17 DIAGNOSIS — H04123 Dry eye syndrome of bilateral lacrimal glands: Secondary | ICD-10-CM | POA: Insufficient documentation

## 2016-04-18 ENCOUNTER — Ambulatory Visit
Admission: RE | Admit: 2016-04-18 | Discharge: 2016-04-18 | Disposition: A | Discharge status: Home / Self Care | Payer: Medicare Other | Source: Ambulatory Visit | Attending: Radiation Oncology | Admitting: Radiation Oncology

## 2016-04-18 ENCOUNTER — Ambulatory Visit: Payer: Medicare Other

## 2016-04-18 ENCOUNTER — Encounter: Payer: Self-pay | Admitting: Radiation Oncology

## 2016-04-18 ENCOUNTER — Telehealth: Payer: Self-pay | Admitting: Hematology

## 2016-04-18 VITALS — BP 146/93 | HR 95 | Temp 98.2°F

## 2016-04-18 DIAGNOSIS — G9520 Unspecified cord compression: Secondary | ICD-10-CM | POA: Diagnosis not present

## 2016-04-18 DIAGNOSIS — C9030 Solitary plasmacytoma not having achieved remission: Secondary | ICD-10-CM

## 2016-04-18 DIAGNOSIS — Z510 Encounter for antineoplastic radiation therapy: Secondary | ICD-10-CM | POA: Diagnosis not present

## 2016-04-18 LAB — BASIC METABOLIC PANEL
BUN: 24 mg/dL — AB (ref 4–21)
Creatinine: 0.9 mg/dL (ref 0.5–1.1)
GLUCOSE: 86 mg/dL
Potassium: 4 mmol/L (ref 3.4–5.3)
SODIUM: 139 mmol/L (ref 137–147)

## 2016-04-18 LAB — CBC AND DIFFERENTIAL
HEMATOCRIT: 37 % (ref 36–46)
HEMOGLOBIN: 12.4 g/dL (ref 12.0–16.0)
Platelets: 381 10*3/uL (ref 150–399)
WBC: 8.5 10*3/mL

## 2016-04-18 NOTE — Telephone Encounter (Signed)
[place] to schedule hosptial f/u for 04/20/16

## 2016-04-18 NOTE — Progress Notes (Signed)
  Radiation Oncology         [phone]   Name: Mikayla Marks MRN: [id number]   Date: 04/18/2016  DOB: 1928/05/02   Weekly Radiation Therapy Management    ICD-9-CM ICD-10-CM   1. 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  Weekly rad txs T-spine 9/10 completed. Patient reports pain is 7/10 in severity. Patient denies nausea and reports her appetite is good. Patient reports fatigue. Patient reports bowels are regular now since she has stopped Miralax. She is currently residing at [facility]. Her brother is with her today in the clinic. Per nursing, patient is unable to stand to be weighed, she is too weak.  The patient is without complaint. Set-up films were reviewed. The chart was checked.  Physical Findings  oral temperature is 98.2 F (36.8 C). Her blood pressure is 146/93 (abnormal) and her pulse is 95. . Weight essentially stable.  No significant changes. Patient presents in a wheelchair today.  Impression The patient is tolerating radiation.   Plan Continue treatment as planned. Will contact her SNF regarding pain management.      Sheral Apley Tammi Klippel, M.D.  This document serves as a record of services personally performed by Tyler Pita, MD. It was created on his behalf by Maryla Morrow, a trained medical scribe. The creation of this record is based on the scribe's personal observations and the provider's statements to them. This document has been checked and approved by the attending provider.

## 2016-04-18 NOTE — Progress Notes (Addendum)
Weekly rad txs  T-spine 9/10 completed, pain 7/10 scale,  No nausea, appetite good, , fatiged,  Bowels regular now, stopped miralax, residing at [facility], brother with patient,  In w/c, unable to stand to get weighed, too weak, 1 month follow up appt with Shona Simpson 06/02/16 BP (!) 146/93 (BP Location: Right Arm, Patient Position: Sitting, Cuff Size: Normal)   Pulse 95   Temp 98.2 F (36.8 C) (Oral)   Wt Readings from Last 3 Encounters:  04/13/16 141 lb (64 kg)  04/02/16 141 lb (64 kg)  12/08/15 162 lb (73.5 kg)   3:06 PM

## 2016-04-19 ENCOUNTER — Telehealth: Payer: Self-pay | Admitting: Radiation Oncology

## 2016-04-19 ENCOUNTER — Ambulatory Visit: Payer: Medicare Other

## 2016-04-19 ENCOUNTER — Ambulatory Visit
Admission: RE | Admit: 2016-04-19 | Discharge: 2016-04-19 | Disposition: A | Discharge status: Home / Self Care | Payer: Medicare Other | Source: Ambulatory Visit | Attending: Radiation Oncology | Admitting: Radiation Oncology

## 2016-04-19 DIAGNOSIS — L89100 Pressure ulcer of unspecified part of back, unstageable: Secondary | ICD-10-CM | POA: Diagnosis not present

## 2016-04-19 DIAGNOSIS — G9520 Unspecified cord compression: Secondary | ICD-10-CM | POA: Diagnosis not present

## 2016-04-19 DIAGNOSIS — Z510 Encounter for antineoplastic radiation therapy: Secondary | ICD-10-CM | POA: Diagnosis not present

## 2016-04-19 DIAGNOSIS — C9030 Solitary plasmacytoma not having achieved remission: Secondary | ICD-10-CM | POA: Diagnosis not present

## 2016-04-19 NOTE — Telephone Encounter (Signed)
Phoned Monet, RN caring for patient while at [facility]. Explained patient's radiation treatment plan has been extended ten additional fractions. Faxed copy of new treatment schedule to [phone]. Confirmation fax of delivery obtained. In addition, reported to [facility] the patient complained her pain isn't being well controlled. Monet, RN reports the patient has oxycodone 5 mg q6h prn but, "everytime we ask about her pain she says she has none." Monet, RN goes onto explain the patient insist her pain medication remain prn and not scheduled. Monet, RN commits to speaking with Dr. Serita Sheller (attending at [facility] about these new findings.

## 2016-04-20 ENCOUNTER — Ambulatory Visit
Admission: RE | Admit: 2016-04-20 | Discharge: 2016-04-20 | Disposition: A | Discharge status: Home / Self Care | Payer: Medicare Other | Source: Ambulatory Visit | Attending: Radiation Oncology | Admitting: Radiation Oncology

## 2016-04-20 ENCOUNTER — Ambulatory Visit (HOSPITAL_BASED_OUTPATIENT_CLINIC_OR_DEPARTMENT_OTHER): Payer: Medicare Other | Admitting: Nurse Practitioner

## 2016-04-20 VITALS — BP 157/70 | HR 51 | Temp 97.6°F | Resp 18 | Ht 60.0 in | Wt 145.1 lb

## 2016-04-20 DIAGNOSIS — G893 Neoplasm related pain (acute) (chronic): Secondary | ICD-10-CM

## 2016-04-20 DIAGNOSIS — G9520 Unspecified cord compression: Secondary | ICD-10-CM | POA: Diagnosis not present

## 2016-04-20 DIAGNOSIS — C9030 Solitary plasmacytoma not having achieved remission: Secondary | ICD-10-CM

## 2016-04-20 DIAGNOSIS — M549 Dorsalgia, unspecified: Secondary | ICD-10-CM | POA: Diagnosis not present

## 2016-04-20 DIAGNOSIS — Z510 Encounter for antineoplastic radiation therapy: Secondary | ICD-10-CM | POA: Diagnosis not present

## 2016-04-20 NOTE — Progress Notes (Signed)
[facility] OFFICE PROGRESS NOTE   Diagnosis:  Plasmacytoma involving T8 and T9  INTERVAL HISTORY:   Mikayla Marks is seen in her first outpatient follow-up at the [facility] since hospital discharge. She was hospitalized 04/02/2016 with worsening back pain and was seen by Dr Burr Medico. To review, she was referred to orthopedics in April 2017 for evaluation of back pain. MRI of the thoracic and lumbar spine 10/15/2015 showed expansile lesions within the T8 and T9 vertebral bodies with associated pathologic fractures and osseous retropulsion. There were additional smaller lesions in the T11 pedicles bilaterally. She was referred to the [facility] but did not keep her appointment. CT scans on 04/02/2016 showed a destructive spinal mass centered on the T8 and T9 vertebrae but also involving the lower aspect of the T7 vertebrae and upper aspect of T10. The mass causes severe central stenosis and significant narrowing of the neural foramina. CT biopsy of T9 on 04/04/2016 showed plasma cell neoplasm. She has started radiation therapy. 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 04/07/2016 beta-2 microglobulin normal at 2.0; LDH normal at 146. Bone survey 04/08/2016 showed midthoracic compression fractures which are poorly evaluated due to osteopenia and overlying densities; fracture posterior aspect of the left seventh rib which may be pathologic; 3 mm diameter lucency medial aspect of the left femoral neck. She had a normal CBC on 04/11/2016; normal creatinine 04/11/2016. Bone marrow biopsy 04/11/2016 showed a hypercellular bone marrow with plasma cell neoplasm. The plasma cells represent 13% of all cells with lack of large aggregates or sheets.  She reports back pain is better. She achieves good relief with oxycodone, typically  taking twice a day. She has a good appetite. She is participating in physical therapy. Bowels are moving. No nausea or vomiting. She notes leg swelling with prolonged standing. The leg swelling resolves over night.  Objective:  Vital signs in last 24 hours:  Blood pressure (!) 157/70, pulse (!) 51, temperature 97.6 F (36.4 C), temperature source Oral, resp. rate 18, height 5' (1.524 m), weight 145 lb 1.6 oz (65.8 kg), SpO2 97 %.    HEENT: No thrush or ulcers. Resp: Lungs clear bilaterally. Cardio: Regular rate and rhythm. GI: Abdomen soft and nontender. Vascular: Trace pitting edema at the lower legs bilaterally. Chronic stasis changes. Musculoskeletal: Back is kyphotic.      Lab Results:  Lab Results  Component Value Date   WBC 7.6 04/11/2016   HGB 12.0 04/11/2016   HCT 36.1 04/11/2016   MCV 90.0 04/11/2016   PLT 332 04/11/2016   NEUTROABS 6.3 04/11/2016    Imaging:  No results found.  Medications: I have reviewed the patient's current medications.  Assessment/Plan: 1. Plasmacytoma T8 and T9 currently completing a course of radiation therapy 2. Back pain secondary to #1. She continues oxycodone as needed. 3. Bone marrow biopsy 04/11/2016 with 13% plasma cells 4. Schizophrenia 5. Hypertension   Disposition: Mikayla Marks appears stable. Her back pain is better. She will continue oxycodone as needed. She is scheduled to complete the course of radiation on 05/03/2016.   She was accompanied to today's visit by her brother. I reviewed the bone marrow biopsy results with them. We discussed the diagnosis of multiple myeloma based on the 13% plasma cells on the bone marrow specimen.   Dr. Burr Medico would like for her to complete the course of  radiation therapy. She will return for a follow-up visit on the final day of radiation to discuss the multiple myeloma diagnosis further. Mikayla Marks and her brother are in agreement with this plan. She will contact the office in the  interim with any problems.   Plan reviewed with Dr. Burr Medico. 25 minutes were spent face-to-face at today's visit with the majority of that time involved in counseling/coordination of care.   Ned Card ANP/GNP-BC   04/20/2016  2:31 PM

## 2016-04-21 ENCOUNTER — Telehealth: Payer: Self-pay | Admitting: Hematology

## 2016-04-21 ENCOUNTER — Ambulatory Visit
Admission: RE | Admit: 2016-04-21 | Discharge: 2016-04-21 | Disposition: A | Discharge status: Home / Self Care | Payer: Medicare Other | Source: Ambulatory Visit | Attending: Radiation Oncology | Admitting: Radiation Oncology

## 2016-04-21 DIAGNOSIS — G9520 Unspecified cord compression: Secondary | ICD-10-CM | POA: Diagnosis not present

## 2016-04-21 DIAGNOSIS — C9030 Solitary plasmacytoma not having achieved remission: Secondary | ICD-10-CM | POA: Diagnosis not present

## 2016-04-21 DIAGNOSIS — Z510 Encounter for antineoplastic radiation therapy: Secondary | ICD-10-CM | POA: Diagnosis not present

## 2016-04-21 LAB — CHROMOSOME ANALYSIS, BONE MARROW

## 2016-04-21 NOTE — Telephone Encounter (Signed)
I called pt's son Dr. Deedra Ehrich and discussed her bone marrow biopsy result and lab results. I recommend her to complete the full dose of radiation for her plasmacytoma, but will hold any systemic therapy at this point. He voiced good understanding, and again stated that his mother's quality of life is his main concern. He appreciated a call. Questions were answered, I'll see patient back on her last day of radiation.   Truitt Merle  04/21/2016

## 2016-04-22 ENCOUNTER — Ambulatory Visit
Admission: RE | Admit: 2016-04-22 | Discharge: 2016-04-22 | Disposition: A | Discharge status: Home / Self Care | Payer: Medicare Other | Source: Ambulatory Visit | Attending: Radiation Oncology | Admitting: Radiation Oncology

## 2016-04-22 VITALS — BP 148/70 | HR 94 | Resp 18

## 2016-04-22 DIAGNOSIS — G9520 Unspecified cord compression: Secondary | ICD-10-CM | POA: Diagnosis not present

## 2016-04-22 DIAGNOSIS — Z510 Encounter for antineoplastic radiation therapy: Secondary | ICD-10-CM | POA: Diagnosis not present

## 2016-04-22 DIAGNOSIS — C9030 Solitary plasmacytoma not having achieved remission: Secondary | ICD-10-CM

## 2016-04-22 NOTE — Progress Notes (Signed)
  Radiation Oncology         [phone]   Name: Mikayla Marks MRN: [id number]   Date: 04/22/2016  DOB: 09-04-1927   Weekly Radiation Therapy Management    ICD-9-CM ICD-10-CM   1. Plasmacytoma ([place]) 238.6 C90.30     Current Dose: 26 Gy  Planned Dose:  40 Gy  Narrative The patient presents for routine under treatment assessment.  Vitals stable. Patient reports back pain is better, and she has relief with Oxycodone. She confirms the facility is doing better about inquiring about her pain prior to transportation for radiation. She reports last night she experienced back pain related to muscle spasm in her back, but was able to do exercises to achieve relief. She describes her back pain as "nervousness in the muscles." The patient reports working with PT for 2 hours a day. She denies nausea or vomiting. Denies constipation. Per nursing, no edema of the extremities is noted. The patient confirms her legs are still weak, but are getting stronger.  The patient is without complaint. Set-up films were reviewed. The chart was checked.  Physical Findings  blood pressure is 148/70 (abnormal) and her pulse is 94. Her respiration is 18 and oxygen saturation is 100%. . Weight essentially stable.  No significant changes. Patient presents in a wheelchair today.  Impression The patient is tolerating radiation.   Plan Continue treatment as planned.      Sheral Apley Tammi Klippel, M.D.  This document serves as a record of services personally performed by Tyler Pita, MD. It was created on his behalf by Maryla Morrow, a trained medical scribe. The creation of this record is based on the scribe's personal observations and the provider's statements to them. This document has been checked and approved by the attending provider.

## 2016-04-22 NOTE — Progress Notes (Signed)
Vitals stable. Reports back pain is better. Reports relief with oxycodone. She confirms the facility is doing better about inquiring about her pain prior to transportation for radiation. Reports last night she experienced back pain related to muscle spasm in her back but, was able to do arm exercises to achieve relief. Earlier in the week I received a written list with a series of questions the patient wanted answer. We reviewed those questions today and patient was satisfied by the answers. Patient confirms she doesn't worry about tomorrow, is thank for today and ready when the [facility] calls her home. She is working with PT two hours per day. Denies nausea or vomiting. Denies constipation. No edema of lower extremities noted. Confirms her legs are still weak but, getting stronger.   BP (!) 148/70 (BP Location: Right Arm, Patient Position: Sitting, Cuff Size: Normal)   Pulse 94   Resp 18   SpO2 100%  Wt Readings from Last 3 Encounters:  04/20/16 145 lb 1.6 oz (65.8 kg)  04/13/16 141 lb (64 kg)  04/02/16 141 lb (64 kg)

## 2016-04-25 ENCOUNTER — Telehealth: Payer: Self-pay | Admitting: Hematology

## 2016-04-25 ENCOUNTER — Ambulatory Visit
Admission: RE | Admit: 2016-04-25 | Discharge: 2016-04-25 | Disposition: A | Discharge status: Home / Self Care | Payer: Medicare Other | Source: Ambulatory Visit | Attending: Radiation Oncology | Admitting: Radiation Oncology

## 2016-04-25 DIAGNOSIS — Z510 Encounter for antineoplastic radiation therapy: Secondary | ICD-10-CM | POA: Diagnosis not present

## 2016-04-25 DIAGNOSIS — C9030 Solitary plasmacytoma not having achieved remission: Secondary | ICD-10-CM | POA: Diagnosis not present

## 2016-04-25 DIAGNOSIS — G9520 Unspecified cord compression: Secondary | ICD-10-CM | POA: Diagnosis not present

## 2016-04-25 NOTE — Telephone Encounter (Signed)
LEFT MESSAGE RE 11/7 F/U AND ADDED COMMENT TO XRT APPOINTMENT NOTE TO SEND PATIENT FOR NEW SCHEDULE.

## 2016-04-26 ENCOUNTER — Ambulatory Visit
Admission: RE | Admit: 2016-04-26 | Discharge: 2016-04-26 | Disposition: A | Discharge status: Home / Self Care | Payer: Medicare Other | Source: Ambulatory Visit | Attending: Radiation Oncology | Admitting: Radiation Oncology

## 2016-04-26 DIAGNOSIS — L89100 Pressure ulcer of unspecified part of back, unstageable: Secondary | ICD-10-CM | POA: Diagnosis not present

## 2016-04-26 DIAGNOSIS — C9030 Solitary plasmacytoma not having achieved remission: Secondary | ICD-10-CM | POA: Diagnosis not present

## 2016-04-26 DIAGNOSIS — G9520 Unspecified cord compression: Secondary | ICD-10-CM | POA: Diagnosis not present

## 2016-04-26 DIAGNOSIS — Z510 Encounter for antineoplastic radiation therapy: Secondary | ICD-10-CM | POA: Diagnosis not present

## 2016-04-27 ENCOUNTER — Ambulatory Visit
Admission: RE | Admit: 2016-04-27 | Discharge: 2016-04-27 | Disposition: A | Discharge status: Home / Self Care | Payer: Medicare Other | Source: Ambulatory Visit | Attending: Radiation Oncology | Admitting: Radiation Oncology

## 2016-04-27 ENCOUNTER — Inpatient Hospital Stay: Payer: Medicare Other | Admitting: Hematology

## 2016-04-27 DIAGNOSIS — G9520 Unspecified cord compression: Secondary | ICD-10-CM | POA: Diagnosis not present

## 2016-04-27 DIAGNOSIS — C9030 Solitary plasmacytoma not having achieved remission: Secondary | ICD-10-CM | POA: Diagnosis not present

## 2016-04-27 DIAGNOSIS — Z510 Encounter for antineoplastic radiation therapy: Secondary | ICD-10-CM | POA: Diagnosis not present

## 2016-04-28 ENCOUNTER — Ambulatory Visit
Admission: RE | Admit: 2016-04-28 | Discharge: 2016-04-28 | Disposition: A | Discharge status: Home / Self Care | Payer: Medicare Other | Source: Ambulatory Visit | Attending: Radiation Oncology | Admitting: Radiation Oncology

## 2016-04-28 ENCOUNTER — Encounter (HOSPITAL_COMMUNITY): Payer: Self-pay

## 2016-04-28 ENCOUNTER — Encounter: Payer: Self-pay | Admitting: Radiation Oncology

## 2016-04-28 VITALS — BP 149/68 | HR 96 | Resp 16 | Wt 146.0 lb

## 2016-04-28 DIAGNOSIS — C9030 Solitary plasmacytoma not having achieved remission: Secondary | ICD-10-CM | POA: Diagnosis not present

## 2016-04-28 DIAGNOSIS — Z510 Encounter for antineoplastic radiation therapy: Secondary | ICD-10-CM | POA: Diagnosis not present

## 2016-04-28 DIAGNOSIS — G9520 Unspecified cord compression: Secondary | ICD-10-CM | POA: Diagnosis not present

## 2016-04-28 NOTE — Progress Notes (Signed)
Weight and vitals stable. Reports middle back pain that radiates around to her left ribs when she lifts her feet to get in the bed. Otherwise she reports she is pain free. Reports some discomfort with swallowing but, manageable. Reports she is ambulating with PT regularly using a walker. Patient is very pleased with her progress. Pea size are of breakdown noted center of back cover with large bandaid.   BP (!) 149/68 (BP Location: Left Arm, Patient Position: Sitting, Cuff Size: Normal)   Pulse 96   Resp 16   Wt 146 lb (66.2 kg)   SpO2 100%   BMI 28.51 kg/m  Wt Readings from Last 3 Encounters:  04/28/16 146 lb (66.2 kg)  04/20/16 145 lb 1.6 oz (65.8 kg)  04/13/16 141 lb (64 kg)

## 2016-04-28 NOTE — Progress Notes (Signed)
  Radiation Oncology         [phone]   Name: Mikayla Marks MRN: [id number]   Date: 04/28/2016  DOB: 1927-10-21   Weekly Radiation Therapy Management    ICD-9-CM ICD-10-CM   1. Plasmacytoma ([place]) 238.6 C90.30     Current Dose: 34 Gy  Planned Dose:  40 Gy  Narrative The patient presents for routine under treatment assessment.  Weight and vitals stable. She reports middle back pain that radiates around to her left ribs when she lifts her feet to get in bed. Otherwise, she reports she is pain free. The patient reports some discomfort with swallowing, but it is manageable. She reports she is ambulating with PT regularly using a walker. The patient reports being very pleased with her progress. Per nursing, pea-sized area of breakdown noted in the center of the patient's back, covered with a large bandaid. The patient questions the next steps to be taken in regards to her myeloma.  The patient is without complaint. Set-up films were reviewed. The chart was checked.  Physical Findings  weight is 146 lb (66.2 kg). Her blood pressure is 149/68 (abnormal) and her pulse is 96. Her respiration is 16 and oxygen saturation is 100%. . Weight essentially stable.  No significant changes. Patient presents in a wheelchair.  Impression The patient is tolerating radiation.   Plan Continue treatment as planned.       Sheral Apley Tammi Klippel, M.D.  This document serves as a record of services personally performed by Tyler Pita, MD. It was created on his behalf by Maryla Morrow, a trained medical scribe. The creation of this record is based on the scribe's personal observations and the provider's statements to them. This document has been checked and approved by the attending provider.

## 2016-04-29 ENCOUNTER — Ambulatory Visit
Admission: RE | Admit: 2016-04-29 | Discharge: 2016-04-29 | Disposition: A | Discharge status: Home / Self Care | Payer: Medicare Other | Source: Ambulatory Visit | Attending: Radiation Oncology | Admitting: Radiation Oncology

## 2016-04-29 DIAGNOSIS — G9520 Unspecified cord compression: Secondary | ICD-10-CM | POA: Diagnosis not present

## 2016-04-29 DIAGNOSIS — Z510 Encounter for antineoplastic radiation therapy: Secondary | ICD-10-CM | POA: Diagnosis not present

## 2016-04-29 DIAGNOSIS — C9030 Solitary plasmacytoma not having achieved remission: Secondary | ICD-10-CM | POA: Diagnosis not present

## 2016-05-02 ENCOUNTER — Encounter: Payer: Self-pay | Admitting: Internal Medicine

## 2016-05-02 ENCOUNTER — Non-Acute Institutional Stay (SKILLED_NURSING_FACILITY): Payer: Medicare Other | Admitting: Internal Medicine

## 2016-05-02 ENCOUNTER — Ambulatory Visit: Admission: RE | Admit: 2016-05-02 | Payer: Medicare Other | Source: Ambulatory Visit

## 2016-05-02 DIAGNOSIS — C9030 Solitary plasmacytoma not having achieved remission: Secondary | ICD-10-CM

## 2016-05-02 DIAGNOSIS — H04123 Dry eye syndrome of bilateral lacrimal glands: Secondary | ICD-10-CM | POA: Diagnosis not present

## 2016-05-02 DIAGNOSIS — M8458XS Pathological fracture in neoplastic disease, other specified site, sequela: Secondary | ICD-10-CM

## 2016-05-02 DIAGNOSIS — I10 Essential (primary) hypertension: Secondary | ICD-10-CM

## 2016-05-02 DIAGNOSIS — R9431 Abnormal electrocardiogram [ECG] [EKG]: Secondary | ICD-10-CM

## 2016-05-02 DIAGNOSIS — M8000XS Age-related osteoporosis with current pathological fracture, unspecified site, sequela: Secondary | ICD-10-CM

## 2016-05-02 DIAGNOSIS — F200 Paranoid schizophrenia: Secondary | ICD-10-CM | POA: Diagnosis not present

## 2016-05-02 NOTE — Progress Notes (Signed)
Location:  [facility] [place] Room Number: 106P Place of Service:  SNF (31)  Mikayla Delaine. Sheppard Coil, MD  PCP: Kathlene November, MD Patient Care Team: Colon Branch, MD as PCP - General (Internal Medicine) Myeong Roxine Caddy, DPM as Consulting Physician (Podiatry)  Extended Emergency Contact Information Primary Emergency Contact: Marlou Starks [place] Mobile Phone: [phone] Relation: Daughter Secondary Emergency Contact: Guidotti,Edward Address: [place] [address] Johnnette Litter of [place] Phone: [phone] Relation: Son  Allergies  Allergen Reactions  . Other Other (See Comments)    5FU cream - stroke like symptoms  . Bacitracin-Polymyxin B Other (See Comments)    Reaction unknown.   Marland Kitchen Penicillins Rash    Chief Complaint  Patient presents with  . Discharge Note    Discharged from SNF    HPI:  82 y.o. female with paranoid schizophrenia and HTNwho  was in her usual state of health until around mid-February when she went on a long car ride with her daughter and developed some back pain. By April, this back pain had gotten much worse and she had a new severe kyphosis. The following is a sequence of events that occurred since that time-Mid April 2017:   "Seen at [facility], Dr. Mina Marble PM&R, MRI spine ordered which showed "lesions within the T8 and T9 vertebral bodies with associated pathologic fractures and osseous retropulsion...worrisome for metastatic disease, myeloma, or lymphoma". Was referred to Oncology, DID NOT CALL BACK. 10/26/15: Saw PCP in follow up, was recommended to see Oncology, REFUSED 11/11/15: Saw Pulmonology as self-referral for pleural effusions (noted incidentally on MRI of spine that showed metastasis) was recommended to see Oncology, have PET and biopsy via IR, REFUSED 12/08/15: Saw PCP again, SPEP ordered and no M-spike (only "band of restricted mobility" in beta globulins), again referral  to Hematology-Oncology and also IR that week June 2017: Saw Orthopedic surgery who recommended biopsy, family and patient DECLINED" Pt was admitted to [facility] form 10/7-17 for pain relief and initiation of radiation treatments. Bone marrow Bx for MM was finally done on 10/16. Pt is admitted to SNF for generalized weakness where she, unaware of the excellent documentation of events, told me she wasn't diagnosed until now because of her doctor even though  she had had back pain since April. Pt has been getting radiation treatments while she has been at [facility]. Pt is now ready to be d/c to home.    Past Medical History:  Diagnosis Date  . Carotid artery stenosis   . Chronic kidney disease   . Hyperlipidemia   . HYPERTENSION   . Osteoporosis   . Paranoid schizophrenia ([place])   . SCC (squamous cell carcinoma)    in situ R pretibial area  . Schizoaffective disorder [facility])     Past Surgical History:  Procedure Laterality Date  . BREAST BIOPSY     benign tumor  . CATARACT EXTRACTION    . CHOLECYSTECTOMY    . IR GENERIC HISTORICAL  04/04/2016   IR FLUORO GUIDED NEEDLE PLC ASPIRATION/INJECTION LOC 04/04/2016 Luanne Bras, MD [facility]-INTERV RAD  . LASER ABLATION Left    left LE   . LEG SURGERY Right 10/2012   Ruptured Veins  . PARTIAL HYSTERECTOMY     due to bleeding  . TONSILLECTOMY       reports that she has never smoked. She has never used smokeless tobacco. She reports that she  does not drink alcohol or use drugs. Social History   Social History  . Marital status: Single    Spouse name: N/A  . Number of children: 3  . Years of education: N/A   Occupational History  . not working    Social History Main Topics  . Smoking status: Never Smoker  . Smokeless tobacco: Never Used  . Alcohol use No  . Drug use: No  . Sexual activity: No   Other Topics Concern  . Not on file   Social History Narrative   Daughter lives with her [name])     still drives   widow   Brother [name]  in [place], lives 5 min away [phone]          Pertinent  Health Maintenance Due  Topic Date Due  . INFLUENZA VACCINE  Completed  . DEXA SCAN  Completed  . PNA vac Low Risk Adult  Completed    Medications:   Medication List       Accurate as of 05/02/16  4:02 PM. Always use your most recent med list.          CALTRATE 600 PO Take 1 tablet by mouth daily.   cycloSPORINE 0.05 % ophthalmic emulsion Commonly known as:  RESTASIS Place 1 drop into both eyes 2 (two) times daily.   dexamethasone 4 MG tablet Commonly known as:  DECADRON Take 1 tablet (4 mg total) by mouth daily.   docusate sodium 100 MG capsule Commonly known as:  COLACE Take 1 capsule (100 mg total) by mouth 2 (two) times daily.   feeding supplement (ENSURE ENLIVE) Liqd Take 237 mLs by mouth 2 (two) times daily between meals.   gabapentin 100 MG capsule Commonly known as:  NEURONTIN Take 1 capsule (100 mg total) by mouth 2 (two) times daily.   methocarbamol 500 MG tablet Commonly known as:  ROBAXIN Take 1 tablet (500 mg total) by mouth every 8 (eight) hours as needed for muscle spasms.   multivitamin with minerals tablet Take 1 tablet by mouth 4 (four) times a week.   oxyCODONE 5 MG immediate release tablet Commonly known as:  Oxy IR/ROXICODONE Take 1 tablet (5 mg total) by mouth every 4 (four) hours as needed for moderate pain.   polyethylene glycol packet Commonly known as:  MIRALAX / GLYCOLAX Take 17 g by mouth daily.   SALONPAS EX Apply 1 patch topically as needed (for pain).   senna 8.6 MG Tabs tablet Commonly known as:  SENOKOT Take 1 tablet (8.6 mg total) by mouth 2 (two) times daily.   tiZANidine 4 MG tablet Commonly known as:  ZANAFLEX Take 4-8 mg by mouth daily as needed for muscle spasms. 1- 2 tablets        Vitals:   05/02/16 1123  BP: 120/76  Pulse: 86  Resp: 18  Temp: 97.8 F (36.6 C)  SpO2: 95%  Weight: 143 lb 9.6 oz (65.1 kg)  Height: 5' (1.524 m)   Body mass  index is 28.04 kg/m.  Physical Exam  GENERAL APPEARANCE: Alert, conversant. No acute distress.  HEENT: Unremarkable. RESPIRATORY: Breathing is even, unlabored. Lung sounds are clear   CARDIOVASCULAR: Heart RRR no murmurs, rubs or gallops. No peripheral edema.  GASTROINTESTINAL: Abdomen is soft, non-tender, not distended w/ normal bowel sounds.  NEUROLOGIC: Cranial nerves 2-12 grossly intact. Moves all extremities   Labs reviewed: Basic Metabolic Panel:  Recent Labs  04/06/16 0338  04/08/16 1014 04/11/16 04/11/16 0408 04/18/16  NA  138  < > 137 138 138 139  K 4.5  < > 3.7  --  4.3 4.0  CL 103  --  104  --  104  --   CO2 26  --  24  --  26  --   GLUCOSE 120*  --  94  --  119*  --   BUN 23*  < > 28* 31* 31* 24*  CREATININE 0.88  < > 0.76 0.9 0.94 0.9  CALCIUM 8.9  --  9.1  --  8.9  --   < > = values in this interval not displayed. No results found for: [facility] Liver Function Tests:  Recent Labs  11/11/15 1623 04/02/16 1723 04/06/16 0338  AST 14 22 21   ALT 14 19 18   ALKPHOS 80 55 51  BILITOT 0.5 0.7 0.5  PROT 7.4 7.0 5.8*  ALBUMIN 4.2 4.0 3.1*    Recent Labs  04/02/16 1723  LIPASE 31   No results for input(s): AMMONIA in the last 8760 hours. CBC:  Recent Labs  11/11/15 1623 04/02/16 1723  04/06/16 0338 04/11/16 04/11/16 0408 04/18/16  WBC 9.3 7.0  < > 7.5 7.6 7.6 8.5  NEUTROABS 7.0 5.5  --   --   --  6.3  --   HGB 12.4 13.9  < > 12.6  --  12.0 12.4  HCT 36.5 42.1  < > 38.0  --  36.1 37  MCV 93.1 93.1  --  91.3  --  90.0  --   PLT 390.0 317  < > 288  --  332 381  < > = values in this interval not displayed. Lipid  Recent Labs  08/07/15 1127  CHOL 240*  HDL 70.10  LDLCALC 147*  TRIG 116.0   Cardiac Enzymes: No results for input(s): CKTOTAL, CKMB, CKMBINDEX, TROPONINI in the last 8760 hours. BNP:  Recent Labs  04/02/16 1723  BNP 72.5   CBG: No results for input(s): GLUCAP in the last 8760 hours.  Procedures and Imaging Studies During  Stay: Dg Chest 2 View  Result Date: 04/02/2016 CLINICAL DATA:  Severe back pain.  Left-sided chest pain today. EXAM: CHEST  2 VIEW COMPARISON:  Chest radiograph 11/11/2015, spine MRI 10/15/2015 FINDINGS: The cardiac silhouette is enlarged. Mediastinal contours appear intact. There is no evidence of pneumothorax. There has been interval development of moderate in size left pleural effusion. Smaller right subpulmonic effusion is also possible. There is mild interstitial prominence bilaterally. If there is a significantly exaggerated thoracic kyphosis centered in the second third of the thoracic spine. The thoracic vertebrae are not well evaluated due to osteopenia and positioning, but new or advanced compression fracture is suspected based on the exaggerated kyphotic deformity. IMPRESSION: Moderate in size left pleural effusion. Possible small right subpulmonic pleural effusion. Mild pulmonary vascular congestion. Significantly exaggerated thoracic kyphosis when compared to patient's prior chest radiograph and MRI,which is suggestive of new or worsening compression fracture of one or more thoracic vertebral bodies.The thoracic vertebrae are not well evaluated due to osteopenia and positioning. Patient has a known T8, T9, and T11 expansile lesions with associated pathologic fractures of T8 and T9 demonstrated by MRI in April 2017. Electronically Signed   By: Fidela Salisbury M.D.   On: 04/02/2016 17:42   Ct Head Wo Contrast  Result Date: 04/02/2016 CLINICAL DATA:  Severe back pain. Compression fracture diagnosed in April. Pain is worsening today. EXAM: CT HEAD WITHOUT CONTRAST CT CERVICAL SPINE WITHOUT CONTRAST TECHNIQUE: Multidetector CT  imaging of the head and cervical spine was performed following the standard protocol without intravenous contrast. Multiplanar CT image reconstructions of the cervical spine were also generated. COMPARISON:  CT head 11/30/2006 FINDINGS: CT HEAD FINDINGS Brain: Mild diffuse  cerebral atrophy. Ventricular dilatation consistent with central atrophy. Low-attenuation changes in the deep white matter consistent with small vessel ischemia. No evidence of acute infarction, hemorrhage, hydrocephalus, extra-axial collection or mass lesion/mass effect. Vascular: No hyperdense vessel or unexpected calcification. Skull: Normal. Negative for fracture or focal lesion. Sinuses/Orbits: No acute finding. Degenerative changes in the temporomandibular joints. Other: No significant changes since previous study. CT CERVICAL SPINE FINDINGS Alignment: Slight anterior subluxation of C7 on T1 is likely degenerative. Otherwise normal alignment of the cervical spine. Skull base and vertebrae: No acute fracture. No primary bone lesion or focal pathologic process. Soft tissues and spinal canal: No prevertebral fluid or swelling. No visible canal hematoma. Disc levels: Degenerative changes throughout the cervical spine with narrowed interspaces and endplate hypertrophic changes. Degenerative disc disease at C5-6 and C6-7 levels. Prominent posterior osteophytes at C5-6 and C6-7 cause some impression upon the central canal. C1-2 articulation appears intact. Congenital nonunion of the posterior arch of C1. Degenerative changes throughout the cervical facet joints with posterior coalition at C4-5. Upper chest: There is a fracture of the posterior right first rib but associated with a mildly expansile lucent lesions suggesting a pathologic fracture. Appearance is suspicious for metastasis. Other: None. IMPRESSION: Diffuse degenerative changes throughout the cervical spine. No acute displaced cervical spine fractures identified. There is a pathologic fracture of the right posterior first rib. Electronically Signed   By: Lucienne Capers M.D.   On: 04/02/2016 20:01   Ct Chest W Contrast  Result Date: 04/02/2016 CLINICAL DATA:  Pt reports to the ED for eval of back pain. Pt had some compression fx diagnosed in April.  The pain got worse last night and into today. Pt able to move all extremities. Pt denies any numbness, tingling, paralysis, or bowel or bladder incontinence EXAM: CT CHEST, ABDOMEN, AND PELVIS WITH CONTRAST TECHNIQUE: Multidetector CT imaging of the chest, abdomen and pelvis was performed following the standard protocol during bolus administration of intravenous contrast. CONTRAST:  100 mL of Isovue-300 intravenous contrast COMPARISON:  Chest CT, 08/22/2014 FINDINGS: CT CHEST FINDINGS Cardiovascular: Heart is normal in size. There are mild coronary artery calcifications. Great vessels normal in caliber. Mild atherosclerotic plaque is noted along the aortic arch and descending thoracic aorta. No significant stenosis. Mediastinum/Nodes: 14 mm nodule lies in the posterior left thyroid lobe, stable. No mediastinal or hilar masses or enlarged lymph nodes. Lungs/Pleura: Small left a minimal right pleural effusions. There is dependent opacity at the lung bases, most notably in the left lower lobe. This is likely atelectasis. Pneumonia is possible. There is no evidence of pulmonary edema. No lung mass or suspicious nodule. No pneumothorax. CT ABDOMEN PELVIS FINDINGS Hepatobiliary: Liver is unremarkable. There is intra and extra hepatic bile duct dilation. Common bile duct measures 12 mm in greatest dimension. No convincing duct stone. Gallbladder surgically absent. Pancreas: No pancreatic mass or inflammation. Mild dilation of the pancreatic duct. Spleen: Normal in size without focal abnormality. Adrenals/Urinary Tract: Mild bilateral renal cortical thinning. No renal masses or stones. No hydronephrosis. Ureters normal in course and in caliber. Bladder is unremarkable. Stomach/Bowel: Stomach and small bowel are unremarkable. Mild increased stool in the colon. No colonic wall thickening or inflammatory change. Appendix not visualized. No evidence of appendicitis. Vascular/Lymphatic: Mild atherosclerotic calcifications noted  along a normal caliber abdominal aorta. No enlarged lymph nodes. Reproductive: Status post hysterectomy. No adnexal masses. Other: No abdominal wall hernia or abnormality. No abdominopelvic ascites. MUSCULOSKELETAL FINDINGS There is a destructive mass arising from the T8 and T9 vertebrae extending to involve the lower aspect of the T7 vertebra and the upper endplate of W40 as well. Mass measures 4.1 x 5.1 x 4.9 cm in size. It extends posteriorly into the spinal canal to cause severe central spinal stenosis. It also extends into the neural foramina, right greater than left causing significant narrowing. There are no other bone lesions. The bones are diffusely demineralized. IMPRESSION: 1. There is a destructive spinal mass centered on the T8 and T9 vertebrae, but also involving the lower aspect of the T7 vertebra and upper aspect of T10. The mass causes severe central stenosis and significant narrowing of the neural foramina. This mass may reflect a primary bone neoplasm such as a plasmacytoma. A metastatic lesion is possible although a primary neoplasm is not visualized. Followup thoracic spine MRI with and without contrast to assess the extent of cord compression is recommended. 2. No other bone lesions. 3. Small left and minimal right pleural effusions. There is associated dependent atelectasis most evident in the left lower lobe. 4. No acute abnormalities below the diaphragm. Electronically Signed   By: Lajean Manes M.D.   On: 04/02/2016 20:10   Ct Cervical Spine Wo Contrast  Result Date: 04/02/2016 CLINICAL DATA:  Severe back pain. Compression fracture diagnosed in April. Pain is worsening today. EXAM: CT HEAD WITHOUT CONTRAST CT CERVICAL SPINE WITHOUT CONTRAST TECHNIQUE: Multidetector CT imaging of the head and cervical spine was performed following the standard protocol without intravenous contrast. Multiplanar CT image reconstructions of the cervical spine were also generated. COMPARISON:  CT head  11/30/2006 FINDINGS: CT HEAD FINDINGS Brain: Mild diffuse cerebral atrophy. Ventricular dilatation consistent with central atrophy. Low-attenuation changes in the deep white matter consistent with small vessel ischemia. No evidence of acute infarction, hemorrhage, hydrocephalus, extra-axial collection or mass lesion/mass effect. Vascular: No hyperdense vessel or unexpected calcification. Skull: Normal. Negative for fracture or focal lesion. Sinuses/Orbits: No acute finding. Degenerative changes in the temporomandibular joints. Other: No significant changes since previous study. CT CERVICAL SPINE FINDINGS Alignment: Slight anterior subluxation of C7 on T1 is likely degenerative. Otherwise normal alignment of the cervical spine. Skull base and vertebrae: No acute fracture. No primary bone lesion or focal pathologic process. Soft tissues and spinal canal: No prevertebral fluid or swelling. No visible canal hematoma. Disc levels: Degenerative changes throughout the cervical spine with narrowed interspaces and endplate hypertrophic changes. Degenerative disc disease at C5-6 and C6-7 levels. Prominent posterior osteophytes at C5-6 and C6-7 cause some impression upon the central canal. C1-2 articulation appears intact. Congenital nonunion of the posterior arch of C1. Degenerative changes throughout the cervical facet joints with posterior coalition at C4-5. Upper chest: There is a fracture of the posterior right first rib but associated with a mildly expansile lucent lesions suggesting a pathologic fracture. Appearance is suspicious for metastasis. Other: None. IMPRESSION: Diffuse degenerative changes throughout the cervical spine. No acute displaced cervical spine fractures identified. There is a pathologic fracture of the right posterior first rib. Electronically Signed   By: Lucienne Capers M.D.   On: 04/02/2016 20:01   Ct Abdomen Pelvis W Contrast  Result Date: 04/02/2016 CLINICAL DATA:  Pt reports to the ED for  eval of back pain. Pt had some compression fx diagnosed in April. The pain got  worse last night and into today. Pt able to move all extremities. Pt denies any numbness, tingling, paralysis, or bowel or bladder incontinence EXAM: CT CHEST, ABDOMEN, AND PELVIS WITH CONTRAST TECHNIQUE: Multidetector CT imaging of the chest, abdomen and pelvis was performed following the standard protocol during bolus administration of intravenous contrast. CONTRAST:  100 mL of Isovue-300 intravenous contrast COMPARISON:  Chest CT, 08/22/2014 FINDINGS: CT CHEST FINDINGS Cardiovascular: Heart is normal in size. There are mild coronary artery calcifications. Great vessels normal in caliber. Mild atherosclerotic plaque is noted along the aortic arch and descending thoracic aorta. No significant stenosis. Mediastinum/Nodes: 14 mm nodule lies in the posterior left thyroid lobe, stable. No mediastinal or hilar masses or enlarged lymph nodes. Lungs/Pleura: Small left a minimal right pleural effusions. There is dependent opacity at the lung bases, most notably in the left lower lobe. This is likely atelectasis. Pneumonia is possible. There is no evidence of pulmonary edema. No lung mass or suspicious nodule. No pneumothorax. CT ABDOMEN PELVIS FINDINGS Hepatobiliary: Liver is unremarkable. There is intra and extra hepatic bile duct dilation. Common bile duct measures 12 mm in greatest dimension. No convincing duct stone. Gallbladder surgically absent. Pancreas: No pancreatic mass or inflammation. Mild dilation of the pancreatic duct. Spleen: Normal in size without focal abnormality. Adrenals/Urinary Tract: Mild bilateral renal cortical thinning. No renal masses or stones. No hydronephrosis. Ureters normal in course and in caliber. Bladder is unremarkable. Stomach/Bowel: Stomach and small bowel are unremarkable. Mild increased stool in the colon. No colonic wall thickening or inflammatory change. Appendix not visualized. No evidence of  appendicitis. Vascular/Lymphatic: Mild atherosclerotic calcifications noted along a normal caliber abdominal aorta. No enlarged lymph nodes. Reproductive: Status post hysterectomy. No adnexal masses. Other: No abdominal wall hernia or abnormality. No abdominopelvic ascites. MUSCULOSKELETAL FINDINGS There is a destructive mass arising from the T8 and T9 vertebrae extending to involve the lower aspect of the T7 vertebra and the upper endplate of Z61 as well. Mass measures 4.1 x 5.1 x 4.9 cm in size. It extends posteriorly into the spinal canal to cause severe central spinal stenosis. It also extends into the neural foramina, right greater than left causing significant narrowing. There are no other bone lesions. The bones are diffusely demineralized. IMPRESSION: 1. There is a destructive spinal mass centered on the T8 and T9 vertebrae, but also involving the lower aspect of the T7 vertebra and upper aspect of T10. The mass causes severe central stenosis and significant narrowing of the neural foramina. This mass may reflect a primary bone neoplasm such as a plasmacytoma. A metastatic lesion is possible although a primary neoplasm is not visualized. Followup thoracic spine MRI with and without contrast to assess the extent of cord compression is recommended. 2. No other bone lesions. 3. Small left and minimal right pleural effusions. There is associated dependent atelectasis most evident in the left lower lobe. 4. No acute abnormalities below the diaphragm. Electronically Signed   By: Lajean Manes M.D.   On: 04/02/2016 20:10   Ir Fluoro Guide Ndl Plmt / Bx  Result Date: 04/05/2016 INDICATION: Enlarging thoracic mass in the region of T8 and T9 vertebral bodies. EXAM: IR FLUORO GUIDE NEEDLE PLACEMENT /BIOPSY MEDICATIONS: None. ANESTHESIA/SEDATION: Moderate (conscious) sedation was employed during this procedure. A total of Versed 1 mg and Fentanyl 25 mcg was administered intravenously. Moderate Sedation Time: 15  minutes. The patient's level of consciousness and vital signs were monitored continuously by radiology nursing throughout the procedure under my direct supervision. FLUOROSCOPY  TIME:  Fluoroscopy Time: 6 minutes 12 seconds 388 mGy). COMPLICATIONS: None immediate. PROCEDURE: Informed written consent was obtained from the patient after a thorough discussion of the procedural risks, benefits and alternatives. All questions were addressed. Maximal Sterile Barrier Technique was utilized including caps, mask, sterile gowns, sterile gloves, sterile drape, hand hygiene and skin antiseptic. A timeout was performed prior to the initiation of the procedure. The patient was laid prone on the fluoroscopic table. The skin overlying the mid thoracic region was then prepped and draped in the usual sterile fashion. The skin entry site overlying the right pedicle at T9 was then infiltrated with 0.25% bupivacaine and carried to the pedicle itself. Using biplane intermittent fluoroscopy, a 13 gauge Cook spinal needle was then advanced through the destroyed right pedicle and posterior to the mid one-third at T9. Using a 16 gauge core biopsy needle and a 20 mL syringe, samples were obtained with 2 different core biopsy needles for pathologic analysis. Finally aspiration was also performed with a 20 mL syringe through the 13 gauge Cook spinal needle. Specimens obtained were sent together in the same vile for pathologic analysis. The needle was removed and hemostasis was achieved at the skin entry site. The patient tolerated the procedure well. There were no acute complications. At the end of the procedure the patient left her room in a stable condition without change in the upper or lower extremity motor function. IMPRESSION: Status post fluoro guided deep core bone biopsies at T9 for a destructive soft mass. Electronically Signed   By: Luanne Bras M.D.   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 Bone marrow biopsy is warranted to evaluate for marrow involvement. EXAM: CT GUIDED BONE MARROW ASPIRATION AND CORE BIOPSY Interventional Radiologist:  Criselda Peaches, MD MEDICATIONS: None. ANESTHESIA/SEDATION: Moderate (conscious) sedation was employed during this procedure. A total of 1.5 milligrams versed and 75 micrograms fentanyl were administered intravenously. The patient's level of consciousness and vital signs were monitored continuously by radiology nursing throughout the procedure under my direct supervision. Total monitored sedation time: 10 minutes FLUOROSCOPY TIME:  Fluoroscopy Time: 0 minutes 0 seconds (0 mGy). COMPLICATIONS: None immediate. Estimated blood loss: <25 mL PROCEDURE: Informed written consent was obtained from the patient after a thorough discussion of the procedural risks, benefits and alternatives. All questions were addressed. Maximal Sterile Barrier Technique was utilized including caps, mask, sterile gowns, sterile gloves, sterile drape, hand hygiene and skin antiseptic. A timeout was performed prior to the initiation of the procedure. The patient was positioned prone and non-contrast localization CT was performed of the pelvis to demonstrate the iliac marrow spaces. Maximal barrier sterile technique utilized including caps, mask, sterile gowns, sterile gloves, large sterile drape, hand hygiene, and betadine prep. Under sterile conditions and local anesthesia, an 11 gauge coaxial bone biopsy needle was advanced into the right iliac marrow space. Needle position was confirmed with CT imaging. Initially, bone marrow aspiration was performed. Next, the 11 gauge outer cannula was utilized to obtain a right iliac bone marrow core biopsy. Needle was removed. Hemostasis was obtained with compression. The patient tolerated the procedure well. Samples were prepared with the cytotechnologist. IMPRESSION: Technically  successful CT-guided bone marrow biopsy of the right iliac bone. Signed, Criselda Peaches, MD [facility] Electronically Signed   By: Jacqulynn Cadet M.D.   On: 04/11/2016 09:53   Dg Bone Survey Met  Result Date: 04/08/2016 CLINICAL DATA:  Recent diagnosis of myeloma. History of a neck fracture according to the patient. EXAM: METASTATIC BONE SURVEY COMPARISON:  None in PACs FINDINGS: Calvarium and spine: No lytic nor blastic lesions are noted within the calvarium or spine. There are compression fractures in the mid to lower thoracic spine with prominent kyphosis. However, fine detail of these fractures is lacking due to osteopenia. Upper extremities: No lytic nor blastic lesions are observed. There are degenerative changes of the first [facility] joint on the right. Ribs and chest: The lungs are hypoinflated. There is increased density at the left lung base. There is thickening of the minor fissure. There is a fracture of the posterior aspect of the left seventh rib. There is calcification in the wall of the aortic arch. The cardiac silhouette is enlarged. The pulmonary vascularity is not engorged. Abdomen and pelvis: The colonic stool burden is moderate. There surgical clips in the gallbladder fossa. The bony pelvis is osteopenic but there is no lytic nor blastic lesion. Hips and lower extremities: The hip joint spaces are reasonably well-maintained. There is a 3 mm diameter lucency in the medial aspect of the left femoral neck. The remainder the lower extremities exhibit no suspicious findings. IMPRESSION: Mid thoracic compression fractures which are poorly evaluated on this study due to osteopenia and overlying densities. Fracture the posterior aspect of the left seventh rib which may be pathologic in nature. 3 mm diameter lucency in the medial aspect of the left femoral neck. Bibasilar atelectasis or pneumonia greatest on the left. Cardiomegaly  without pulmonary edema. Aortic atherosclerosis. Electronically Signed   By: David  Martinique M.D.   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  Social services at facility was responsible for arranging this appointment.  Pt was provided with a 30 day supply of prescriptions for medications and refills must be obtained from their PCP.  For controlled substances, a more limited supply may be provided adequate until PCP appointment only.   Time spent > 30 min;> 50% of time with patient was spent reviewing records, labs, tests and studies, counseling and developing plan of care  Mikayla Delaine. Sheppard Coil, MD

## 2016-05-03 ENCOUNTER — Ambulatory Visit
Admission: RE | Admit: 2016-05-03 | Discharge: 2016-05-03 | Disposition: A | Discharge status: Home / Self Care | Payer: Medicare Other | Source: Ambulatory Visit | Attending: Radiation Oncology | Admitting: Radiation Oncology

## 2016-05-03 ENCOUNTER — Telehealth: Payer: Self-pay | Admitting: Hematology

## 2016-05-03 ENCOUNTER — Encounter: Payer: Self-pay | Admitting: Hematology

## 2016-05-03 ENCOUNTER — Ambulatory Visit (HOSPITAL_BASED_OUTPATIENT_CLINIC_OR_DEPARTMENT_OTHER): Payer: Medicare Other | Admitting: Hematology

## 2016-05-03 VITALS — BP 171/74 | HR 92 | Temp 98.2°F | Resp 17 | Ht 60.0 in | Wt 140.8 lb

## 2016-05-03 DIAGNOSIS — C9030 Solitary plasmacytoma not having achieved remission: Secondary | ICD-10-CM

## 2016-05-03 DIAGNOSIS — C7951 Secondary malignant neoplasm of bone: Secondary | ICD-10-CM | POA: Diagnosis not present

## 2016-05-03 DIAGNOSIS — M549 Dorsalgia, unspecified: Secondary | ICD-10-CM | POA: Diagnosis not present

## 2016-05-03 DIAGNOSIS — G9520 Unspecified cord compression: Secondary | ICD-10-CM | POA: Diagnosis not present

## 2016-05-03 DIAGNOSIS — G893 Neoplasm related pain (acute) (chronic): Secondary | ICD-10-CM | POA: Diagnosis not present

## 2016-05-03 DIAGNOSIS — Z510 Encounter for antineoplastic radiation therapy: Secondary | ICD-10-CM | POA: Diagnosis not present

## 2016-05-03 NOTE — Telephone Encounter (Signed)
All appointments scheduled per 05/03/16 los. AVS report and appointment schedule given to patient, per 05/03/16 los.

## 2016-05-03 NOTE — Progress Notes (Signed)
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 There were additional smaller lesions in the T11 pedicles bilaterally.      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 The mass causes severe central stenosis and significant narrowing of the neural foramina.      04/04/2016 Initial Biopsy    CT biopsy of T9 on 04/04/2016 showed plasma cell neoplasm.      04/06/2016 -  Radiation Therapy    Radiation to the thoracic spine initiated 04/06/2016.       04/11/2016 Bone Marrow Biopsy    Bone marrow biopsy 04/11/2016 showed a hypercellular bone marrow with plasma cell neoplasm. The plasma cells represent 13% of all cells with lack of large aggregates or sheets.       CURRENT THERAPY: radiation   INTERVAL HISTORY: Mikayla Marks returns for follow-up, she is accompanied by her daughter [name] today. She has been tolerating radiation very well, is going to finish tomorrow Still has mild to moderate pain in the thoracic spine, her spine seemed to be more curved daily No other new complaints  REVIEW OF SYSTEMS:   Constitutional: Denies fevers, chills or abnormal weight loss, (+) fatigue  Eyes: Denies blurriness of vision Ears, nose, mouth, throat, and face: Denies mucositis or sore throat Respiratory: Denies cough, dyspnea or wheezes Cardiovascular:  Denies palpitation, chest discomfort or lower extremity swelling Gastrointestinal:  Denies nausea, heartburn or change in bowel habits Skin: Denies abnormal skin rashes Lymphatics: Denies new lymphadenopathy or easy bruising Neurological:Denies numbness, tingling or new weaknesses, (+) mild to moderate pain in the thoracic spine Behavioral/Psych: Mood is stable, no new changes  All other systems were reviewed with the patient and are negative.  MEDICAL HISTORY:  Past Medical History:  Diagnosis Date  . Carotid artery stenosis   . Chronic kidney disease   . Hyperlipidemia   . HYPERTENSION   . Osteoporosis   . Paranoid schizophrenia ([place])   . SCC (squamous cell carcinoma)    in situ R pretibial area  . Schizoaffective disorder ([place])     SURGICAL HISTORY: Past Surgical History:  Procedure Laterality Date  . BREAST BIOPSY     benign tumor  . CATARACT EXTRACTION    . CHOLECYSTECTOMY    . IR GENERIC HISTORICAL  04/04/2016   IR FLUORO GUIDED NEEDLE PLC ASPIRATION/INJECTION LOC 04/04/2016 Luanne Bras, MD [facility]-INTERV [facility]  . LASER ABLATION Left    left LE   . LEG SURGERY Right 10/2012   Ruptured Veins  . PARTIAL HYSTERECTOMY     due to bleeding  . TONSILLECTOMY      I have reviewed the social history and family history with the patient and they are unchanged from previous note.  ALLERGIES:  is allergic to other; bacitracin-polymyxin b; and penicillins.  MEDICATIONS:  Current Outpatient Prescriptions  Medication Sig Dispense Refill  . Calcium Carbonate (CALTRATE  600 PO) Take 1 tablet by mouth daily.     . cycloSPORINE (RESTASIS) 0.05 % ophthalmic emulsion Place 1 drop into both eyes 2 (two) times daily.    Marland Kitchen dexamethasone (DECADRON) 4 MG tablet Take 1 tablet (4 mg total) by mouth daily. 10 tablet 0  . docusate sodium (COLACE) 100 MG capsule Take 1 capsule (100 mg total) by mouth 2 (two) times daily. 10 capsule 0  . feeding supplement, ENSURE ENLIVE, (ENSURE ENLIVE) LIQD Take  237 mLs by mouth 2 (two) times daily between meals. 237 mL 12  . gabapentin (NEURONTIN) 100 MG capsule Take 1 capsule (100 mg total) by mouth 2 (two) times daily.    . Liniments (SALONPAS EX) Apply 1 patch topically as needed (for pain).    . methocarbamol (ROBAXIN) 500 MG tablet Take 1 tablet (500 mg total) by mouth every 8 (eight) hours as needed for muscle spasms.    . Multiple Vitamins-Minerals (MULTIVITAMIN WITH MINERALS) tablet Take 1 tablet by mouth 4 (four) times a week.     Marland Kitchen oxyCODONE (OXY IR/ROXICODONE) 5 MG immediate release tablet Take 1 tablet (5 mg total) by mouth every 4 (four) hours as needed for moderate pain. 20 tablet 0  . polyethylene glycol (MIRALAX / GLYCOLAX) packet Take 17 g by mouth daily. 14 each 0  . senna (SENOKOT) 8.6 MG TABS tablet Take 1 tablet (8.6 mg total) by mouth 2 (two) times daily. 120 each 0  . tiZANidine (ZANAFLEX) 4 MG tablet Take 4-8 mg by mouth daily as needed for muscle spasms. 1- 2 tablets     No current facility-administered medications for this visit.     PHYSICAL EXAMINATION: ECOG PERFORMANCE STATUS: 3 - Symptomatic, >50% confined to bed  Vitals:   05/03/16 1505  BP: (!) 171/74  Pulse: 92  Resp: 17  Temp: 98.2 F (36.8 C)   Filed Weights   05/03/16 1505  Weight: 140 lb 12.8 oz (63.9 kg)    GENERAL:alert, no distress and comfortable SKIN: skin color, texture, turgor are normal, no rashes or significant lesions EYES: normal, Conjunctiva are pink and non-injected, sclera clear OROPHARYNX:no exudate, no erythema and lips, buccal mucosa, and tongue normal  NECK: supple, thyroid normal size, non-tender, without nodularity LYMPH:  no palpable lymphadenopathy in the cervical, axillary or inguinal LUNGS: clear to auscultation and percussion with normal breathing effort HEART: regular rate & rhythm and no murmurs and no lower extremity edema ABDOMEN:abdomen soft, non-tender and normal bowel sounds Musculoskeletal:no cyanosis of digits and  no clubbing  NEURO: alert & oriented x 3 with fluent speech, no focal motor/sensory deficits  LABORATORY DATA:  I have reviewed the data as listed CBC Latest Ref Rng & Units 04/18/2016 04/11/2016 04/11/2016  WBC 10:3/mL 8.5 7.6 7.6  Hemoglobin 12.0 - 16.0 g/dL 12.4 12.0 -  Hematocrit 36 - 46 % 37 36.1 -  Platelets 150 - 399 K/L 381 332 -     CMP Latest Ref Rng & Units 04/18/2016 04/11/2016 04/11/2016  Glucose 65 - 99 mg/dL - 119(H) -  BUN 4 - 21 mg/dL 24(A) 31(H) 31(A)  Creatinine 0.5 - 1.1 mg/dL 0.9 0.94 0.9  Sodium 137 - 147 mmol/L 139 138 138  Potassium 3.4 - 5.3 mmol/L 4.0 4.3 -  Chloride 101 - 111 mmol/L - 104 -  CO2 22 - 32 mmol/L - 26 -  Calcium 8.9 - 10.3 mg/dL - 8.9 -  Total Protein 6.5 - 8.1 g/dL - - -  Total Bilirubin 0.3 -  1.2 mg/dL - - -  Alkaline Phos 38 - 126 U/L - - -  AST 15 - 41 U/L - - -  ALT 14 - 54 U/L - - -   PATHOLOGY REPORT  Diagnosis 04/11/2016 Bone Marrow, Aspirate,Biopsy, and Clot, right iliac - HYPERCELLULAR BONE MARROW WITH PLASMA CELL NEOPLASM - TRILINEAGE HEMATOPOIESIS. - SEE COMMENT. PERIPHERAL BLOOD: - OCCASIONAL HYPERSEGMENTED NEUTROPHILS. Diagnosis Note The bone marrow is hypercellular for age with increased number of plasma cells representing 13% of all cells with lack of large aggregates or sheets. Immunohistochemical stains show that the plasma cells are kappa light chain restricted consistent with plasma cell neoplasm. The background shows trilineage hematopoiesis and essentially orderly and progressive maturation of all myeloid cell types. Occasional hypersegmented neutrophils or atypical megakaryocytes are seen. The latter changes are considered minimal and nonspecific and may be related to medication, nutritional deficiency, etc, but correlation with cytogenetic studies is recommended. (BNS:ecj 04/21/2016)  Cytogenetics: normal   RADIOGRAPHIC STUDIES: I have personally reviewed the radiological images as listed and agreed with  the findings in the report. No results found.   ASSESSMENT & PLAN:  82 y.o. with a past medical history significant for paranoid schizophrenia and HTNwho presents with worseningback pain for one year, much worse for the past 6 months.   1. 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 Deedra Ehrich also  -Her bone marrow biopsy didn't show 13% plasma cells, however her SPEP with immunofixation is negative, she has elevated Kappy light chain in serum and urine  -She has biopsy proved plasmacytoma involving T8 and T9, no ulcer bone lesions on MRI scan -She has received a definitive dose of radiation to her thoracic plasmacytoma, tolerated well -Giving her advanced age and medical comorbidities, especially her schizophrenia, I do not recommend any systemic therapy for her multiple myeloma for now, we'll continue observation -If she develops multiple myeloma-related new bone lesion or other organ damage, I would consider multiple myeloma treatment.  2. Schizophrenia -on meds, stable   3. Hypertension -continue medication   4. Back pain secondary to #1 -I will ask IR Dr. Estanislado Pandy to review her spinal MRI, to see if she would benefit from kyphoplasty procedure. Pt is agreeable if there is clinical benefit   Follow up -RTC in 2 month with lab   All questions were answered. The patient knows to call the clinic with any problems, questions or concerns. No barriers to learning was detected.  I spent 25 minutes counseling the patient face to face. The total time spent in the appointment was 30 minutes and more than 50% was on counseling and review of test results     Truitt Merle, MD 05/03/16

## 2016-05-04 ENCOUNTER — Encounter: Payer: Self-pay | Admitting: Radiation Oncology

## 2016-05-04 ENCOUNTER — Ambulatory Visit
Admission: RE | Admit: 2016-05-04 | Discharge: 2016-05-04 | Disposition: A | Discharge status: Home / Self Care | Payer: Medicare Other | Source: Ambulatory Visit | Attending: Radiation Oncology | Admitting: Radiation Oncology

## 2016-05-04 ENCOUNTER — Encounter: Payer: Self-pay | Admitting: Hematology

## 2016-05-04 DIAGNOSIS — Z510 Encounter for antineoplastic radiation therapy: Secondary | ICD-10-CM | POA: Diagnosis not present

## 2016-05-04 DIAGNOSIS — C7951 Secondary malignant neoplasm of bone: Secondary | ICD-10-CM | POA: Diagnosis not present

## 2016-05-04 DIAGNOSIS — C9030 Solitary plasmacytoma not having achieved remission: Secondary | ICD-10-CM | POA: Diagnosis not present

## 2016-05-04 DIAGNOSIS — G9520 Unspecified cord compression: Secondary | ICD-10-CM | POA: Diagnosis not present

## 2016-05-05 NOTE — Progress Notes (Signed)
  Radiation Oncology         [phone] ________________________________  Name: Mikayla Marks MRN: [id number]  Date: 05/04/2016  DOB: [birth year]  End of Treatment Note  Diagnosis:   82 yo woman with thoracic spine plasmacytoma causing cord compression     Indication for treatment:  Palliative       Radiation treatment dates:   04/06/2016 to 05/04/2016  Site/dose:   1. Thoracic spine Plasmacytoma 1 was treated to 20 Gy in 10 fractions at 2 Gy per fraction. 2. Thoracic spine Plasmacytoma 2 was treated to 20 Gy in 10 fractions at 2 Gy per fraction for a total of 40 Gy  Beams/energy:    1. Isodose plan // 15X 2. 3D // 15X  Narrative: The patient tolerated radiation treatment relatively well. The patient experienced middle back pain that radiated around to her left ribs when she lifted her feet to get in to bed otherwise, pain free. During the course of treatment, the patient began ambulating with PT regularly using a walker and was very pleased with her progress. She also experienced some discomfort with swallowing, but it was manageable.   Plan: The patient has completed radiation treatment. The patient will return to radiation oncology clinic for routine followup in one month. I advised her to call or return sooner if she has any questions or concerns related to her recovery or treatment. ________________________________  Sheral Apley. Tammi Klippel, M.D.   This document serves as a record of services personally performed by Tyler Pita, MD. It was created on his behalf by Arlyce Harman, a trained medical scribe. The creation of this record is based on the scribe's personal observations and the provider's statements to them. This document has been checked and approved by the attending provider.

## 2016-05-06 DIAGNOSIS — Z79891 Long term (current) use of opiate analgesic: Secondary | ICD-10-CM | POA: Diagnosis not present

## 2016-05-06 DIAGNOSIS — I129 Hypertensive chronic kidney disease with stage 1 through stage 4 chronic kidney disease, or unspecified chronic kidney disease: Secondary | ICD-10-CM | POA: Diagnosis not present

## 2016-05-06 DIAGNOSIS — F200 Paranoid schizophrenia: Secondary | ICD-10-CM | POA: Diagnosis not present

## 2016-05-06 DIAGNOSIS — N189 Chronic kidney disease, unspecified: Secondary | ICD-10-CM | POA: Diagnosis not present

## 2016-05-06 DIAGNOSIS — C9030 Solitary plasmacytoma not having achieved remission: Secondary | ICD-10-CM | POA: Diagnosis not present

## 2016-05-06 DIAGNOSIS — I2510 Atherosclerotic heart disease of native coronary artery without angina pectoris: Secondary | ICD-10-CM | POA: Diagnosis not present

## 2016-05-06 DIAGNOSIS — E785 Hyperlipidemia, unspecified: Secondary | ICD-10-CM | POA: Diagnosis not present

## 2016-05-06 DIAGNOSIS — M8458XD Pathological fracture in neoplastic disease, other specified site, subsequent encounter for fracture with routine healing: Secondary | ICD-10-CM | POA: Diagnosis not present

## 2016-05-06 DIAGNOSIS — Z7952 Long term (current) use of systemic steroids: Secondary | ICD-10-CM | POA: Diagnosis not present

## 2016-05-08 DIAGNOSIS — I2510 Atherosclerotic heart disease of native coronary artery without angina pectoris: Secondary | ICD-10-CM | POA: Diagnosis not present

## 2016-05-08 DIAGNOSIS — C9030 Solitary plasmacytoma not having achieved remission: Secondary | ICD-10-CM | POA: Diagnosis not present

## 2016-05-08 DIAGNOSIS — I129 Hypertensive chronic kidney disease with stage 1 through stage 4 chronic kidney disease, or unspecified chronic kidney disease: Secondary | ICD-10-CM | POA: Diagnosis not present

## 2016-05-08 DIAGNOSIS — F200 Paranoid schizophrenia: Secondary | ICD-10-CM | POA: Diagnosis not present

## 2016-05-08 DIAGNOSIS — M8458XD Pathological fracture in neoplastic disease, other specified site, subsequent encounter for fracture with routine healing: Secondary | ICD-10-CM | POA: Diagnosis not present

## 2016-05-08 DIAGNOSIS — N189 Chronic kidney disease, unspecified: Secondary | ICD-10-CM | POA: Diagnosis not present

## 2016-05-09 ENCOUNTER — Telehealth: Payer: Self-pay | Admitting: Internal Medicine

## 2016-05-09 NOTE — Telephone Encounter (Signed)
Please advise 

## 2016-05-09 NOTE — Telephone Encounter (Signed)
Caller name:Megan Relation to pt: [facility]-RN Call back number: [phone] Pharmacy:  Reason for call: RN [facility]) called stating pt is needing order for nursing to do visit to pt's for pain management and medication management. Please advise. It can be verbal orders.

## 2016-05-09 NOTE — Telephone Encounter (Signed)
LMOM w/ verbal orders for [facility]. Instructed to call if questions/concerns.

## 2016-05-09 NOTE — Telephone Encounter (Signed)
That is ok 

## 2016-05-10 ENCOUNTER — Telehealth: Payer: Self-pay | Admitting: Internal Medicine

## 2016-05-10 DIAGNOSIS — I2510 Atherosclerotic heart disease of native coronary artery without angina pectoris: Secondary | ICD-10-CM | POA: Diagnosis not present

## 2016-05-10 DIAGNOSIS — I129 Hypertensive chronic kidney disease with stage 1 through stage 4 chronic kidney disease, or unspecified chronic kidney disease: Secondary | ICD-10-CM | POA: Diagnosis not present

## 2016-05-10 DIAGNOSIS — M8458XD Pathological fracture in neoplastic disease, other specified site, subsequent encounter for fracture with routine healing: Secondary | ICD-10-CM | POA: Diagnosis not present

## 2016-05-10 DIAGNOSIS — F200 Paranoid schizophrenia: Secondary | ICD-10-CM | POA: Diagnosis not present

## 2016-05-10 DIAGNOSIS — N189 Chronic kidney disease, unspecified: Secondary | ICD-10-CM | POA: Diagnosis not present

## 2016-05-10 DIAGNOSIS — C9030 Solitary plasmacytoma not having achieved remission: Secondary | ICD-10-CM | POA: Diagnosis not present

## 2016-05-10 NOTE — Telephone Encounter (Signed)
Okay to give verbal orders?  ?

## 2016-05-10 NOTE — Telephone Encounter (Signed)
[facility] is calling to get verbal orders for PT for patient.     [facility] contact: [phone]

## 2016-05-10 NOTE — Telephone Encounter (Signed)
Okay to give verbals? Pt has hosp f/u appt scheduled 05/11/2016 at 1330.

## 2016-05-10 NOTE — Telephone Encounter (Signed)
Spoke w/ Richard, verbal orders given.

## 2016-05-11 ENCOUNTER — Encounter: Payer: Self-pay | Admitting: Internal Medicine

## 2016-05-11 ENCOUNTER — Ambulatory Visit (INDEPENDENT_AMBULATORY_CARE_PROVIDER_SITE_OTHER): Payer: Medicare Other | Admitting: Internal Medicine

## 2016-05-11 VITALS — BP 132/86 | HR 118 | Temp 97.6°F | Resp 16 | Ht 60.0 in | Wt 142.1 lb

## 2016-05-11 DIAGNOSIS — R52 Pain, unspecified: Secondary | ICD-10-CM | POA: Diagnosis not present

## 2016-05-11 DIAGNOSIS — C9030 Solitary plasmacytoma not having achieved remission: Secondary | ICD-10-CM

## 2016-05-11 DIAGNOSIS — R000 Tachycardia, unspecified: Secondary | ICD-10-CM

## 2016-05-11 MED ORDER — OXYCODONE HCL 5 MG PO TABS: 5.0000 mg | ORAL_TABLET | Freq: Three times a day (TID) | ORAL | 0 refills | Status: DC | PRN

## 2016-05-11 MED ORDER — METHOCARBAMOL 500 MG PO TABS: 500.0000 mg | ORAL_TABLET | Freq: Three times a day (TID) | ORAL | 0 refills | Status: DC | PRN

## 2016-05-11 MED ORDER — GABAPENTIN 100 MG PO CAPS: 100.0000 mg | ORAL_CAPSULE | Freq: Three times a day (TID) | ORAL | 3 refills | Status: DC

## 2016-05-11 NOTE — Progress Notes (Signed)
Subjective:    Patient ID: Mikayla Marks, female    DOB: [birth year], 81 y.o.   MRN: [id number]  DOS:  05/11/2016 Type of visit - description : Hospital follow-up, here with her daughter Interval history: Admitted to the hospital last month, had back pain for several months, gradually worse. The diagnosis was plasmacytoma w/ pathologic T8, T9 vertebral fracture. Non -operative candidate. Rx radiation treatment and palliative care. Was discharged to a SNF but is back home.  Review of Systems Currently is living at home for the last couple of weeks, under the care of her daughter. Takes 2 oxycodone a day but states she could take more. Denies fever chills. No nausea, vomiting, diarrhea. Constipation is not a major problem No chest pain, difficulty breathing or lower extremity edema. She is tachycardic but denies palpitations.  Past Medical History:  Diagnosis Date  . Carotid artery stenosis   . Chronic Marks disease   . Hyperlipidemia   . HYPERTENSION   . Osteoporosis   . Paranoid schizophrenia ([place])   . SCC (squamous cell carcinoma)    in situ R pretibial area  . Schizoaffective disorder [facility])     Past Surgical History:  Procedure Laterality Date  . BREAST BIOPSY     benign tumor  . CATARACT EXTRACTION    . CHOLECYSTECTOMY    . IR GENERIC HISTORICAL  04/04/2016   IR FLUORO GUIDED NEEDLE PLC ASPIRATION/INJECTION LOC 04/04/2016 Mikayla Bras, MD [facility]-INTERV RAD  . LASER ABLATION Left    left LE   . LEG SURGERY Right 10/2012   Ruptured Veins  . PARTIAL HYSTERECTOMY     due to bleeding  . TONSILLECTOMY      Social History   Social History  . Marital status: Single    Spouse name: N/A  . Number of children: 3  . Years of education: N/A   Occupational History  . not working    Social History Main Topics  . Smoking status: Never Smoker  . Smokeless tobacco: Never Used  . Alcohol use No  . Drug use: No  . Sexual activity: No   Other Topics Concern  .  Not on file   Social History Narrative   Daughter lives with her Mikayla Marks)  ; widow   Brother Mikayla Marks in [place], lives 5 min away [phone]              Medication List       Accurate as of 05/11/16 11:59 PM. Always use your most recent med list.          CALTRATE 600 PO Take 1 tablet by mouth daily.   cycloSPORINE 0.05 % ophthalmic emulsion Commonly known as:  RESTASIS Place 1 drop into both eyes 2 (two) times daily.   docusate sodium 100 MG capsule Commonly known as:  COLACE Take 1 capsule (100 mg total) by mouth 2 (two) times daily.   gabapentin 100 MG capsule Commonly known as:  NEURONTIN Take 1 capsule (100 mg total) by mouth 3 (three) times daily.   methocarbamol 500 MG tablet Commonly known as:  ROBAXIN Take 1 tablet (500 mg total) by mouth every 8 (eight) hours as needed for muscle spasms.   multivitamin with minerals tablet Take 1 tablet by mouth 4 (four) times a week.   oxyCODONE 5 MG immediate release tablet Commonly known as:  Oxy IR/ROXICODONE Take 1 tablet (5 mg total) by mouth 3 (three) times daily as needed for moderate pain.   SALONPAS  EX Apply 1 patch topically as needed (for pain).   senna 8.6 MG Tabs tablet Commonly known as:  SENOKOT Take 1 tablet (8.6 mg total) by mouth 2 (two) times daily.          Objective:   Physical Exam BP 132/86 (BP Location: Left Arm, Patient Position: Sitting, Cuff Size: Large)   Pulse (!) 118   Temp 97.6 F (36.4 C) (Oral)   Resp 16   Ht 5' (1.524 m)   Wt 142 lb 2 oz (64.5 kg)   SpO2 97%   BMI 27.76 kg/m  General:   Well developed, well nourished . NAD.  HEENT:  Normocephalic . Face symmetric, atraumatic Lungs:  CTA B Normal respiratory effort, no intercostal retractions, no accessory muscle use. Heart: Tachycardic,  no murmur.  Trace pretibial edema bilaterally , calves symmetric Back: + Deformities of the thoracic spine consistent with diagnosis of multiple myeloma, skin is hyperpigmented  without open sores. + Kyphosis. Neurologic:  alert & oriented X3.  Speech normal, gait very limited and assisted in by rolling walker Psych--  Cognition and judgment appear intact.  Cooperative with normal attention span and concentration.  Behavior appropriate. No anxious or depressed appearing.      Assessment & Plan:   Assessment HTN Hyperlipidemia Schizophrenia Osteoporosis : Per DEXA 2010, DEXA 2013 with Dr. Amalia Hailey: Osteopenia. 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 Vitamin D deficiency Varicose pain, lower extremity edema: [place] (-) DVT 08-2014 SCC, R pretibial  PLAN  Plasmacytoma: f/u by hem-onc, pain control with oxycodone. Last visit with onc 05/03/2016, status post XRT, they recommend no systemic rx at this time. Pain control: Currently on oxycodone on average twice a day and gabapentin 100 mg twice a day. I recommend to increase oxycodone to 3 times a day prn and increase gabapentin, she is somewhat reluctant but eventually agreed to take gabapentin 100 mg 3 times a day. Oncology requested interventional radiology to eval her for possible vertebroplasty. Continue Robaxin. Refill sent. Tachycardia: She has tachycardia but asymptomatic. Observation for now Her daughter is here today with her, she is very pleasant and cooperative. RTC 2 months

## 2016-05-11 NOTE — Patient Instructions (Signed)
Take your medications as prescribed  Increase gabapentin to 3 times a day  Call for refills as needed  Next visit in 2 to 3  months

## 2016-05-11 NOTE — Progress Notes (Signed)
Pre visit review using our clinic review tool, if applicable. No additional management support is needed unless otherwise documented below in the visit note/SLS  

## 2016-05-12 ENCOUNTER — Telehealth: Payer: Self-pay | Admitting: Internal Medicine

## 2016-05-12 NOTE — Telephone Encounter (Signed)
Noted, thank you

## 2016-05-12 NOTE — Telephone Encounter (Signed)
[facility] nurse [phone]  She called in to make PCP aware that pt is declining nursing this week

## 2016-05-12 NOTE — Telephone Encounter (Signed)
FYI

## 2016-05-12 NOTE — Assessment & Plan Note (Signed)
Plasmacytoma: f/u by hem-onc, pain control with oxycodone. Last visit with onc 05/03/2016, status post XRT, they recommend no systemic rx at this time. Pain control: Currently on oxycodone on average twice a day and gabapentin 100 mg twice a day. I recommend to increase oxycodone to 3 times a day prn and increase gabapentin, she is somewhat reluctant but eventually agreed to take gabapentin 100 mg 3 times a day. Oncology requested interventional radiology to eval her for possible vertebroplasty. Continue Robaxin. Refill sent. Tachycardia: She has tachycardia but asymptomatic. Observation for now Her daughter is here today with her, she is very pleasant and cooperative. RTC 2 months

## 2016-05-13 ENCOUNTER — Telehealth: Payer: Self-pay | Admitting: Internal Medicine

## 2016-05-13 DIAGNOSIS — M8458XD Pathological fracture in neoplastic disease, other specified site, subsequent encounter for fracture with routine healing: Secondary | ICD-10-CM | POA: Diagnosis not present

## 2016-05-13 DIAGNOSIS — I2510 Atherosclerotic heart disease of native coronary artery without angina pectoris: Secondary | ICD-10-CM | POA: Diagnosis not present

## 2016-05-13 DIAGNOSIS — F200 Paranoid schizophrenia: Secondary | ICD-10-CM | POA: Diagnosis not present

## 2016-05-13 DIAGNOSIS — I129 Hypertensive chronic kidney disease with stage 1 through stage 4 chronic kidney disease, or unspecified chronic kidney disease: Secondary | ICD-10-CM | POA: Diagnosis not present

## 2016-05-13 DIAGNOSIS — C9030 Solitary plasmacytoma not having achieved remission: Secondary | ICD-10-CM | POA: Diagnosis not present

## 2016-05-13 DIAGNOSIS — N189 Chronic kidney disease, unspecified: Secondary | ICD-10-CM | POA: Diagnosis not present

## 2016-05-13 NOTE — Telephone Encounter (Signed)
FYI

## 2016-05-13 NOTE — Telephone Encounter (Signed)
[facility] called stating that patient decline  OT today.

## 2016-05-16 ENCOUNTER — Telehealth: Payer: Self-pay | Admitting: Internal Medicine

## 2016-05-16 DIAGNOSIS — I2510 Atherosclerotic heart disease of native coronary artery without angina pectoris: Secondary | ICD-10-CM | POA: Diagnosis not present

## 2016-05-16 DIAGNOSIS — M8458XD Pathological fracture in neoplastic disease, other specified site, subsequent encounter for fracture with routine healing: Secondary | ICD-10-CM | POA: Diagnosis not present

## 2016-05-16 DIAGNOSIS — N189 Chronic kidney disease, unspecified: Secondary | ICD-10-CM | POA: Diagnosis not present

## 2016-05-16 DIAGNOSIS — I129 Hypertensive chronic kidney disease with stage 1 through stage 4 chronic kidney disease, or unspecified chronic kidney disease: Secondary | ICD-10-CM | POA: Diagnosis not present

## 2016-05-16 DIAGNOSIS — C9030 Solitary plasmacytoma not having achieved remission: Secondary | ICD-10-CM | POA: Diagnosis not present

## 2016-05-16 DIAGNOSIS — F200 Paranoid schizophrenia: Secondary | ICD-10-CM | POA: Diagnosis not present

## 2016-05-16 NOTE — Telephone Encounter (Signed)
Spoke w/ Mikayla Marks, appt scheduled for tomorrow afternoon at 1PM. Also spoke w/ Mikayla Marks's daughter, [name], she informed that Mikayla Marks's blood pressures and pulses are lower after taking Oxycodone. Informed that BP and pulse could be elevated due to back pain, and once Oxycodone is taken which helps relieve the pain, the BP's go back to normal. Also, informed that it would be hard to say exactly w/o seeing BP numbers and pulses overtime but that they could discuss at appt tomorrow w/ PCP. [name] verbalized understanding.

## 2016-05-16 NOTE — Telephone Encounter (Signed)
Spoke with the patient physical therapist, she is at his office, not feeling up to par, somewhat fatigued. Heart rate is 115, O2 sat 93 after exertion. BP initially 160/100, now 120/80.  She does not look toxic or in distress. PT is mostly concerned about her heart rate. When we saw her last she was already slightly tachycardic. Recommend ER if severe symptoms, will call her and asked her to come back tomorrow for a checkup. Kaylyn,  please set an appointment for me to see her.

## 2016-05-17 ENCOUNTER — Telehealth: Payer: Self-pay

## 2016-05-17 ENCOUNTER — Ambulatory Visit (HOSPITAL_BASED_OUTPATIENT_CLINIC_OR_DEPARTMENT_OTHER)
Admission: RE | Admit: 2016-05-17 | Discharge: 2016-05-17 | Disposition: A | Discharge status: Home / Self Care | Payer: Medicare Other | Source: Ambulatory Visit | Attending: Internal Medicine | Admitting: Internal Medicine

## 2016-05-17 ENCOUNTER — Encounter: Payer: Self-pay | Admitting: Internal Medicine

## 2016-05-17 ENCOUNTER — Ambulatory Visit (INDEPENDENT_AMBULATORY_CARE_PROVIDER_SITE_OTHER): Payer: Medicare Other | Admitting: Internal Medicine

## 2016-05-17 VITALS — BP 137/68 | HR 105 | Temp 97.7°F | Resp 18 | Ht 60.0 in | Wt 143.2 lb

## 2016-05-17 DIAGNOSIS — R0602 Shortness of breath: Secondary | ICD-10-CM | POA: Diagnosis not present

## 2016-05-17 DIAGNOSIS — R000 Tachycardia, unspecified: Secondary | ICD-10-CM | POA: Insufficient documentation

## 2016-05-17 DIAGNOSIS — J90 Pleural effusion, not elsewhere classified: Secondary | ICD-10-CM | POA: Insufficient documentation

## 2016-05-17 DIAGNOSIS — R7989 Other specified abnormal findings of blood chemistry: Secondary | ICD-10-CM | POA: Diagnosis not present

## 2016-05-17 LAB — CBC WITH DIFFERENTIAL/PLATELET
BASOS PCT: 0.2 % (ref 0.0–3.0)
Basophils Absolute: 0 10*3/uL (ref 0.0–0.1)
EOS PCT: 3.1 % (ref 0.0–5.0)
Eosinophils Absolute: 0.2 10*3/uL (ref 0.0–0.7)
HCT: 37 % (ref 36.0–46.0)
Hemoglobin: 12.6 g/dL (ref 12.0–15.0)
LYMPHS ABS: 0.2 10*3/uL — AB (ref 0.7–4.0)
Lymphocytes Relative: 4.6 % — ABNORMAL LOW (ref 12.0–46.0)
MCHC: 34.1 g/dL (ref 30.0–36.0)
MCV: 92.6 fl (ref 78.0–100.0)
MONO ABS: 0.3 10*3/uL (ref 0.1–1.0)
Monocytes Relative: 5.9 % (ref 3.0–12.0)
NEUTROS PCT: 86.2 % — AB (ref 43.0–77.0)
Neutro Abs: 4.2 10*3/uL (ref 1.4–7.7)
PLATELETS: 239 10*3/uL (ref 150.0–400.0)
RBC: 3.99 Mil/uL (ref 3.87–5.11)
RDW: 15.7 % — AB (ref 11.5–15.5)
WBC: 4.9 10*3/uL (ref 4.0–10.5)

## 2016-05-17 LAB — BASIC METABOLIC PANEL
BUN: 15 mg/dL (ref 6–23)
CHLORIDE: 98 meq/L (ref 96–112)
CO2: 27 mEq/L (ref 19–32)
CREATININE: 0.91 mg/dL (ref 0.40–1.20)
Calcium: 9 mg/dL (ref 8.4–10.5)
GFR: 62.22 mL/min (ref >60.00)
GLUCOSE: 98 mg/dL (ref 70–99)
POTASSIUM: 4.2 meq/L (ref 3.5–5.1)
Sodium: 134 mEq/L — ABNORMAL LOW (ref 135–145)

## 2016-05-17 LAB — D-DIMER, QUANTITATIVE: D-Dimer, Quant: 1.89 mcg/mL FEU — ABNORMAL HIGH (ref <0.50)

## 2016-05-17 NOTE — Telephone Encounter (Signed)
Received call from Grayson at [facility] regarding STAT D-Dimer: result elevated at 1.89. If questions, they can be contacted at [phone], option 2.

## 2016-05-17 NOTE — Telephone Encounter (Signed)
[facility] called to inform of missed OT visit. Informed that Pt had OV w/ PCP this afternoon.

## 2016-05-17 NOTE — Patient Instructions (Signed)
GO TO THE LAB : Get the blood work     STOP BY THE FIRST FLOOR:  get the XR    If your results show possibly a clot, I will recommend you to either  go to the ER for further evaluation or get a CT of the chest.

## 2016-05-17 NOTE — Progress Notes (Signed)
Pre visit review using our clinic review tool, if applicable. No additional management support is needed unless otherwise documented below in the visit note. 

## 2016-05-17 NOTE — Progress Notes (Signed)
Subjective:    Patient ID: Mikayla Marks, female    DOB: Mar 15, 1928, 82 y.o.   MRN: [id number]  DOS:  05/17/2016 Type of visit - description : Acute visit, Here with her daughter. Interval history: Yesterday  at the physical therapies, she was feeling poorly: Fatigue, achy all over, her heart rate was 115 and O2 sat 93.  I advised her to come back here. Today he feels about the same, mostly fatigue. Review her  vital signs: 04/20/2016: Heart rate 51, O2 sat 97 05/03/2016  : Heart rate 92, O2 sat 97 05/11/2016: Heart rate 118, O2 sat 97   Review of Systems Some subjective fever. Denies chest pain, some short of breath & DOE, not consistently Mild cough in the mornings Pain ats left leg pain for few days, she points the area just above the knee .   Past Medical History:  Diagnosis Date  . Carotid artery stenosis   . Chronic kidney disease   . Hyperlipidemia   . HYPERTENSION   . Osteoporosis   . Paranoid schizophrenia ([place])   . SCC (squamous cell carcinoma)    in situ R pretibial area  . Schizoaffective disorder [facility])     Past Surgical History:  Procedure Laterality Date  . BREAST BIOPSY     benign tumor  . CATARACT EXTRACTION    . CHOLECYSTECTOMY    . IR GENERIC HISTORICAL  04/04/2016   IR FLUORO GUIDED NEEDLE PLC ASPIRATION/INJECTION LOC 04/04/2016 Luanne Bras, MD [facility]-INTERV RAD  . LASER ABLATION Left    left LE   . LEG SURGERY Right 10/2012   Ruptured Veins  . PARTIAL HYSTERECTOMY     due to bleeding  . TONSILLECTOMY      Social History   Social History  . Marital status: Single    Spouse name: N/A  . Number of children: 3  . Years of education: N/A   Occupational History  . not working    Social History Main Topics  . Smoking status: Never Smoker  . Smokeless tobacco: Never Used  . Alcohol use No  . Drug use: No  . Sexual activity: No   Other Topics Concern  . Not on file   Social History Narrative   Daughter lives with her [name])  ;  widow   Brother [name] in [place], lives 5 min away [phone]              Medication List       Accurate as of 05/17/16  1:25 PM. Always use your most recent med list.          CALTRATE 600 PO Take 1 tablet by mouth daily.   cycloSPORINE 0.05 % ophthalmic emulsion Commonly known as:  RESTASIS Place 1 drop into both eyes 2 (two) times daily.   docusate sodium 100 MG capsule Commonly known as:  COLACE Take 1 capsule (100 mg total) by mouth 2 (two) times daily.   gabapentin 100 MG capsule Commonly known as:  NEURONTIN Take 1 capsule (100 mg total) by mouth 3 (three) times daily.   methocarbamol 500 MG tablet Commonly known as:  ROBAXIN Take 1 tablet (500 mg total) by mouth every 8 (eight) hours as needed for muscle spasms.   multivitamin with minerals tablet Take 1 tablet by mouth 4 (four) times a week.   oxyCODONE 5 MG immediate release tablet Commonly known as:  Oxy IR/ROXICODONE Take 1 tablet (5 mg total) by mouth 3 (three) times daily  as needed for moderate pain.   SALONPAS EX Apply 1 patch topically as needed (for pain).   senna 8.6 MG Tabs tablet Commonly known as:  SENOKOT Take 1 tablet (8.6 mg total) by mouth 2 (two) times daily.          Objective:   Physical Exam BP 137/68 (BP Location: Right Arm, Patient Position: Sitting, Cuff Size: Normal)   Pulse (!) 105   Temp 97.7 F (36.5 C) (Oral)   Resp 18   Ht 5' (1.524 m)   Wt 143 lb 3.2 oz (65 kg)   SpO2 96% Comment: RA  BMI 27.97 kg/m  General:   Well developed, well nourished . NAD.  HEENT:  Normocephalic . Face symmetric, atraumatic Lungs:  CTA B Normal respiratory effort, no intercostal retractions, no accessory muscle use. When she transferred from the chair to the table she did get some short of breath. Heart: Tachycardic,  no murmur.  Trace pretibial edema bilaterally , calves symmetric, nontender  Skin: Not pale. Not jaundice Neurologic:  alert & oriented X3.  Speech normal,  gait appropriate for age and unassisted Psych--  Cognition and judgment appear intact.  Cooperative with normal attention span and concentration.  Behavior appropriate. No anxious or depressed appearing.    Assessment & Plan:  Assessment HTN Hyperlipidemia Schizophrenia Osteoporosis : Per DEXA 2010, DEXA 2013 with Dr. Amalia Hailey: Osteopenia. 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 Vitamin D deficiency Varicose pain, lower extremity edema: [place] (-) DVT 08-2014 SCC, R pretibial  PLAN  Tachycardia:  82 year old lady with a malignancy,presents w/ tachycardia, fatigue for the last few days. Last hemoglobin was okay, last TSH 10/2015 normal. Back in 2016, d-dimer was positive and f/u  CT was negative for PE. EKG shows sinus tachycardia without acute changes.  I am moderately  concerned about her sx and shared that with them, could be something , PE? We discussed ER versus outpatient workup, she really does not like to go to the ER, we had a extensive discussion about the issue  . Plan: Chest x-ray, stat CBC-BMP-d-dimer. If d-dimer positive will proceed with a CT. Recommend ER if severe symptoms  addendum: D-dimer positive, CBC and BMP otherwise negative. Chest x-ray no acute findings. Recommend CT chest. Will try to get tonight otherwise first thing in the morning. D/w pt and daughter

## 2016-05-17 NOTE — Telephone Encounter (Signed)
See visit from today, having a CT

## 2016-05-18 ENCOUNTER — Ambulatory Visit: Payer: Self-pay | Admitting: Radiation Oncology

## 2016-05-18 ENCOUNTER — Telehealth: Payer: Self-pay

## 2016-05-18 NOTE — Assessment & Plan Note (Signed)
Tachycardia:  82 year old lady with a malignancy,presents w/ tachycardia, fatigue for the last few days. Last hemoglobin was okay, last TSH 10/2015 normal. Back in 2016, d-dimer was positive and f/u  CT was negative for PE. EKG shows sinus tachycardia without acute changes.  I am moderately  concerned about her sx and shared that with them, could be something , PE? We discussed ER versus outpatient workup, she really does not like to go to the ER, we had a extensive discussion about the issue  . Plan: Chest x-ray, stat CBC-BMP-d-dimer. If d-dimer positive will proceed with a CT. Recommend ER if severe symptoms  addendum: D-dimer positive, CBC and BMP otherwise negative. Chest x-ray no acute findings. Recommend CT chest. Will try to get tonight otherwise first thing in the morning. D/w pt and daughter

## 2016-05-18 NOTE — Telephone Encounter (Signed)
Informed Hoyle Sauer in radiology called to schedule Pt for CT angio Chest w/ and w/o STAT. Pt and her daughter refused to come as Pt is not feeling well. Will inform PCP.

## 2016-05-18 NOTE — Telephone Encounter (Signed)
My concerns of these being a clot, a serious condition where shared  with the patient and daughter yesterday.

## 2016-05-24 ENCOUNTER — Telehealth: Payer: Self-pay | Admitting: Internal Medicine

## 2016-05-24 DIAGNOSIS — N189 Chronic kidney disease, unspecified: Secondary | ICD-10-CM | POA: Diagnosis not present

## 2016-05-24 DIAGNOSIS — I2510 Atherosclerotic heart disease of native coronary artery without angina pectoris: Secondary | ICD-10-CM | POA: Diagnosis not present

## 2016-05-24 DIAGNOSIS — M8458XD Pathological fracture in neoplastic disease, other specified site, subsequent encounter for fracture with routine healing: Secondary | ICD-10-CM | POA: Diagnosis not present

## 2016-05-24 DIAGNOSIS — I129 Hypertensive chronic kidney disease with stage 1 through stage 4 chronic kidney disease, or unspecified chronic kidney disease: Secondary | ICD-10-CM | POA: Diagnosis not present

## 2016-05-24 DIAGNOSIS — F200 Paranoid schizophrenia: Secondary | ICD-10-CM | POA: Diagnosis not present

## 2016-05-24 DIAGNOSIS — C9030 Solitary plasmacytoma not having achieved remission: Secondary | ICD-10-CM | POA: Diagnosis not present

## 2016-05-24 MED ORDER — METHOCARBAMOL 500 MG PO TABS: 500.0000 mg | ORAL_TABLET | Freq: Three times a day (TID) | ORAL | 0 refills | Status: DC | PRN

## 2016-05-24 MED ORDER — OXYCODONE HCL 5 MG PO TABS: 5.0000 mg | ORAL_TABLET | Freq: Three times a day (TID) | ORAL | 0 refills | Status: DC | PRN

## 2016-05-24 NOTE — Telephone Encounter (Signed)
Caller name:Megan Relationship to patient:[facility] Can be reached:[phone] Pharmacy:  Reason for call: Wanted to let MD know what patient temp was 100.2, patient is feeling tired and having head congestion. Requesting call back

## 2016-05-24 NOTE — Telephone Encounter (Signed)
[place] of PCP recommendations. Instructed to call if questions/concerns.

## 2016-05-24 NOTE — Telephone Encounter (Signed)
LMOM informing Mikayla Marks that Rx's will be placed at front desk for pick up once PCP signs them. Instructed that Pt and/or her family will need to come by office to pick up hard copies.

## 2016-05-24 NOTE — Telephone Encounter (Signed)
The patient is 72 and has a malignancy, unable to evaluate fever via phone conversation. Recommend OTCs and a face-to-face evaluation either here or at the ER or urgent care if severe symptoms or no better in the next 24 hours.

## 2016-05-24 NOTE — Telephone Encounter (Signed)
Please advise 

## 2016-05-24 NOTE — Telephone Encounter (Signed)
Rx for Oxycodone and Robaxin printed, awaiting MD signature.

## 2016-05-24 NOTE — Telephone Encounter (Signed)
Caller name: Jinny Blossom with [facility] Can be reached: [phone]  Reason for call: Jinny Blossom states pt needing refill on oxycodone. Has 6 left. Takes 3/day. Call family to p/u RX [phone].  Pt also need robaxin. She has 2 days left on it. Please send Robaxin to [facility] on [address].

## 2016-05-24 NOTE — Telephone Encounter (Signed)
Okay #45,no rf

## 2016-05-25 ENCOUNTER — Telehealth: Payer: Self-pay | Admitting: Internal Medicine

## 2016-05-25 DIAGNOSIS — M8458XD Pathological fracture in neoplastic disease, other specified site, subsequent encounter for fracture with routine healing: Secondary | ICD-10-CM | POA: Diagnosis not present

## 2016-05-25 DIAGNOSIS — I129 Hypertensive chronic kidney disease with stage 1 through stage 4 chronic kidney disease, or unspecified chronic kidney disease: Secondary | ICD-10-CM | POA: Diagnosis not present

## 2016-05-25 DIAGNOSIS — I2510 Atherosclerotic heart disease of native coronary artery without angina pectoris: Secondary | ICD-10-CM | POA: Diagnosis not present

## 2016-05-25 DIAGNOSIS — C9030 Solitary plasmacytoma not having achieved remission: Secondary | ICD-10-CM | POA: Diagnosis not present

## 2016-05-25 DIAGNOSIS — F200 Paranoid schizophrenia: Secondary | ICD-10-CM | POA: Diagnosis not present

## 2016-05-25 DIAGNOSIS — N189 Chronic kidney disease, unspecified: Secondary | ICD-10-CM | POA: Diagnosis not present

## 2016-05-25 NOTE — Telephone Encounter (Signed)
Caller Name: [facility] rmrn # [id number] Relation to pt: OT  Call back number: [phone]    Reason for call:  Elevated HR and diffculty breathing in need of clinical advice as per OT patient states she feels fine please advise

## 2016-05-25 NOTE — Telephone Encounter (Signed)
Called Richard, OT to follow up.  He stated he had concerns about patients HR and breathing during his visit today. Upon arrival her HR: 102, on exertion 120.  Once patient rested, HR came back down to 112.  Richard also noted that with exertion O2 Sat was 82%.  After several minutes of resting, O2 Sat 91%.  Informed Richard, that a nurse would call patient to follow up with concerns.    Called to follow up with patient. Spoke with both patient and daughter.  Patient states that she has been feeling short of breath lately.  Stated having phelgm in throat that she's unable to cough up.  Daughter states patient has not been coughing.  Daughter says she gave patient Dayquil yesterday and the day before.  Daughter and patient were advised that patient should be seen today for evaluation.  Daughter asked if patient's medication could be contributing to patient symptoms.  She also asked for the results of patient's chest x-ray.  States she was under the impression that patient's chest xray came back clear.  Reviewed patient's chart.  Chest x-ray reviewed with patient and daughter.  Per note on 05/18/16, Dr. Larose Kells had concerns about possible blood clot and that daughter and patient were made aware. Daughter states she was not aware.  She requested a copy of patient's imaging report, labs, and most recent EKGs.  Daughter states she does not want patient to be seen until she has a copy of patient's reports.    Spoke to Dr. Larose Kells and made him aware of the above.  He states he saw patient in office recently for same concerns (05/17/16). For comfort, he advised oxygen therapy for patient.  He supported daughter's request for records.    Called Richard to make him aware that Dr. Larose Kells is aware of his concerns and recommends oxygen for comfort and that we will call make patient and daughter aware.    Called daughter and patient back with Dr. Ethel Rana recommendations.  Daughter stated she did not want anything done until they are  able to review the reports.  Reports printed and placed up front for pick up.    Message routed to Dr. Larose Kells for [facility].

## 2016-05-26 NOTE — Telephone Encounter (Signed)
Noted, thx.

## 2016-06-02 ENCOUNTER — Ambulatory Visit: Payer: Self-pay | Admitting: Radiation Oncology

## 2016-06-03 DIAGNOSIS — I2510 Atherosclerotic heart disease of native coronary artery without angina pectoris: Secondary | ICD-10-CM | POA: Diagnosis not present

## 2016-06-03 DIAGNOSIS — N189 Chronic kidney disease, unspecified: Secondary | ICD-10-CM | POA: Diagnosis not present

## 2016-06-03 DIAGNOSIS — C9030 Solitary plasmacytoma not having achieved remission: Secondary | ICD-10-CM | POA: Diagnosis not present

## 2016-06-03 DIAGNOSIS — F200 Paranoid schizophrenia: Secondary | ICD-10-CM | POA: Diagnosis not present

## 2016-06-03 DIAGNOSIS — I129 Hypertensive chronic kidney disease with stage 1 through stage 4 chronic kidney disease, or unspecified chronic kidney disease: Secondary | ICD-10-CM | POA: Diagnosis not present

## 2016-06-03 DIAGNOSIS — M8458XD Pathological fracture in neoplastic disease, other specified site, subsequent encounter for fracture with routine healing: Secondary | ICD-10-CM | POA: Diagnosis not present

## 2016-06-07 NOTE — Progress Notes (Signed)
Mrs. Ciniya Lardie. Christiansen 82 year old female here for one month follow up appointment for thoracic spine plasmacytoma causing cord compression.  Pain:7/10 to Back and left rib taking Oxycodone and Neurontin for pain control Swallowing problems/ pain/swallowing difficulty:Having throat tightness at times like cold symptoms Skin:Has very dry skin with some mild redness Ambulation:Using walker at home to ambulate. Wt Readings from Last 3 Encounters:  06/14/16 137 lb 12.8 oz (62.5 kg)  05/17/16 143 lb 3.2 oz (65 kg)  05/11/16 142 lb 2 oz (64.5 kg)  BP (!) 149/70   Pulse 83   Temp 97.7 F (36.5 C) (Oral)   Resp 16   Ht (!) 5" (0.127 m)   Wt 137 lb 12.8 oz (62.5 kg)   SpO2 100%   BMI 3875.36 kg/m

## 2016-06-09 ENCOUNTER — Telehealth: Payer: Self-pay | Admitting: Internal Medicine

## 2016-06-09 NOTE — Telephone Encounter (Signed)
FYI

## 2016-06-09 NOTE — Telephone Encounter (Signed)
Noted  

## 2016-06-09 NOTE — Telephone Encounter (Signed)
Megan with [facility] called stating that patient will not answer or return their calls, when her or her daughter answer they hang up with out speaking. They are going to discharge the patient. Please advise  Jinny Blossom phone: [phone]

## 2016-06-14 ENCOUNTER — Inpatient Hospital Stay: Admission: RE | Admit: 2016-06-14 | Payer: Self-pay | Source: Ambulatory Visit | Admitting: Radiation Oncology

## 2016-06-14 ENCOUNTER — Ambulatory Visit
Admission: RE | Admit: 2016-06-14 | Discharge: 2016-06-14 | Disposition: A | Discharge status: Home / Self Care | Payer: Medicare Other | Source: Ambulatory Visit | Attending: Radiation Oncology | Admitting: Radiation Oncology

## 2016-06-14 ENCOUNTER — Encounter: Payer: Self-pay | Admitting: Radiation Oncology

## 2016-06-14 VITALS — BP 149/70 | HR 83 | Temp 97.7°F | Resp 16 | Ht <= 58 in | Wt 137.8 lb

## 2016-06-14 DIAGNOSIS — F209 Schizophrenia, unspecified: Secondary | ICD-10-CM | POA: Diagnosis not present

## 2016-06-14 DIAGNOSIS — G9520 Unspecified cord compression: Secondary | ICD-10-CM | POA: Diagnosis not present

## 2016-06-14 DIAGNOSIS — Z880 Allergy status to penicillin: Secondary | ICD-10-CM | POA: Diagnosis not present

## 2016-06-14 DIAGNOSIS — C9030 Solitary plasmacytoma not having achieved remission: Secondary | ICD-10-CM | POA: Diagnosis not present

## 2016-06-14 DIAGNOSIS — M40209 Unspecified kyphosis, site unspecified: Secondary | ICD-10-CM | POA: Diagnosis not present

## 2016-06-14 MED ORDER — METHOCARBAMOL 500 MG PO TABS: 500.0000 mg | ORAL_TABLET | Freq: Three times a day (TID) | ORAL | 0 refills | Status: DC | PRN

## 2016-06-14 MED ORDER — OXYCODONE HCL 5 MG PO TABS: 5.0000 mg | ORAL_TABLET | Freq: Three times a day (TID) | ORAL | 0 refills | Status: DC | PRN

## 2016-06-14 MED ORDER — GABAPENTIN 100 MG PO CAPS: 100.0000 mg | ORAL_CAPSULE | Freq: Three times a day (TID) | ORAL | 3 refills | Status: DC

## 2016-06-14 NOTE — Progress Notes (Signed)
Radiation Oncology         [phone] ________________________________  Name: Mikayla Marks MRN: [id number]  Date: 06/14/2016  DOB: [birth year]  Post Treatment Note  CC: Kathlene November, MD  Colon Branch, MD  Diagnosis:   82 yo woman with thoracic spine plasmacytoma causing cord compression  Interval Since Last Radiation:  6 weeks   04/06/2016 to 05/04/2016: 1. Thoracic spine Plasmacytoma 1 was treated to 20 Gy in 10 fractions at 2 Gy per fraction 2. Thoracic spine Plasmacytoma 2 was treated to 20 Gy in 10 fractions at 2 Gy per fraction   Narrative:  The patient returns today for routine follow-up. The patient did well and tolerating her radiotherapy.                            On review of systems, the patient states since her radiation was completed, she has noticed improvement in her back pain. She does still have experience with pain in the thoracic region that radiates around her left side, she states that this has been helped with oxycodone, Robaxin, and gabapentin. She requests a new prescription of each drug of this regimen. She's also interested in meeting with somebody with a spine specialist tonight help with some of her kyphotic changes of the spine.she denies chest pain or shortness of breath. She has had some postnasal drip, and occasional nausea after this experience. No other complaints or verbalized.  ALLERGIES:  is allergic to other; bacitracin-polymyxin b; and penicillins.  Meds: Current Outpatient Prescriptions  Medication Sig Dispense Refill  . Calcium Carbonate (CALTRATE 600 PO) Take 1 tablet by mouth daily.     . cycloSPORINE (RESTASIS) 0.05 % ophthalmic emulsion Place 1 drop into both eyes 2 (two) times daily.    Marland Kitchen docusate sodium (COLACE) 100 MG capsule Take 1 capsule (100 mg total) by mouth 2 (two) times daily. (Patient taking differently: Take 100 mg by mouth 2 (two) times daily as needed. ) 10 capsule 0  . gabapentin (NEURONTIN) 100 MG capsule Take 1 capsule  (100 mg total) by mouth 3 (three) times daily. 90 capsule 3  . methocarbamol (ROBAXIN) 500 MG tablet Take 1 tablet (500 mg total) by mouth every 8 (eight) hours as needed for muscle spasms. 45 tablet 0  . Multiple Vitamins-Minerals (MULTIVITAMIN WITH MINERALS) tablet Take 1 tablet by mouth 4 (four) times a week.     Marland Kitchen oxyCODONE (OXY IR/ROXICODONE) 5 MG immediate release tablet Take 1 tablet (5 mg total) by mouth 3 (three) times daily as needed for moderate pain. 45 tablet 0  . senna (SENOKOT) 8.6 MG TABS tablet Take 1 tablet (8.6 mg total) by mouth 2 (two) times daily. 120 each 0  . Liniments (SALONPAS EX) Apply 1 patch topically as needed (for pain).     No current facility-administered medications for this encounter.     Physical Findings:  height is 5" (0.127 m) (abnormal) and weight is 137 lb 12.8 oz (62.5 kg). Her oral temperature is 97.7 F (36.5 C). Her blood pressure is 149/70 (abnormal) and her pulse is 83. Her respiration is 16 and oxygen saturation is 100%.  In general this is a well appearing Caucasian female in no acute distress. She's alert and oriented x4 and appropriate throughout the examination. Cardiopulmonary assessment is negative for acute distress and she exhibits normal effort.   Lab Findings: Lab Results  Component Value Date   WBC 4.9 05/17/2016  HGB 12.6 05/17/2016   HCT 37.0 05/17/2016   MCV 92.6 05/17/2016   PLT 239.0 05/17/2016     Radiographic Findings: Dg Chest 2 View  Result Date: 05/17/2016 CLINICAL DATA:  Shortness of breath with dyspnea on exertion. Recent diagnosis of plasmacytoma T8-T9 vertebral bodies. EXAM: CHEST  2 VIEW COMPARISON:  CT chest abdomen and pelvis 04/02/2016 FINDINGS: Mild cardiomegaly. The patient is markedly kyphotic, and due to decreased bony mineralization, individual thoracic spine vertebral bodies are difficult to visualize in the lateral projection. The patient's kyphosis is centered at the abnormal vertebral bodies described  on recent CT chest and abdomen pelvis, at the T8-T9 level, where soft tissue masses were seen. On the chest radiograph, there is a lesion with partial destruction and sclerosis associated with the posterior left seventh rib. On my review the recent CT chest abdomen pelvis, there is a soft tissue mass with bony destruction involving the left posterior seventh rib, on the CT dated 04/02/2016. There is left basilar atelectasis and a small left pleural effusion. Right lung appears clear. There is artifact from the patient's bra, projecting over the upper chest. No visible pneumothorax. IMPRESSION: Small left pleural effusion and left basilar atelectasis. Marked kyphosis, centered at the T8-T9 level with the patient has known soft tissue masses as described on chest abdomen pelvis CT dated 04/02/2016. Left posterior seventh rib lesion with rib destruction corresponds to a soft tissue mass associated with the left rib seen in retrospect on the recent CT chest abdomen pelvis. Electronically Signed   By: Curlene Dolphin M.D.   On: 05/17/2016 14:50    Impression/Plan: 1. 82 yo woman with thoracic spine plasmacytoma causing cord compression.the patient appears to be doing significantly better since completion of radiotherapy. We will follow her moving forward as needed as Dr. Burr Medico will be continuing to follow along with her as well. Regarding her back pain, I have given the patient a one time prescription of Robaxin, oxycodone, and gabapentin. She will discuss continuation of this medication further with her primary provider and oncologist. 2. Kyphosis. The patient was given the name of the neurosurgery practice. [facility] to discuss whether or not there might be a procedure or less invasive way of managing her lack of mobility. 3. Schizophrenia.the patient's mood appears to be stable. Again she remains off any intent psychotic medications at the recommendation of her son who is a physician, her brother healthcare POA,  and her primary provider.     Carola Rhine, PAC

## 2016-06-15 NOTE — Addendum Note (Signed)
Encounter addended by: Malena Edman, RN on: 06/15/2016  8:17 AM<BR>    Actions taken: Charge Capture section accepted

## 2016-06-28 ENCOUNTER — Telehealth: Payer: Self-pay

## 2016-06-28 NOTE — Telephone Encounter (Signed)
Pt discharged from [place]: Pt and/or caregiver refusing all visits and not returning calls.

## 2016-07-04 ENCOUNTER — Telehealth: Payer: Self-pay

## 2016-07-04 DIAGNOSIS — C9030 Solitary plasmacytoma not having achieved remission: Secondary | ICD-10-CM

## 2016-07-04 NOTE — Telephone Encounter (Signed)
Billing sheet attached forms and forwarded to PCP for review and signature.

## 2016-07-04 NOTE — Telephone Encounter (Signed)
Form reviewed, signed and faxed to [facility] at [phone]. Form sent for scanning.

## 2016-07-05 ENCOUNTER — Other Ambulatory Visit (HOSPITAL_BASED_OUTPATIENT_CLINIC_OR_DEPARTMENT_OTHER): Payer: Medicare Other

## 2016-07-05 ENCOUNTER — Ambulatory Visit (HOSPITAL_BASED_OUTPATIENT_CLINIC_OR_DEPARTMENT_OTHER): Payer: Medicare Other | Admitting: Hematology

## 2016-07-05 ENCOUNTER — Telehealth: Payer: Self-pay | Admitting: Hematology

## 2016-07-05 ENCOUNTER — Encounter: Payer: Self-pay | Admitting: Hematology

## 2016-07-05 VITALS — BP 150/62 | HR 90 | Temp 97.8°F | Resp 17 | Wt 138.7 lb

## 2016-07-05 DIAGNOSIS — C9030 Solitary plasmacytoma not having achieved remission: Secondary | ICD-10-CM

## 2016-07-05 DIAGNOSIS — G893 Neoplasm related pain (acute) (chronic): Secondary | ICD-10-CM

## 2016-07-05 DIAGNOSIS — F200 Paranoid schizophrenia: Secondary | ICD-10-CM

## 2016-07-05 DIAGNOSIS — M549 Dorsalgia, unspecified: Secondary | ICD-10-CM | POA: Diagnosis not present

## 2016-07-05 LAB — COMPREHENSIVE METABOLIC PANEL
ALBUMIN: 3.5 g/dL (ref 3.5–5.0)
ALK PHOS: 69 U/L (ref 40–150)
ALT: 9 U/L (ref 0–55)
ANION GAP: 8 meq/L (ref 3–11)
AST: 17 U/L (ref 5–34)
BUN: 16.8 mg/dL (ref 7.0–26.0)
CALCIUM: 9.6 mg/dL (ref 8.4–10.4)
CO2: 27 mEq/L (ref 22–29)
Chloride: 106 mEq/L (ref 98–109)
Creatinine: 0.9 mg/dL (ref 0.6–1.1)
EGFR: 58 mL/min/{1.73_m2} — AB (ref >90)
Glucose: 118 mg/dl (ref 70–140)
POTASSIUM: 4.4 meq/L (ref 3.5–5.1)
Sodium: 141 mEq/L (ref 136–145)
Total Bilirubin: 0.45 mg/dL (ref 0.20–1.20)
Total Protein: 6.5 g/dL (ref 6.4–8.3)

## 2016-07-05 LAB — CBC WITH DIFFERENTIAL/PLATELET
BASO%: 0.4 % (ref 0.0–2.0)
BASOS ABS: 0 10*3/uL (ref 0.0–0.1)
EOS ABS: 0.3 10*3/uL (ref 0.0–0.5)
EOS%: 6.6 % (ref 0.0–7.0)
HEMATOCRIT: 38.5 % (ref 34.8–46.6)
HEMOGLOBIN: 12.3 g/dL (ref 11.6–15.9)
LYMPH#: 0.6 10*3/uL — AB (ref 0.9–3.3)
LYMPH%: 11.2 % — ABNORMAL LOW (ref 14.0–49.7)
MCH: 30.6 pg (ref 25.1–34.0)
MCHC: 31.9 g/dL (ref 31.5–36.0)
MCV: 95.8 fL (ref 79.5–101.0)
MONO#: 0.5 10*3/uL (ref 0.1–0.9)
MONO%: 9 % (ref 0.0–14.0)
NEUT#: 3.7 10*3/uL (ref 1.5–6.5)
NEUT%: 72.8 % (ref 38.4–76.8)
Platelets: 290 10*3/uL (ref 145–400)
RBC: 4.02 10*6/uL (ref 3.70–5.45)
RDW: 14.9 % — AB (ref 11.2–14.5)
WBC: 5 10*3/uL (ref 3.9–10.3)

## 2016-07-05 MED ORDER — OXYCODONE HCL 5 MG PO TABS: 5.0000 mg | ORAL_TABLET | Freq: Three times a day (TID) | ORAL | 0 refills | Status: DC | PRN

## 2016-07-05 NOTE — Progress Notes (Signed)
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 There were additional smaller lesions in the T11 pedicles bilaterally.      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 The mass causes severe central stenosis and significant narrowing of the neural foramina.      04/04/2016 Initial Biopsy    CT biopsy of T9 on 04/04/2016 showed plasma cell neoplasm.      04/06/2016 - 05/04/2016 Radiation Therapy    1. Thoracic spine Plasmacytoma 1 was treated to 20 Gy in 10 fractions at 2 Gy per fraction. 2. Thoracic spine Plasmacytoma 2 was treated to 20 Gy in 10 fractions at 2 Gy per fraction for a total of 40 Gy      04/11/2016 Bone Marrow Biopsy    Bone marrow biopsy 04/11/2016 showed a hypercellular bone marrow with plasma cell neoplasm. The plasma cells represent 13% of all cells with lack of large aggregates or sheets.       CURRENT THERAPY: radiation   INTERVAL HISTORY: Mikayla Marks returns for follow-up, she is accompanied by [name] today. She is Doing moderately well overall. She complains of back pain, which fluctuates, sometimes 7-8 out of 10, she takes oxycodone 2-3 tablets a day. No other new pain. She is quite  positive that she is not able to stand straight, and has moderate fatigue, not able to do since she used to. She otherwise feels well, has good appetite, weight is stable. No other new complaints   REVIEW OF SYSTEMS:   Constitutional: Denies fevers, chills or abnormal weight loss, (+) fatigue  Eyes: Denies blurriness of vision Ears, nose, mouth, throat, and face: Denies mucositis or sore throat Respiratory: Denies cough, dyspnea or wheezes Cardiovascular: Denies palpitation, chest discomfort or lower extremity swelling Gastrointestinal:  Denies nausea, heartburn or change in bowel habits Skin: Denies abnormal skin rashes Lymphatics: Denies new lymphadenopathy or easy bruising Neurological:Denies numbness, tingling or new weaknesses, (+) mild to moderate pain in the thoracic spine Behavioral/Psych: Mood is stable, no new changes  All other systems were reviewed with the patient and are negative.  MEDICAL HISTORY:  Past Medical History:  Diagnosis Date  . Carotid artery stenosis   . Chronic Marks disease   . Hyperlipidemia   . HYPERTENSION   . Osteoporosis   . Paranoid schizophrenia ([place])   . SCC (squamous cell carcinoma)    in situ R pretibial area  . Schizoaffective disorder ([place])     SURGICAL HISTORY: Past Surgical History:  Procedure Laterality Date  . BREAST BIOPSY     benign tumor  . CATARACT EXTRACTION    . CHOLECYSTECTOMY    . IR GENERIC HISTORICAL  04/04/2016   IR FLUORO GUIDED NEEDLE PLC ASPIRATION/INJECTION LOC 04/04/2016 Luanne Bras, MD [facility]-INTERV RAD  .  LASER ABLATION Left    left LE   . LEG SURGERY Right 10/2012   Ruptured Veins  . PARTIAL HYSTERECTOMY     due to bleeding  . TONSILLECTOMY      I have reviewed the social history and family history with the patient and they are unchanged from previous note.  ALLERGIES:  is allergic to other; bacitracin-polymyxin b; and penicillins.  MEDICATIONS:  Current Outpatient Prescriptions  Medication Sig  Dispense Refill  . Calcium Carbonate (CALTRATE 600 PO) Take 1 tablet by mouth daily.     . cycloSPORINE (RESTASIS) 0.05 % ophthalmic emulsion Place 1 drop into both eyes 2 (two) times daily.    Marland Kitchen docusate sodium (COLACE) 100 MG capsule Take 1 capsule (100 mg total) by mouth 2 (two) times daily. (Patient taking differently: Take 100 mg by mouth 2 (two) times daily as needed. ) 10 capsule 0  . gabapentin (NEURONTIN) 100 MG capsule Take 1 capsule (100 mg total) by mouth 3 (three) times daily. 90 capsule 3  . Liniments (SALONPAS EX) Apply 1 patch topically as needed (for pain).    . methocarbamol (ROBAXIN) 500 MG tablet Take 1 tablet (500 mg total) by mouth every 8 (eight) hours as needed for muscle spasms. 45 tablet 0  . Multiple Vitamins-Minerals (MULTIVITAMIN WITH MINERALS) tablet Take 1 tablet by mouth 4 (four) times a week.     Marland Kitchen oxyCODONE (OXY IR/ROXICODONE) 5 MG immediate release tablet Take 1 tablet (5 mg total) by mouth 3 (three) times daily as needed for moderate pain. 70 tablet 0  . senna (SENOKOT) 8.6 MG TABS tablet Take 1 tablet (8.6 mg total) by mouth 2 (two) times daily. (Patient taking differently: Take 1 tablet by mouth as needed. ) 120 each 0   No current facility-administered medications for this visit.     PHYSICAL EXAMINATION: ECOG PERFORMANCE STATUS: 3 - Symptomatic, >50% confined to bed  Vitals:   07/05/16 1307  BP: (!) 150/62  Pulse: 90  Resp: 17  Temp: 97.8 F (36.6 C)   Filed Weights   07/05/16 1307  Weight: 138 lb 11.2 oz (62.9 kg)    GENERAL:alert, no distress and comfortable SKIN: skin color, texture, turgor are normal, no rashes or significant lesions EYES: normal, Conjunctiva are pink and non-injected, sclera clear OROPHARYNX:no exudate, no erythema and lips, buccal mucosa, and tongue normal  NECK: supple, thyroid normal size, non-tender, without nodularity LYMPH:  no palpable lymphadenopathy in the cervical, axillary or inguinal LUNGS: clear to  auscultation and percussion with normal breathing effort HEART: regular rate & rhythm and no murmurs and no lower extremity edema ABDOMEN:abdomen soft, non-tender and normal bowel sounds Musculoskeletal:no cyanosis of digits and no clubbing  NEURO: alert & oriented x 3 with fluent speech, no focal motor/sensory deficits  LABORATORY DATA:  I have reviewed the data as listed CBC Latest Ref Rng & Units 07/05/2016 05/17/2016 04/18/2016  WBC 3.9 - 10.3 10e3/uL 5.0 4.9 8.5  Hemoglobin 11.6 - 15.9 g/dL 12.3 12.6 12.4  Hematocrit 34.8 - 46.6 % 38.5 37.0 37  Platelets 145 - 400 10e3/uL 290 239.0 381     CMP Latest Ref Rng & Units 07/05/2016 05/17/2016 04/18/2016  Glucose 70 - 140 mg/dl 118 98 -  BUN 7.0 - 26.0 mg/dL 16.8 15 24(A)  Creatinine 0.6 - 1.1 mg/dL 0.9 0.91 0.9  Sodium 136 - 145 mEq/L 141 134(L) 139  Potassium 3.5 - 5.1 mEq/L 4.4 4.2 4.0  Chloride 96 - 112 mEq/L - 98 -  CO2 22 - 29 mEq/L 27 27 -  Calcium 8.4 - 10.4 mg/dL 9.6 9.0 -  Total Protein 6.4 - 8.3 g/dL 6.5 - -  Total Bilirubin 0.20 - 1.20 mg/dL 0.45 - -  Alkaline Phos 40 - 150 U/L 69 - -  AST 5 - 34 U/L 17 - -  ALT 0 - 55 U/L 9 - -   PATHOLOGY REPORT  Diagnosis 04/11/2016 Bone Marrow, Aspirate,Biopsy, and Clot, right iliac - HYPERCELLULAR BONE MARROW WITH PLASMA CELL NEOPLASM - TRILINEAGE HEMATOPOIESIS. - SEE COMMENT. PERIPHERAL BLOOD: - OCCASIONAL HYPERSEGMENTED NEUTROPHILS. Diagnosis Note The bone marrow is hypercellular for age with increased number of plasma cells representing 13% of all cells with lack of large aggregates or sheets. Immunohistochemical stains show that the plasma cells are kappa light chain restricted consistent with plasma cell neoplasm. The background shows trilineage hematopoiesis and essentially orderly and progressive maturation of all myeloid cell types. Occasional hypersegmented neutrophils or atypical megakaryocytes are seen. The latter changes are considered minimal and nonspecific and  may be related to medication, nutritional deficiency, etc, but correlation with cytogenetic studies is recommended. (BNS:ecj 07-May-2016)  Cytogenetics: normal   RADIOGRAPHIC STUDIES: I have personally reviewed the radiological images as listed and agreed with the findings in the report. No results found.   ASSESSMENT & PLAN:  83 y.o. with a past medical history significant for paranoid schizophrenia and HTNwho presents with worseningback pain for one year, much worse for the past 6 months.   1. 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 Deedra Ehrich also  -Her bone marrow biopsy did show 13% plasma cells, however her SPEP with immunofixation is negative, she has elevated Kappy light chain in serum and urine  -She has biopsy proved plasmacytoma involving T8 and T9, no other bone lesions on MRI scan -She has received a definitive dose of radiation to her thoracic plasmacytoma, tolerated well -Giving her advanced age and medical comorbidities, especially her schizophrenia, I do not recommend any systemic therapy for her multiple myeloma for now, we'll continue observation -If she develops multiple myeloma-related new bone lesion or other organ damage, I would consider multiple myeloma treatment. -She is clinically doing moderately well, except for the mid back pain. Her lab CBC and CMP are within normal limits, SPEP and light chain levels are still pending from today. -We will plan to continue observation, consider repeat her spine MRI in about 6 months  -I will check with Dr. Tammi Klippel again to see if she is a candidate for kyphoplasty due to her mid back pain  2. Schizophrenia -on meds, stable   3. Hypertension -continue medication   4. Back pain secondary to #1 -s/p radiation, pain has improved, but patient still has intermittent moderate to severe  pain  --I will check with Dr. Tammi Klippel again to see if she is a candidate for kyphoplasty due to her mid back pain  Follow up -RTC in 2 month with lab   All questions were answered. The patient knows to call the clinic with any problems, questions or concerns. No barriers to learning was detected.  I spent 25 minutes counseling the patient face to face. The total time spent in the appointment was 30 minutes and more than 50% was on counseling and review of test results     Truitt Merle, MD 07/05/16

## 2016-07-05 NOTE — Telephone Encounter (Signed)
Appointments scheduled per 1/9 LOS. Patient given AVS report and calendars with future scheduled appointments. °

## 2016-07-06 ENCOUNTER — Other Ambulatory Visit: Payer: Self-pay | Admitting: Radiation Oncology

## 2016-07-06 ENCOUNTER — Telehealth: Payer: Self-pay | Admitting: Radiation Oncology

## 2016-07-06 DIAGNOSIS — C9030 Solitary plasmacytoma not having achieved remission: Secondary | ICD-10-CM

## 2016-07-06 LAB — KAPPA/LAMBDA LIGHT CHAINS
IG KAPPA FREE LIGHT CHAIN: 53.1 mg/L — AB (ref 3.3–19.4)
IG LAMBDA FREE LIGHT CHAIN: 12.9 mg/L (ref 5.7–26.3)
KAPPA/LAMBDA FLC RATIO: 4.12 — AB (ref 0.26–1.65)

## 2016-07-06 NOTE — Telephone Encounter (Signed)
Phoned patient as requested by Shona Simpson, PA-C. Inquired if patient is now interested in neurosurgeon consult. She reports she is. Patient's daughter took phone from patient while we were talking. Daughter reported there are only certain days she is available to take her mother to appointments and requested a call before any appointment is set.

## 2016-07-06 NOTE — Telephone Encounter (Signed)
-----   Message from Hayden Pedro, PA-C sent at 07/06/2016  9:37 AM EST ----- Sam, do you mind reaching out to the patient to see if she'd like the referral? Last time I saw her I offered a referral, but she wanted to consider this more, and wasn't quite ready at that time.  Thanks, Bryson Ha  ----- Message ----- From: Truitt Merle, MD Sent: 07/05/2016   5:40 PM To: Tyler Pita, MD, #  I saw her today, she still has moderate to severe mid back pain. She told me she was given a list of neurosurgeon when she saw Ebony Hail on 12/19 but has not called anyone yet. Do you want to refer her to neurosurgeon?   Thanks  Krista Blue

## 2016-07-07 LAB — MULTIPLE MYELOMA PANEL, SERUM
ALBUMIN/GLOB SERPL: 1.5 (ref 0.7–1.7)
ALPHA 1: 0.2 g/dL (ref 0.0–0.4)
Albumin SerPl Elph-Mcnc: 3.5 g/dL (ref 2.9–4.4)
Alpha2 Glob SerPl Elph-Mcnc: 0.7 g/dL (ref 0.4–1.0)
B-Globulin SerPl Elph-Mcnc: 1 g/dL (ref 0.7–1.3)
GAMMA GLOB SERPL ELPH-MCNC: 0.5 g/dL (ref 0.4–1.8)
GLOBULIN, TOTAL: 2.5 g/dL (ref 2.2–3.9)
IGA/IMMUNOGLOBULIN A, SERUM: 206 mg/dL (ref 64–422)
IgG, Qn, Serum: 560 mg/dL — ABNORMAL LOW (ref 700–1600)
IgM, Qn, Serum: 87 mg/dL (ref 26–217)
M Protein SerPl Elph-Mcnc: 0.1 g/dL — ABNORMAL HIGH
Total Protein: 6 g/dL (ref 6.0–8.5)

## 2016-07-21 ENCOUNTER — Telehealth: Payer: Self-pay | Admitting: *Deleted

## 2016-07-21 NOTE — Telephone Encounter (Signed)
Received vm call today from daughter asking for results of myeloma protein & would also like a copy mailed to them.  Returned call this pm & spoke with pt & informed that result good per Dr Burr Medico & copy to be mailed to home.  Pt was pleased & appreciative.

## 2016-07-21 NOTE — Telephone Encounter (Signed)
Myrtle, please call her back and let her know pt's MM lab are good, marker (M-protein) has dropped from 0.3 to 0.1. Thanks   Truitt Merle MD

## 2016-07-21 NOTE — Telephone Encounter (Signed)
Call received from female child.  "I need the office of Dr. Burr Medico.  I need to talk with whoever gives results."  Asked for her name.  "It doesn't matter, she won't know me.  This is her daughter and we need results for Multiple Myeloma.  Are you a nurse?  What kind of nurse are you?  Call is on speaker, mom they need to talk with you."  Patient says "I give permission for you to talk with my daughter."  Informed patient of [place] form.  Call transferred.  Next scheduled F/U is 09-07-2016.

## 2016-07-25 ENCOUNTER — Other Ambulatory Visit: Payer: Self-pay | Admitting: Hematology

## 2016-07-25 ENCOUNTER — Telehealth: Payer: Self-pay | Admitting: Hematology

## 2016-07-25 ENCOUNTER — Telehealth: Payer: Self-pay | Admitting: *Deleted

## 2016-07-25 NOTE — Telephone Encounter (Signed)
Received vm call from daughter [name] asking for return call to discuss prescription.   Returned call & spoke on speaker phone with pt & daughter.  Daughter insist that pt previously had long acting oxycodone script & Dr Burr Medico wrote for short acting.  She thinks this was a mistake.  Reviewed chart & only short acting oxycodone 5 mg has been prescribed.  Daughter continues to argue that she has had long acting & our office has made a mistake.  Encouraged pt to take oxycodone 5 mg as prescribed every 8 hours.  She took last script @ 10:30 am. Daughter insist that this doesn't help her pain but she takes it at night & she sleeps but it doesn't work during the day.  Pt reports pain # 7 in mid back.  She also reports that her throat is bad (she sounds hoarse) & feels like she is choking & sometimes she cries with pain. Informed daughter that I would discuss with Dr Burr Medico.

## 2016-07-25 NOTE — Telephone Encounter (Signed)
Pt's daughter [name] called today to report her uncontrolled back pain, and insisted to refill a long acting oxycodone which I have never prescribed. I spoke with pt's son Dr. Deedra Ehrich and he will communicate with his month to encourage her to take oxycodone 5mg  every 4-6 hours as needed for her pain control. He states his sister [name] has schizophrenia, but he does not think she will abuse her mom's narcotics. I plan to see patient in 2-3 weeks, to reassess her pain and adjust her pain medication as needed. I'll send the reschedule message to our scheduler.   Truitt Merle  07/25/2016

## 2016-07-26 ENCOUNTER — Telehealth: Payer: Self-pay | Admitting: *Deleted

## 2016-07-26 NOTE — Telephone Encounter (Signed)
Received vm call from pt regarding refill on medication & second call @ 3:35 pm from [place], daughter. Returned call this pm & spoke with pt & asked if she need refill on her oxycodone.  Daughter spoke up & said she needs the long acting.  Informed that we checked with pharmacy & pt has always had the immediate release & that we are wrong.  Informed daughter that we were not going to argue with her about this but would be glad to refill immediate release med.  She cont to argue.  Informed that I would hang up & we will schedule pt to see Dr Burr Medico on Thursday to discuss.

## 2016-07-27 ENCOUNTER — Telehealth: Payer: Self-pay | Admitting: Hematology

## 2016-07-27 ENCOUNTER — Telehealth: Payer: Self-pay | Admitting: *Deleted

## 2016-07-27 NOTE — Telephone Encounter (Signed)
Called and spoke to brother, Nehemiah Massed,  Phone [phone] at request of Dr. Burr Medico.  He is willing to bring his sister in to see Dr. Burr Medico regarding pain medication without Zani's dtr. [name].   Will ask Audie Clear to call Nehemiah Massed to schedule appointment.

## 2016-07-27 NOTE — Telephone Encounter (Signed)
Opened in error

## 2016-07-27 NOTE — Telephone Encounter (Signed)
Contacted patient re f/u 2/1 per 1/30 schedule message. Spoke with patient with dtr present and per dtr they are not interested in an appointment - they are just trying to get a prescription refilled. Patient/dtr transferred to desk nurses' voicemail.   This message has been routed to [place] and desk nurse.

## 2016-07-27 NOTE — Telephone Encounter (Signed)
Spoke with Mr. Mikayla Marks regarding appointment for Ms. Mikayla Marks tomorrow and per Mr. Mikayla Marks he will not be able to bring her in tomorrow.   Per Mr. Mikayla Marks he spoke with his sister and she does not want to come, she believes the prescription is something that should be automatically refilled and they explained to her that this is probably a narcotic and cannot just be refilled. Per Mr. Mikayla Lords Ms. Mikayla Marks has about 2 days worth of medicine left and has been made aware that after that she will probably be in pain and she is willing to suffer those consequences.   Mr. Mikayla Marks apologies for the situation. This message has been routed to Dr. Burr Medico and her desk nurse.

## 2016-07-28 ENCOUNTER — Ambulatory Visit: Payer: Self-pay | Admitting: Hematology

## 2016-07-28 ENCOUNTER — Telehealth: Payer: Self-pay | Admitting: Hematology

## 2016-07-28 NOTE — Telephone Encounter (Signed)
lvm to inform pt of r/s appt to 2/22 per YF LOS

## 2016-07-29 ENCOUNTER — Telehealth: Payer: Self-pay | Admitting: Hematology

## 2016-07-29 NOTE — Telephone Encounter (Signed)
Pt's daughter [name] has called [place] again, and I called her back. She insisted that " you prescribed the wrong pain medication", " My mother needs long acting pain medication" and "we are not coming to see you". I explained to her that I only prescribed oxycodone in the past, and I can not start a new pain medication without seeing her mother. I made it clear that I will only speak to pt's POA Richard in the future. I will let my staff know that we are not going to return her calls in the future.   Truitt Merle  07/29/2016

## 2016-07-29 NOTE — Telephone Encounter (Signed)
I spoke with pt's POA and brother [name] about the communication issue with pt and her daughter [name]. Due to [name]'s mental illness, I suggest Richard to be the contact person between pt and [place], I will not call [facility] back. [name] is aware of the situation, and completed agree with the plan. He will talk to the pt, and bring her to my clinic if needed so I can evaluate her pain and adjust her pain medication if needed.   Truitt Merle  07/29/2016

## 2016-08-03 ENCOUNTER — Telehealth: Payer: Self-pay | Admitting: Internal Medicine

## 2016-08-03 DIAGNOSIS — C9000 Multiple myeloma not having achieved remission: Secondary | ICD-10-CM

## 2016-08-03 DIAGNOSIS — G8929 Other chronic pain: Secondary | ICD-10-CM

## 2016-08-03 DIAGNOSIS — M549 Dorsalgia, unspecified: Principal | ICD-10-CM

## 2016-08-03 MED ORDER — METHOCARBAMOL 500 MG PO TABS: 500.0000 mg | ORAL_TABLET | Freq: Three times a day (TID) | ORAL | 0 refills | Status: DC | PRN

## 2016-08-03 NOTE — Telephone Encounter (Signed)
Please advise if okay to refill-last filled by hosp when Pt was admitted.

## 2016-08-03 NOTE — Telephone Encounter (Signed)
Rx sent. Pain management referral placed.

## 2016-08-03 NOTE — Telephone Encounter (Signed)
Daughter - [name]   Refill on Robaxin   Pharmacy : [place], [place].

## 2016-08-03 NOTE — Telephone Encounter (Signed)
ALSO, she said that pt need a referral to pain management. She would like to have it set up with Dr. Marciano Sequin phone # [phone]

## 2016-08-03 NOTE — Telephone Encounter (Signed)
Ok robaxin #45, no RF Ok pain mngmt referral, dx back pain , h/o multiple myeloma

## 2016-08-16 ENCOUNTER — Other Ambulatory Visit: Payer: Self-pay | Admitting: Internal Medicine

## 2016-08-16 MED ORDER — OXYCODONE HCL 5 MG PO TABS: 5.0000 mg | ORAL_TABLET | Freq: Three times a day (TID) | ORAL | 0 refills | Status: DC | PRN

## 2016-08-16 NOTE — Telephone Encounter (Signed)
Patient scheduled.

## 2016-08-16 NOTE — Telephone Encounter (Signed)
Please inform Pt and/or her daughter, Mikayla Marks, that Rx has been placed at front desk for pick up. Pt due for 30 minute follow-up if not already scheduled. Thank you.

## 2016-08-16 NOTE — Telephone Encounter (Signed)
Pt's daughter, [name], came into office to pick up prescription. She informed front desk that Pt has been on Oxycodone HCL extended release, med hx reviewed w/ PCP. Pt has been on Oxycodone 5mg  immediate release tablets, informed [name] of such, was informed that "the chart lies" and that she had paperwork and bottles showing differently. Asked to see paperwork and bottles from pharmacy and informed she did not have them, informed her that I can only go by what the chart says at this time. Offered her the Oxycodone 5mg  immediate release tabs prescription; [name] continued to be verbally harassing, yelling over counter. She then asks me what job title I am, I informed her that I am a Physiological scientist, she states "oh, your not a nurse?" I again inform her no, I am not. [name] states "I refuse to speak with someone w/ a lower degree than an RN." At this point, I take back the Oxycodone prescription and informed I would get the office Psychologist, counselling. [name] continues to yell across counter as I walk away. Dr. Larose Kells still in office and went to front to speak with [facility]. They agreed for Pt to try Oxycodone 5mg  immediate release tabs TID PRN and to call next week to let us know how Pt is doing.

## 2016-08-16 NOTE — Telephone Encounter (Signed)
Rx printed, awaiting MD signature.  

## 2016-08-16 NOTE — Telephone Encounter (Signed)
Okay 70, no refills 

## 2016-08-16 NOTE — Telephone Encounter (Signed)
Caller name: [name] Relationship to patient:Daughter Can be reached: [phone]   Reason for call: Refill oxyCODONE (OXY IR/ROXICODONE) 5 MG immediate release tablet

## 2016-08-17 ENCOUNTER — Telehealth: Payer: Self-pay | Admitting: *Deleted

## 2016-08-17 NOTE — Telephone Encounter (Signed)
Informed by telephone operator today that [facility] called & was angry & wanted to cancel appt for tomorrow for her mother.  Per Dr Burr Medico, notified brother who is local & asked if he could make sure pt got to appt.  He has a commitment but his wife will try to convince [name] to allow her to bring Mikayla Marks.

## 2016-08-18 ENCOUNTER — Telehealth: Payer: Self-pay | Admitting: Hematology

## 2016-08-18 ENCOUNTER — Ambulatory Visit: Payer: Self-pay | Admitting: Hematology

## 2016-08-18 NOTE — Telephone Encounter (Signed)
Patients daughter in law called and said that they were unable to get her to agree to come in to her 10 am appointment and just wanted you to know

## 2016-08-31 ENCOUNTER — Telehealth: Payer: Self-pay | Admitting: Internal Medicine

## 2016-08-31 MED ORDER — METHOCARBAMOL 500 MG PO TABS: 500.0000 mg | ORAL_TABLET | Freq: Three times a day (TID) | ORAL | 0 refills | Status: DC | PRN

## 2016-08-31 NOTE — Telephone Encounter (Signed)
Rx sent 

## 2016-08-31 NOTE — Telephone Encounter (Signed)
Caller name: [name],[name] Relation to pt: daughter  Call back number:[phone]   Pharmacy:  [place], [place].   Reason for call:  Daughter requesting a refill methocarbamol (ROBAXIN) 500 MG tablet

## 2016-09-07 ENCOUNTER — Ambulatory Visit: Payer: Self-pay | Admitting: Hematology

## 2016-09-07 ENCOUNTER — Other Ambulatory Visit: Payer: Self-pay

## 2016-09-14 ENCOUNTER — Telehealth: Payer: Self-pay | Admitting: Internal Medicine

## 2016-09-14 ENCOUNTER — Ambulatory Visit: Payer: Self-pay | Admitting: Internal Medicine

## 2016-09-14 NOTE — Telephone Encounter (Signed)
Med was refilled on 08/31/2016.

## 2016-09-14 NOTE — Telephone Encounter (Signed)
Ok, no charge  

## 2016-09-14 NOTE — Telephone Encounter (Signed)
Pt's daughter [name] lvm at 8:31 to cancel pt's apt due to weather. Called pt back to reschedule. No answer. LVM to call back to reschedule appt.

## 2016-09-14 NOTE — Telephone Encounter (Signed)
Caller name: [name] Relationship to patient: Daughter Can be reached: [phone]  Pharmacy:  [place], [place] [phone] (Phone) [phone] (Fax)     Reason for call: refill on methocarbamol (ROBAXIN) 500 MG tablet

## 2016-09-16 ENCOUNTER — Telehealth: Payer: Self-pay | Admitting: Internal Medicine

## 2016-09-16 ENCOUNTER — Ambulatory Visit: Payer: Self-pay | Admitting: Internal Medicine

## 2016-09-16 MED ORDER — METHOCARBAMOL 500 MG PO TABS: 500.0000 mg | ORAL_TABLET | Freq: Two times a day (BID) | ORAL | 0 refills | Status: DC | PRN

## 2016-09-16 MED ORDER — OXYCODONE HCL 5 MG PO TABS: 5.0000 mg | ORAL_TABLET | Freq: Three times a day (TID) | ORAL | 0 refills | Status: DC | PRN

## 2016-09-16 NOTE — Telephone Encounter (Signed)
Patient's daughter called to cancel appointment for today and also wants PCP to refill muscle relaxer for patient. Plse adv

## 2016-09-16 NOTE — Telephone Encounter (Signed)
Okay oxycodone #70 and okay Robaxin # 60 No RF

## 2016-09-16 NOTE — Telephone Encounter (Signed)
Again Robaxin was refilled for 30 days on 08/31/2016.

## 2016-09-16 NOTE — Telephone Encounter (Signed)
Daughter notified, and Rx placed in bin

## 2016-09-16 NOTE — Telephone Encounter (Signed)
Relation to TR:VUYE Call back [phone]   Reason for call:  Patient requesting a refill oxyCODONE (OXY IR/ROXICODONE) 5 MG immediate release tablet

## 2016-09-16 NOTE — Telephone Encounter (Signed)
Daughter states patient also needs a refill on Robaxin, stating patient was only given 45 pills and she takes medication at least twice a day and will be running out soon.    Pt has a medication follow up scheduled for Tuesday at 1:15 pm with Dr. Larose Kells.

## 2016-09-16 NOTE — Telephone Encounter (Signed)
Rx printed, signed and placed up front. Tiffany-will you call Pt and/or her daughter [name], Rx's ready for pick up. Thank you.

## 2016-09-16 NOTE — Telephone Encounter (Signed)
Please advise 

## 2016-09-20 ENCOUNTER — Ambulatory Visit (INDEPENDENT_AMBULATORY_CARE_PROVIDER_SITE_OTHER): Payer: Medicare Other | Admitting: Internal Medicine

## 2016-09-20 ENCOUNTER — Encounter: Payer: Self-pay | Admitting: Internal Medicine

## 2016-09-20 VITALS — BP 122/86 | HR 101 | Temp 97.4°F | Ht 60.0 in | Wt 136.5 lb

## 2016-09-20 DIAGNOSIS — C9030 Solitary plasmacytoma not having achieved remission: Secondary | ICD-10-CM

## 2016-09-20 DIAGNOSIS — M549 Dorsalgia, unspecified: Secondary | ICD-10-CM

## 2016-09-20 DIAGNOSIS — G8929 Other chronic pain: Secondary | ICD-10-CM

## 2016-09-20 MED ORDER — METHOCARBAMOL 500 MG PO TABS: 500.0000 mg | ORAL_TABLET | Freq: Two times a day (BID) | ORAL | 3 refills | Status: DC | PRN

## 2016-09-20 MED ORDER — FENTANYL 12 MCG/HR TD PT72: 12.5000 ug | MEDICATED_PATCH | TRANSDERMAL | 0 refills | Status: DC

## 2016-09-20 NOTE — Progress Notes (Signed)
Pre visit review using our clinic review tool, if applicable. No additional management support is needed unless otherwise documented below in the visit note. 

## 2016-09-20 NOTE — Patient Instructions (Signed)
Use the fentanyl patch: 1 patch every 3 days  You still can take oxycodone 2 times a day as needed  Taking Tylenol is also okay  Call in 2 weeks, we could increase the fentanyl patch dose if needed.  Next visit in 2 months

## 2016-09-20 NOTE — Progress Notes (Signed)
Subjective:    Patient ID: Mikayla Marks, female    DOB: 1927/10/31, 83 y.o.   MRN: [id number]  DOS:  09/20/2016 Type of visit - description :  Routine visit, here with her daughter Interval history:  Oncology note reviewed, she was seen 07/05/2016:  Status post definitive XRT to the back. Systemic therapy not recommended d/t comorbidities, Rx observation. Consider treatment if she develops multiple myeloma related bone lesions or other organ damage. Next visit 12-2016  Self discontinued gabapentin, she felt it wasn't effective, by stopping it the pain did not increase. Currently taking OxyContin twice a day as needed and Robaxin which helps. Pain is steady 24/7, on and off exacerbations with movements, sometimes pain is as high as 8-10.  Has a skin irritation at the area of radiation therapy.  Review of Systems  no fever chills No lower extremity edema No cough Mild difficulty breathing at baseline Occasional dysphagia   Past Medical History:  Diagnosis Date  . Carotid artery stenosis   . Chronic kidney disease   . Hyperlipidemia   . HYPERTENSION   . Osteoporosis   . Paranoid schizophrenia ([place])   . SCC (squamous cell carcinoma)    in situ R pretibial area    Past Surgical History:  Procedure Laterality Date  . BREAST BIOPSY     benign tumor  . CATARACT EXTRACTION    . CHOLECYSTECTOMY    . IR GENERIC HISTORICAL  04/04/2016   IR FLUORO GUIDED NEEDLE PLC ASPIRATION/INJECTION LOC 04/04/2016 Luanne Bras, MD [facility]  . LASER ABLATION Left    left LE   . LEG SURGERY Right 10/2012   Ruptured Veins  . PARTIAL HYSTERECTOMY     due to bleeding  . TONSILLECTOMY      Social History   Social History  . Marital status: Single    Spouse name: N/A  . Number of children: 3  . Years of education: N/A   Occupational History  . not working    Social History Main Topics  . Smoking status: Never Smoker  . Smokeless tobacco: Never Used  . Alcohol use No    . Drug use: No  . Sexual activity: No   Other Topics Concern  . Not on file   Social History Narrative   Daughter lives with her [name]) ; pt is  widow   Brother [name] in [place], lives 5 min away [phone]            Allergies as of 09/20/2016      Reactions   Other Other (See Comments)   5FU cream - stroke like symptoms   Bacitracin-polymyxin B Other (See Comments)   Reaction unknown.    Penicillins Rash      Medication List       Accurate as of 09/20/16  6:18 PM. Always use your most recent med list.          CALTRATE 600 PO Take 1 tablet by mouth daily.   cycloSPORINE 0.05 % ophthalmic emulsion Commonly known as:  RESTASIS Place 1 drop into both eyes 2 (two) times daily.   docusate sodium 100 MG capsule Commonly known as:  COLACE Take 1 capsule (100 mg total) by mouth 2 (two) times daily.   fentaNYL 12 MCG/HR Commonly known as:  DURAGESIC Place 1 patch (12.5 mcg total) onto the skin every 3 (three) days.   gabapentin 100 MG capsule Commonly known as:  NEURONTIN Take 1 capsule (100 mg total) by  mouth 3 (three) times daily.   methocarbamol 500 MG tablet Commonly known as:  ROBAXIN Take 1 tablet (500 mg total) by mouth 2 (two) times daily as needed for muscle spasms.   multivitamin with minerals tablet Take 1 tablet by mouth 4 (four) times a week.   oxyCODONE 5 MG immediate release tablet Commonly known as:  Oxy IR/ROXICODONE Take 1 tablet (5 mg total) by mouth 3 (three) times daily as needed for moderate pain.   SALONPAS EX Apply 1 patch topically as needed (for pain).   senna 8.6 MG Tabs tablet Commonly known as:  SENOKOT Take 1 tablet (8.6 mg total) by mouth 2 (two) times daily.          Objective:   Physical Exam BP 122/86 (BP Location: Right Arm, Patient Position: Sitting, Cuff Size: Normal)   Pulse (!) 101   Temp 97.4 F (36.3 C) (Oral)   Ht 5' (1.524 m)   Wt 136 lb 8 oz (61.9 kg)   SpO2 95%   BMI 26.66 kg/m  General:    Well developed, well nourished . NAD.  HEENT:  Normocephalic . Face symmetric, atraumatic Lungs:  CTA B Normal respiratory effort, no intercostal retractions, no accessory muscle use. Heart: RRR,  no murmur.  Trace pretibial edema bilaterally  Skin:   At the mid thoracic back, has a square-shaped area of redness, at the center of the rash there is skin denudation, approximately 111 cm. No discharge. MSK: Has some bony protuberance at ~ T8. Neurologic:  alert & oriented X3.  Speech normal, gait limited and assisted by a walker. Psych--  No anxious or depressed appearing.      Assessment & Plan:   Assessment HTN Hyperlipidemia Schizophrenia Osteoporosis : Per DEXA 2010, DEXA 2013 with Dr. Amalia Hailey: Osteopenia. 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 Vitamin D deficiency Varicose pain, lower extremity edema: [place] (-) DVT 08-2014 SCC, R pretibial  PLAN  Pain management Pt self d/c gabapentin because "it wasn't helping", she was taking a low-dose, offered to retry a higher dose but she is quite reluctant. She is currently taking immediate release Oxycodone 5 mg, 1 po bid  She has pain constantly with exacerbations.  Rx a trial with fentanyl patch 12.5 mg 1 q 3 day, 15 day supply RX printed; cont oxycodone as needed. I'm starting fentanyl @ a low-dose, if pain is not controlled we can go up. Patient aware. Continue with Robaxin which according to the patient helps. Skin irritation, I suggested OTC antibiotic ointment and hydrocortisone. Likely has post radiation irritation. Multiple myeloma: saw hem-onc 07/05/2016:  Status post definitive XRT to the back. Systemic therapy not recommended d/t comorbidities, Rx observation. Consider treatment if she develops multiple myeloma related bone  lesions or other organ damage. Next visitw/ hem-onc 12-2016 RTC 2 months  Today, I spent more than  25  min with the patient & daughter, multiple questions answered to the best of my ability, explained the patient the benefits of having a  patch and the need to continue taking prn medication. Also discussing gabapenting but she eventually declined. Also reviewed the notes  from other providers.

## 2016-09-20 NOTE — Assessment & Plan Note (Signed)
Pain management Pt self d/c gabapentin because "it wasn't helping", she was taking a low-dose, offered to retry a higher dose but she is quite reluctant. She is currently taking immediate release Oxycodone 5 mg, 1 po bid  She has pain constantly with exacerbations.  Rx a trial with fentanyl patch 12.5 mg 1 q 3 day, 15 day supply RX printed; cont oxycodone as needed. I'm starting fentanyl @ a low-dose, if pain is not controlled we can go up. Patient aware. Continue with Robaxin which according to the patient helps. Skin irritation, I suggested OTC antibiotic ointment and hydrocortisone. Likely has post radiation irritation. Multiple myeloma: saw hem-onc 07/05/2016:  Status post definitive XRT to the back. Systemic therapy not recommended d/t comorbidities, Rx observation. Consider treatment if she develops multiple myeloma related bone lesions or other organ damage. Next visitw/ hem-onc 12-2016 RTC 2 months

## 2016-10-24 ENCOUNTER — Telehealth: Payer: Self-pay | Admitting: Internal Medicine

## 2016-10-24 MED ORDER — OXYCODONE HCL 5 MG PO TABS: 5.0000 mg | ORAL_TABLET | Freq: Three times a day (TID) | ORAL | 0 refills | Status: DC | PRN

## 2016-10-24 MED ORDER — METHOCARBAMOL 500 MG PO TABS: 500.0000 mg | ORAL_TABLET | Freq: Two times a day (BID) | ORAL | 3 refills | Status: DC | PRN

## 2016-10-24 NOTE — Telephone Encounter (Signed)
Caller name: [name],[name] Relation to pt: daughter Call back number:[phone] Pharmacy: [facility], [place].  Reason for call:  Daughter requesting a refill oxyCODONE (OXY IR/ROXICODONE) 5 MG immediate release tablet (no patches) and methocarbamol (ROBAXIN) 500 MG tablet (pharmacy states no refills on file)

## 2016-10-24 NOTE — Telephone Encounter (Signed)
Oxycodone Rx printed, awaiting MD signature. Robaxin Rx sent.

## 2016-10-24 NOTE — Telephone Encounter (Signed)
LMOM informing Pt's daughter, [name] that Rx has been placed at front desk for pick up and to have Pt use patches daily. Instructed to call if questions/concerns.

## 2016-10-24 NOTE — Telephone Encounter (Signed)
Okay #70, no refills. Also advised to take the patch daily

## 2016-11-28 ENCOUNTER — Telehealth: Payer: Self-pay | Admitting: Internal Medicine

## 2016-11-28 NOTE — Telephone Encounter (Signed)
Pt's daughter, [name] called requesting refill on Oxycodone 5mg  for Pt.   Last OV: 09/20/2016 Last Fill: 10/24/2016 #70 and 0RF UDS: None  Please advise.

## 2016-11-28 NOTE — Telephone Encounter (Signed)
Caller name: [name] Relationship to patient: Daughter Can be reached: [phone]  Pharmacy:  Reason for call: Refill oxyCODONE (OXY IR/ROXICODONE) 5 MG immediate release tablet [[id number]]

## 2016-11-28 NOTE — Telephone Encounter (Signed)
Okay 70, no refills 

## 2016-11-29 MED ORDER — OXYCODONE HCL 5 MG PO TABS: 5.0000 mg | ORAL_TABLET | Freq: Three times a day (TID) | ORAL | 0 refills | Status: DC | PRN

## 2016-11-29 NOTE — Telephone Encounter (Signed)
Please inform Pt and/or her daughter, Mikayla Marks that Rx has been placed at front desk for pick up. Thank you.

## 2016-11-29 NOTE — Telephone Encounter (Signed)
Rx printed, awaiting MD signature.  

## 2016-11-29 NOTE — Telephone Encounter (Signed)
Daughter notified 

## 2016-12-29 ENCOUNTER — Telehealth: Payer: Self-pay | Admitting: Internal Medicine

## 2016-12-29 NOTE — Telephone Encounter (Signed)
°  Relation to DU:PBDH Call back number:[phone]   Reason for call:  Patient requesting oxyCODONE (OXY IR/ROXICODONE) 5 MG immediate release tablet

## 2016-12-30 MED ORDER — OXYCODONE HCL 5 MG PO TABS: 5.0000 mg | ORAL_TABLET | Freq: Three times a day (TID) | ORAL | 0 refills | Status: DC | PRN

## 2016-12-30 NOTE — Telephone Encounter (Signed)
Okay #70, no refills. Needs office visit before next refill

## 2016-12-30 NOTE — Telephone Encounter (Signed)
Please inform Pt that Rx has been placed at front desk for pick up at her convenience. Thank you.  

## 2016-12-30 NOTE — Telephone Encounter (Signed)
Pt is requesting refill on oxycodone 5mg .  Last OV: 09/20/2016 Last Fill: 11/29/2016 #70 and 0RF (Pt sig: 1 tab tid prn) UDS: None  [place] Controlled Substance Contract printed.   Please advise.

## 2016-12-30 NOTE — Telephone Encounter (Signed)
Patient informed. 

## 2016-12-30 NOTE — Telephone Encounter (Signed)
Rx printed, awaiting MD signature.  

## 2017-02-01 ENCOUNTER — Ambulatory Visit: Payer: Self-pay | Admitting: Internal Medicine

## 2017-02-03 ENCOUNTER — Encounter: Payer: Self-pay | Admitting: Internal Medicine

## 2017-02-03 ENCOUNTER — Ambulatory Visit (INDEPENDENT_AMBULATORY_CARE_PROVIDER_SITE_OTHER): Payer: Medicare Other | Admitting: Internal Medicine

## 2017-02-03 VITALS — BP 127/68 | HR 97 | Temp 98.1°F | Resp 14 | Ht 60.0 in | Wt 133.4 lb

## 2017-02-03 DIAGNOSIS — C9000 Multiple myeloma not having achieved remission: Secondary | ICD-10-CM | POA: Diagnosis not present

## 2017-02-03 LAB — COMPREHENSIVE METABOLIC PANEL
ALT: 14 U/L (ref 0–35)
AST: 22 U/L (ref 0–37)
Albumin: 4.3 g/dL (ref 3.5–5.2)
Alkaline Phosphatase: 58 U/L (ref 39–117)
BILIRUBIN TOTAL: 0.5 mg/dL (ref 0.2–1.2)
BUN: 19 mg/dL (ref 6–23)
CALCIUM: 9.5 mg/dL (ref 8.4–10.5)
CO2: 27 meq/L (ref 19–32)
CREATININE: 0.92 mg/dL (ref 0.40–1.20)
Chloride: 106 mEq/L (ref 96–112)
GFR: 61.34 mL/min (ref >60.00)
GLUCOSE: 103 mg/dL — AB (ref 70–99)
Potassium: 4.1 mEq/L (ref 3.5–5.1)
SODIUM: 140 meq/L (ref 135–145)
Total Protein: 6.8 g/dL (ref 6.0–8.3)

## 2017-02-03 LAB — CBC WITH DIFFERENTIAL/PLATELET
BASOS PCT: 0.8 % (ref 0.0–3.0)
Basophils Absolute: 0 10*3/uL (ref 0.0–0.1)
EOS ABS: 0.3 10*3/uL (ref 0.0–0.7)
Eosinophils Relative: 5.2 % — ABNORMAL HIGH (ref 0.0–5.0)
HEMATOCRIT: 40.2 % (ref 36.0–46.0)
HEMOGLOBIN: 13 g/dL (ref 12.0–15.0)
LYMPHS ABS: 1.3 10*3/uL (ref 0.7–4.0)
LYMPHS PCT: 25.1 % (ref 12.0–46.0)
MCHC: 32.4 g/dL (ref 30.0–36.0)
MCV: 98.2 fl (ref 78.0–100.0)
MONO ABS: 0.5 10*3/uL (ref 0.1–1.0)
Monocytes Relative: 9.4 % (ref 3.0–12.0)
Neutro Abs: 3.1 10*3/uL (ref 1.4–7.7)
Neutrophils Relative %: 59.5 % (ref 43.0–77.0)
Platelets: 239 10*3/uL (ref 150.0–400.0)
RBC: 4.09 Mil/uL (ref 3.87–5.11)
RDW: 13.4 % (ref 11.5–15.5)
WBC: 5.2 10*3/uL (ref 4.0–10.5)

## 2017-02-03 MED ORDER — TIZANIDINE HCL 4 MG PO TABS: 4.0000 mg | ORAL_TABLET | Freq: Four times a day (QID) | ORAL | 1 refills | Status: DC | PRN

## 2017-02-03 MED ORDER — OXYCODONE HCL 5 MG PO TABS: 5.0000 mg | ORAL_TABLET | Freq: Three times a day (TID) | ORAL | 0 refills | Status: DC | PRN

## 2017-02-03 NOTE — Progress Notes (Signed)
Pre visit review using our clinic review tool, if applicable. No additional management support is needed unless otherwise documented below in the visit note. 

## 2017-02-03 NOTE — Progress Notes (Signed)
Subjective:    Patient ID: Mikayla Marks, female    DOB: 07/27/1927, 83 y.o.   MRN: [id number]  DOS:  02/03/2017 Type of visit - description : rov, Here with her daughter. Interval history: In general feeling well. Pain has significantly decrease, medication list updated.   Review of Systems   Past Medical History:  Diagnosis Date  . Carotid artery stenosis   . Chronic Marks disease   . Hyperlipidemia   . HYPERTENSION   . Osteoporosis   . Paranoid schizophrenia ([place])   . SCC (squamous cell carcinoma)    in situ R pretibial area    Past Surgical History:  Procedure Laterality Date  . BREAST BIOPSY     benign tumor  . CATARACT EXTRACTION    . CHOLECYSTECTOMY    . IR GENERIC HISTORICAL  04/04/2016   IR FLUORO GUIDED NEEDLE PLC ASPIRATION/INJECTION LOC 04/04/2016 Mikayla Bras, MD [facility]  . LASER ABLATION Left    left LE   . LEG SURGERY Right 10/2012   Ruptured Veins  . PARTIAL HYSTERECTOMY     due to bleeding  . TONSILLECTOMY      Social History   Social History  . Marital status: Single    Spouse name: N/A  . Number of children: 3  . Years of education: N/A   Occupational History  . not working    Social History Main Topics  . Smoking status: Never Smoker  . Smokeless tobacco: Never Used  . Alcohol use No  . Drug use: No  . Sexual activity: No   Other Topics Concern  . Not on file   Social History Narrative   Daughter lives with her Mikayla Marks) ; pt is  widow   Brother Mikayla Marks in [place], lives 5 min away [phone]            Allergies as of 02/03/2017      Reactions   Other Other (See Comments)   5FU cream - stroke like symptoms   Bacitracin-polymyxin B Other (See Comments)   Reaction unknown.    Penicillins Rash      Medication List       Accurate as of 02/03/17  1:35 PM. Always use your most recent med list.          CALTRATE 600 PO Take 1 tablet by mouth daily.   cycloSPORINE 0.05 % ophthalmic emulsion Commonly  known as:  RESTASIS Place 1 drop into both eyes 2 (two) times daily.   gabapentin 100 MG capsule Commonly known as:  NEURONTIN Take 1 capsule (100 mg total) by mouth 3 (three) times daily.   multivitamin with minerals tablet Take 1 tablet by mouth 4 (four) times a week.   oxyCODONE 5 MG immediate release tablet Commonly known as:  Oxy IR/ROXICODONE Take 1 tablet (5 mg total) by mouth 3 (three) times daily as needed for moderate pain.   SALONPAS EX Apply 1 patch topically as needed (for pain).   senna 8.6 MG Tabs tablet Commonly known as:  SENOKOT Take 1 tablet (8.6 mg total) by mouth 2 (two) times daily.   tiZANidine 4 MG tablet Commonly known as:  ZANAFLEX Take 4 mg by mouth every 6 (six) hours as needed for muscle spasms.          Objective:   Physical Exam BP 127/68 (BP Location: Left Arm, Patient Position: Sitting, Cuff Size: Small)   Pulse 97   Temp 98.1 F (36.7 C) (Oral)  Resp 14   Ht 5' (1.524 m)   Wt 133 lb 6 oz (60.5 kg)   SpO2 93%   BMI 26.05 kg/m  General:   Well developed, well nourished . NAD.  HEENT:  Normocephalic . Face symmetric, atraumatic Lungs:  CTA B Normal respiratory effort, no intercostal retractions, no accessory muscle use. Heart: RRR,  no murmur.  No pretibial edema bilaterally  Skin: Not pale. Not jaundice Neurologic:  Alert. Marland Kitchen  Speech normal, gait appropriate for age and unassisted Psych--   Patient was cooperative with me but somehow belligerent towards the daughter      Assessment & Plan:   Assessment HTN Hyperlipidemia Schizophrenia Osteoporosis : Per DEXA 2010, DEXA 2013 with Dr. Amalia Marks: Osteopenia. 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 Vitamin D deficiency Varicose pain, lower extremity edema: [place] (-) DVT 08-2014 SCC,  R pretibial  PLAN  HTN: on no meds, BP ok Hyperlipidemia: Currently taking no treatment Multiple myeloma:  Last visit with onc 07/05/2016, summary:  Status post definitive XRT to the back. Systemic therapy not recommended d/t comorbidities, Rx observation. Consider treatment if she develops multiple myeloma related bone lesions or other organ damage. Next visitw/ hem-onc 12-2016 -Today she seems to be doing well, pain is controlled with oxycodone 2-3 tablets daily, tizanidine as needed. -She decided not to use a fentanyl patch. Not taking gabapentin but likes to keep in the least for use as needed. Robaxin discontinued because tablets were too large to take. -The patient and her daughter argue about the need to see oncology, the patient's daughter reports that there is no need for follow-up. -will check a CBC, CMP -I told him that according to my notes she needed a follow-up, encouraged him to call the oncology office. RTC 6 months. Sooner if needed.

## 2017-02-03 NOTE — Patient Instructions (Signed)
GO TO THE LAB : Get the blood work     GO TO THE FRONT DESK Schedule your next appointment for a  checkup in 6 months   Please contact the oncology office, I believe you need to see them for a routine checkup

## 2017-02-04 NOTE — Assessment & Plan Note (Signed)
HTN: on no meds, BP ok Hyperlipidemia: Currently taking no treatment Multiple myeloma:  Last visit with onc 07/05/2016, summary:  Status post definitive XRT to the back. Systemic therapy not recommended d/t comorbidities, Rx observation. Consider treatment if she develops multiple myeloma related bone lesions or other organ damage. Next visitw/ hem-onc 12-2016 -Today she seems to be doing well, pain is controlled with oxycodone 2-3 tablets daily, tizanidine as needed. -She decided not to use a fentanyl patch. Not taking gabapentin but likes to keep in the least for use as needed. Robaxin discontinued because tablets were too large to take. -The patient and her daughter argue about the need to see oncology, the patient's daughter reports that there is no need for follow-up. -will check a CBC, CMP -I told him that according to my notes she needed a follow-up, encouraged him to call the oncology office. RTC 6 months. Sooner if needed.   

## 2017-03-06 ENCOUNTER — Telehealth: Payer: Self-pay | Admitting: Internal Medicine

## 2017-03-06 MED ORDER — OXYCODONE HCL 5 MG PO TABS: 5.0000 mg | ORAL_TABLET | Freq: Three times a day (TID) | ORAL | 0 refills | Status: DC | PRN

## 2017-03-06 NOTE — Telephone Encounter (Signed)
Okay 70, no refills

## 2017-03-06 NOTE — Telephone Encounter (Signed)
Pt is requesting refill on Oxycodone.   Last OV: 02/03/2017  Last Fill: 02/03/2017 #70 and 0RF UDS: None  [place] database 12/30/2016 (in media); no issues noted  Please advise.

## 2017-03-06 NOTE — Telephone Encounter (Signed)
Daughter informed

## 2017-03-06 NOTE — Telephone Encounter (Signed)
Rx printed, awaiting MD signature.  

## 2017-03-06 NOTE — Telephone Encounter (Signed)
Mikayla Marks - daughter requested    Refill for oxyCODONE    CB: [phone]

## 2017-03-06 NOTE — Telephone Encounter (Signed)
Please inform Pt and/or her daughter that Rx has been placed at front desk for pick up at her convenience. Thank you.

## 2017-03-08 DIAGNOSIS — H1012 Acute atopic conjunctivitis, left eye: Secondary | ICD-10-CM | POA: Diagnosis not present

## 2017-04-12 ENCOUNTER — Telehealth: Payer: Self-pay | Admitting: Internal Medicine

## 2017-04-12 MED ORDER — TIZANIDINE HCL 4 MG PO TABS: 4.0000 mg | ORAL_TABLET | Freq: Four times a day (QID) | ORAL | 0 refills | Status: DC | PRN

## 2017-04-12 MED ORDER — OXYCODONE HCL 5 MG PO TABS: 5.0000 mg | ORAL_TABLET | Freq: Three times a day (TID) | ORAL | 0 refills | Status: DC | PRN

## 2017-04-12 NOTE — Telephone Encounter (Signed)
Pt is requesting refill on Oxycodone 5mg  and tizanidine 4mg .  Last OV: 02/03/2017 Last Fill on Oxycodone: 03/06/2017 #70 and 0RF Last Fill on tizanidine 4mg : 02/03/2017 #60 and 1RF UDS: None  NCCR printed; no issues noted.   Please advise.

## 2017-04-12 NOTE — Telephone Encounter (Signed)
Rx's printed, awaiting MD signature.  

## 2017-04-12 NOTE — Telephone Encounter (Signed)
Alaja Goldinger Daughter [phone]   oxyCODONE (OXY IR/ROXICODONE) 5 MG immediate release tablet tiZANidine (ZANAFLEX) 4 MG tablet  [name] called to say patient needs refills. Please call when ready for pickup.

## 2017-04-12 NOTE — Telephone Encounter (Signed)
Okay oxycodone #70 no refills Okay tizanidine 4 mg #60 no refills

## 2017-04-13 NOTE — Telephone Encounter (Signed)
Please inform Pt and/or her daughter that Rx's have been placed at front desk for pick up at her convenience. Thank you.

## 2017-04-13 NOTE — Telephone Encounter (Signed)
Daughter informed

## 2017-05-12 ENCOUNTER — Telehealth: Payer: Self-pay | Admitting: Internal Medicine

## 2017-05-12 DIAGNOSIS — M549 Dorsalgia, unspecified: Secondary | ICD-10-CM

## 2017-05-12 MED ORDER — TIZANIDINE HCL 4 MG PO TABS: 4.0000 mg | ORAL_TABLET | Freq: Four times a day (QID) | ORAL | 0 refills | Status: DC | PRN

## 2017-05-12 MED ORDER — OXYCODONE HCL 5 MG PO TABS: 5.0000 mg | ORAL_TABLET | Freq: Three times a day (TID) | ORAL | 0 refills | Status: DC | PRN

## 2017-05-12 NOTE — Telephone Encounter (Signed)
Pt is requesting refill on Oxycodone and Zanaflex.  Last OV: 02/03/2017 Last Fill on Oxycodone: 04/12/2017 #70 and 0RF Last Fill on Zanaflex: 04/12/2017 #60 and 0RF UDS: None   [place]CR on 04/20/2017- in media  Please advise.

## 2017-05-12 NOTE — Telephone Encounter (Signed)
Rx's printed, awaiting MD signature.  

## 2017-05-12 NOTE — Telephone Encounter (Signed)
Pt has appt to get flu shot on 05/25/2017- okay per PCP to get UDS at that time.

## 2017-05-12 NOTE — Telephone Encounter (Signed)
Okay #70 and 60 respectively Needs to have a UDS at the time of pickup. Also, please let them know that what she is due for a visit with hematology oncology

## 2017-05-12 NOTE — Telephone Encounter (Signed)
Copied from [place] [id number]. Topic: General - Other >> May 12, 2017 10:39 AM Oneta Rack wrote: Relation to JX:BJYN Call back number:[phone] Pharmacy: [facility], [place].  Reason for call:  Daughter requesting refill tiZANidine (ZANAFLEX) 4 MG tablet and oxyCODONE (OXY IR/ROXICODONE) 5 MG immediate release tablet

## 2017-05-12 NOTE — Telephone Encounter (Signed)
Please inform Pt and/or her daughter that Rx's have been placed at the front desk for pick up at their convenience. Thank you.

## 2017-05-17 ENCOUNTER — Ambulatory Visit: Payer: Self-pay | Admitting: *Deleted

## 2017-05-25 ENCOUNTER — Ambulatory Visit: Payer: Self-pay

## 2017-05-30 ENCOUNTER — Ambulatory Visit (INDEPENDENT_AMBULATORY_CARE_PROVIDER_SITE_OTHER): Payer: Medicare Other

## 2017-05-30 DIAGNOSIS — Z23 Encounter for immunization: Secondary | ICD-10-CM | POA: Diagnosis not present

## 2017-06-12 ENCOUNTER — Telehealth: Payer: Self-pay | Admitting: Internal Medicine

## 2017-06-12 MED ORDER — OXYCODONE HCL 5 MG PO TABS: 5.0000 mg | ORAL_TABLET | Freq: Three times a day (TID) | ORAL | 0 refills | Status: DC | PRN

## 2017-06-12 NOTE — Telephone Encounter (Signed)
done

## 2017-06-12 NOTE — Telephone Encounter (Signed)
Noted! Thank you

## 2017-06-12 NOTE — Telephone Encounter (Signed)
Pt's daughter [name] requesting refill on Oxycodone for Pt.  Last OV: 02/03/2017 Last Fill: 05/12/2017 #70 and 0RF UDS: None   Please advise.

## 2017-06-12 NOTE — Telephone Encounter (Signed)
Copied from [place]. Topic: Quick Communication - Rx Refill/Question >> Jun 12, 2017  2:28 PM Antonieta Iba C wrote:   Pt's daughter [name] called in to request refill for  oxyCODONE.   [phone]   Agent: Please be advised that RX refills may take up to 3 business days. We ask that you follow-up with your pharmacy.

## 2017-06-13 IMAGING — CT CT BIOPSY
1 of 2 series · 15 of 32 positions shown, 19 images · non-contrast
Comparison: none

INDICATION: 88-year-old female with a recently diagnosed T8/T9 plasmacytoma.
Bone marrow biopsy is warranted to evaluate for marrow involvement.

[Series 2: i-spiral 5.0 b60f · axial · 0.84mm/px · z∈[-101,+4]mm · 15 of 34 slices shown, 19 images]
[im 2/34  soft-tissue]
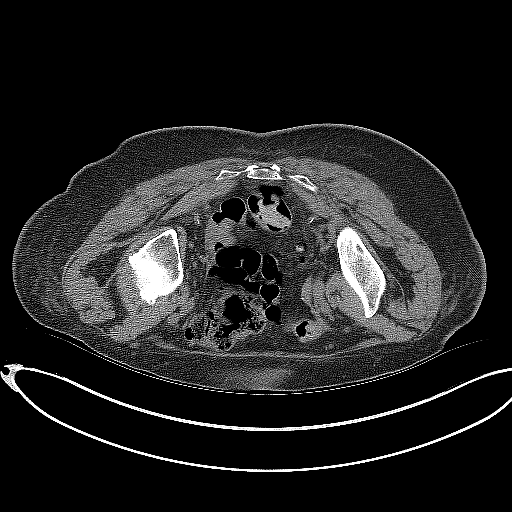
[im 2/34  bone]
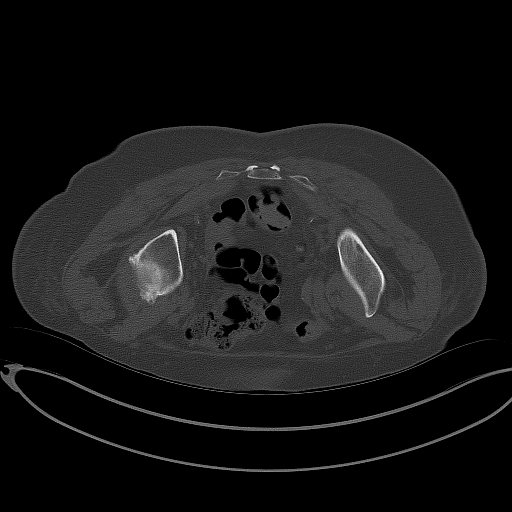
[im 4/34  soft-tissue]
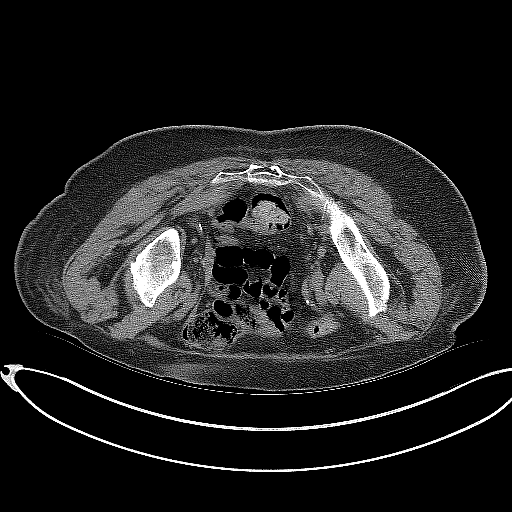
[im 7/34  soft-tissue]
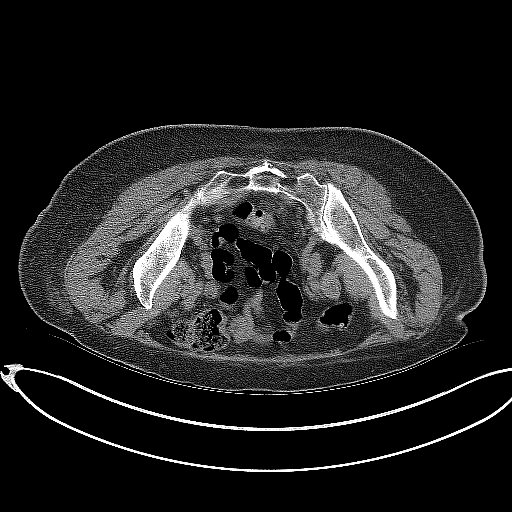
[im 9/34  soft-tissue]
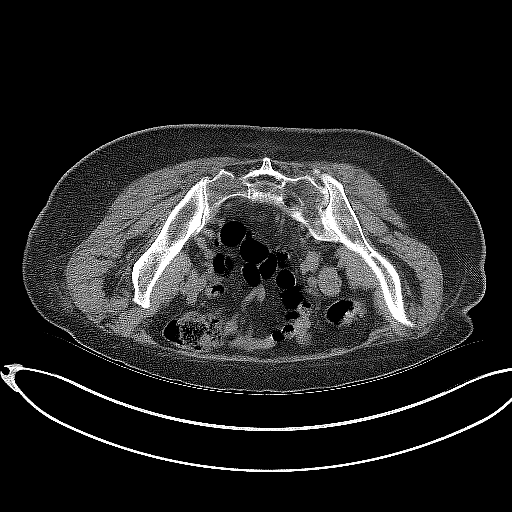
[im 12/34  soft-tissue]
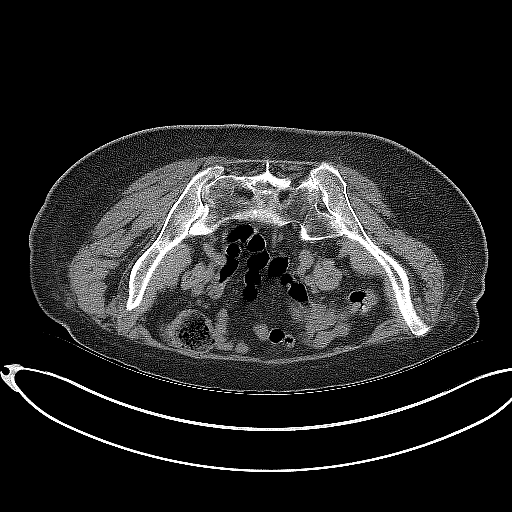
[im 14/34  soft-tissue]
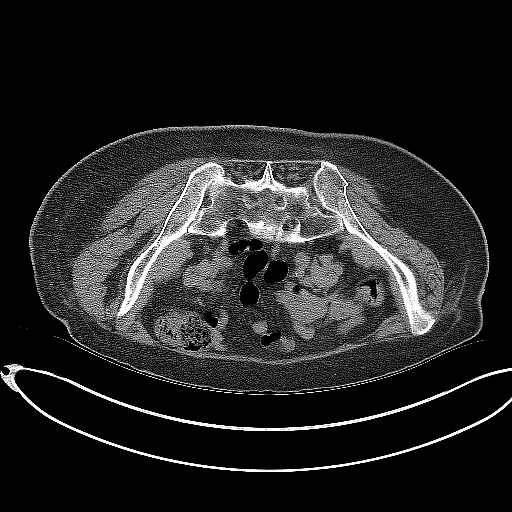
[im 17/34  soft-tissue]
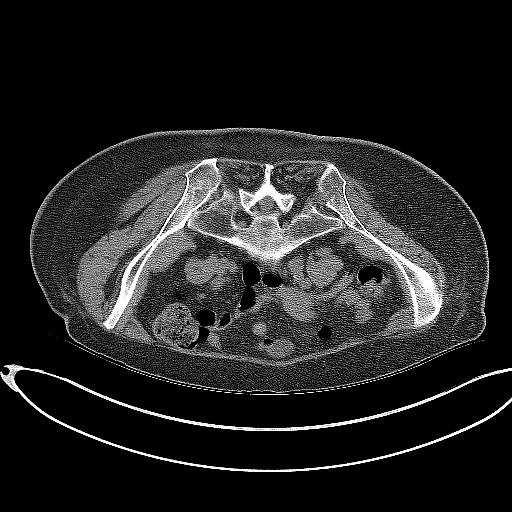
[im 20/34  soft-tissue]
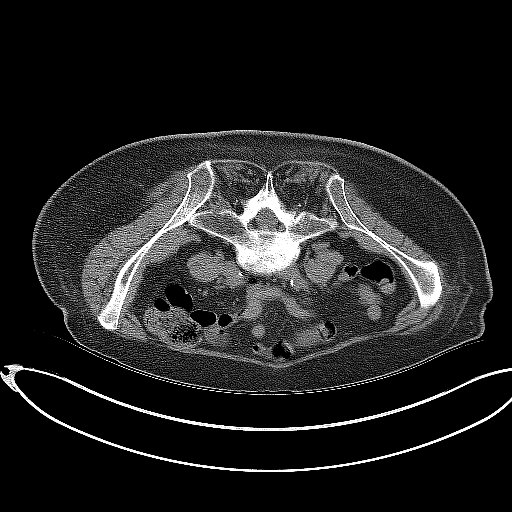
[im 22/34  soft-tissue]
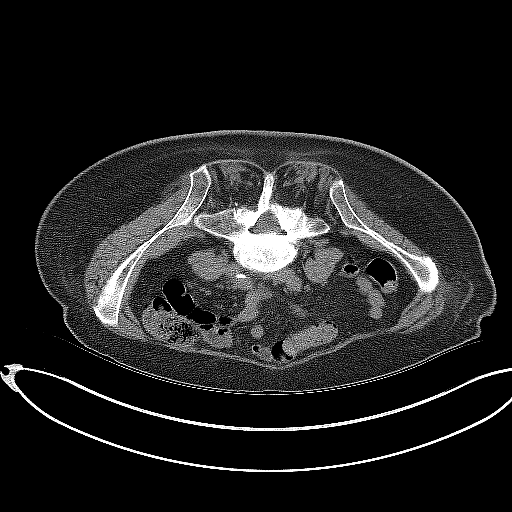
[im 22/34  bone]
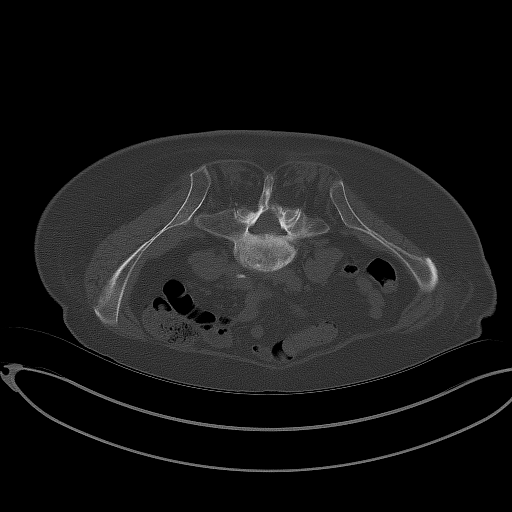
[im 25/34  soft-tissue]
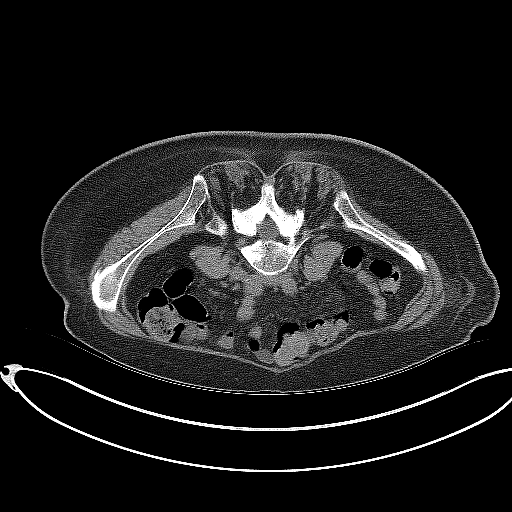
[im 27/34  soft-tissue]
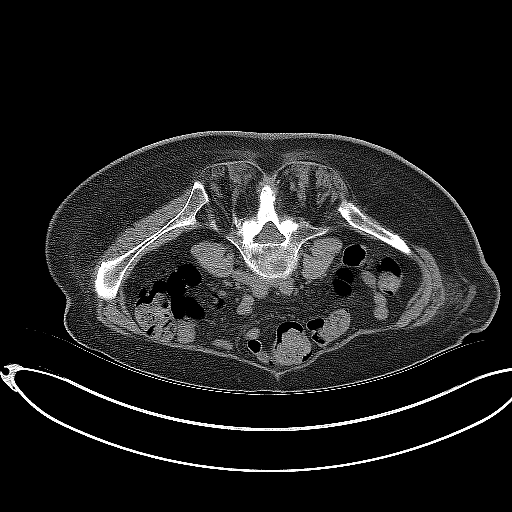
[im 28/34  lung]
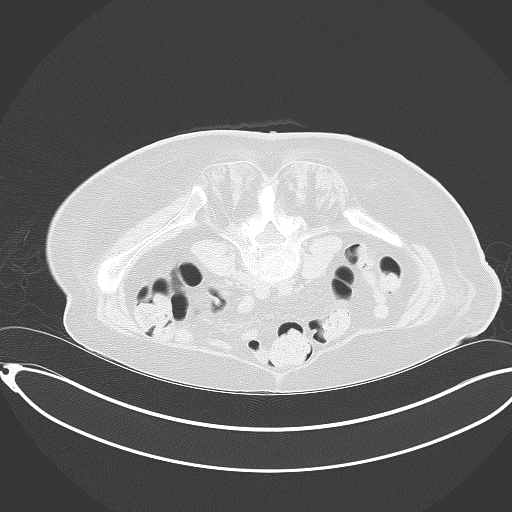
[im 30/34  soft-tissue]
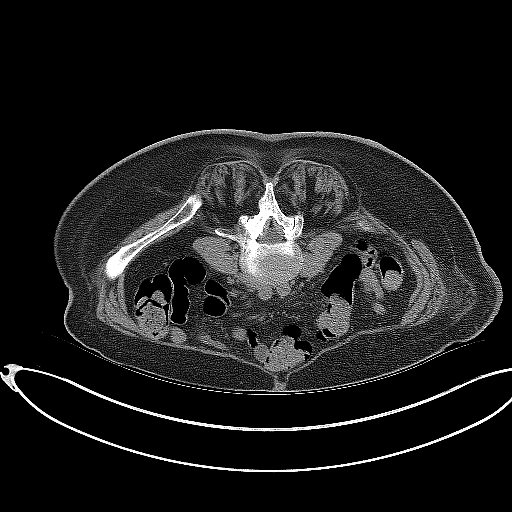
[im 30/34  lung]
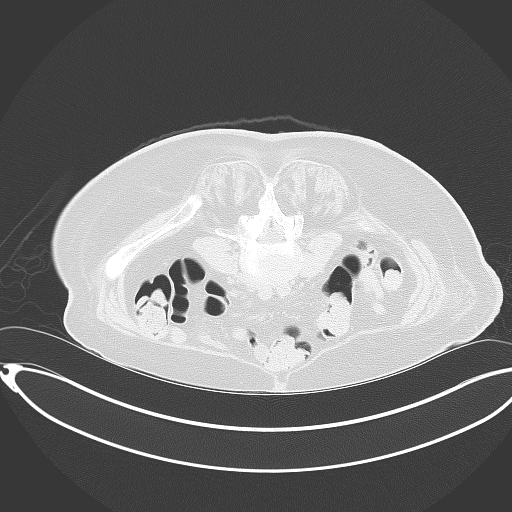
[im 31/34  lung]
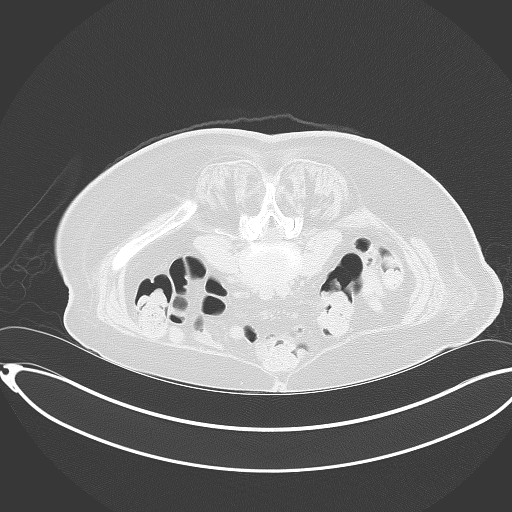
[im 32/34  soft-tissue]
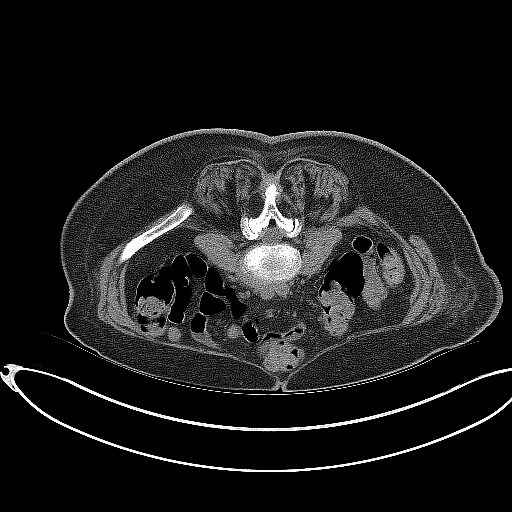
[im 32/34  lung]
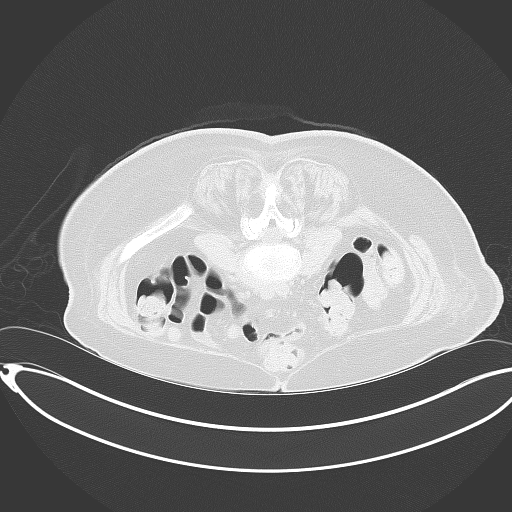

[15 of 32 positions shown; findings below may reference images not displayed]

EXAM:
CT GUIDED BONE MARROW ASPIRATION AND CORE BIOPSY

MEDICATIONS:
None.

ANESTHESIA/SEDATION:
Moderate (conscious) sedation was employed during this procedure. A
total of 1.5 milligrams versed and 75 micrograms fentanyl were
administered intravenously. The patient's level of consciousness and
vital signs were monitored continuously by radiology nursing
throughout the procedure under my direct supervision.

Total monitored sedation time: 10 minutes

FLUOROSCOPY TIME:  Fluoroscopy Time: 0 minutes 0 seconds (0 mGy).

COMPLICATIONS:
None immediate.

Estimated blood loss: <25 mL

PROCEDURE:
Informed written consent was obtained from the patient after a
thorough discussion of the procedural risks, benefits and
alternatives. All questions were addressed. Maximal Sterile Barrier
Technique was utilized including caps, mask, sterile gowns, sterile
gloves, sterile drape, hand hygiene and skin antiseptic. A timeout
was performed prior to the initiation of the procedure.

The patient was positioned prone and non-contrast localization CT
was performed of the pelvis to demonstrate the iliac marrow spaces.

Maximal barrier sterile technique utilized including caps, mask,
sterile gowns, sterile gloves, large sterile drape, hand hygiene,
and betadine prep.

Under sterile conditions and local anesthesia, an 11 gauge coaxial
bone biopsy needle was advanced into the right iliac marrow space.
Needle position was confirmed with CT imaging. Initially, bone
marrow aspiration was performed. Next, the 11 gauge outer cannula
was utilized to obtain a right iliac bone marrow core biopsy. Needle
was removed. Hemostasis was obtained with compression. The patient
tolerated the procedure well. Samples were prepared with the
cytotechnologist.
IMPRESSION: Technically successful CT-guided bone marrow biopsy of the right
iliac bone.

## 2017-06-13 NOTE — Telephone Encounter (Signed)
Pt's daughter called in to request status of refill and was advised that it went to [facility] on [address] via [facility].

## 2017-07-19 IMAGING — DX DG CHEST 2V
2 series · 2 of 2 positions shown · non-contrast
Comparison: CT chest abdomen and pelvis 04/02/2016

CLINICAL DATA: Shortness of breath with dyspnea on exertion. Recent
diagnosis of plasmacytoma T8-T9 vertebral bodies.

EXAM:
CHEST  2 VIEW

[chest lat]
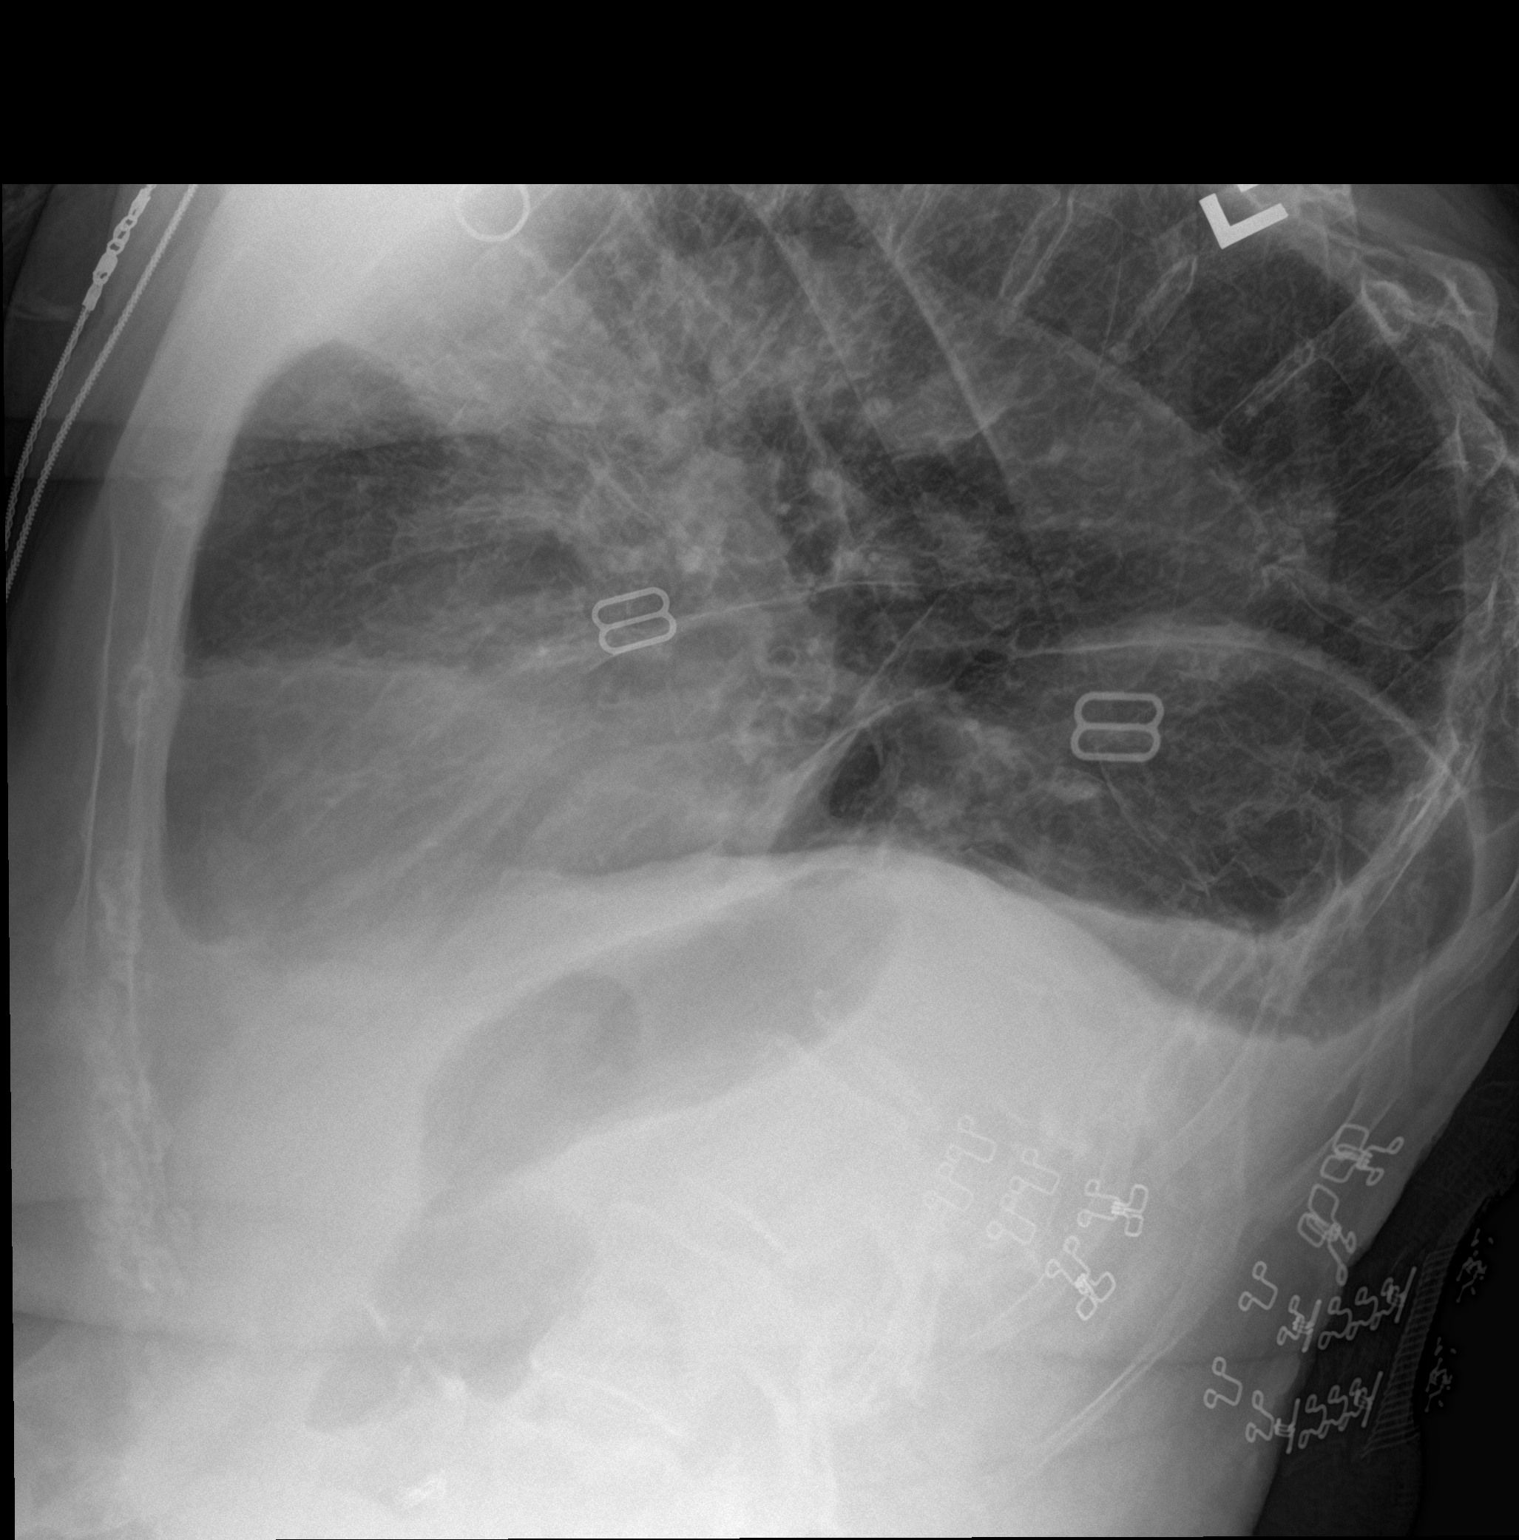

[chest pa]
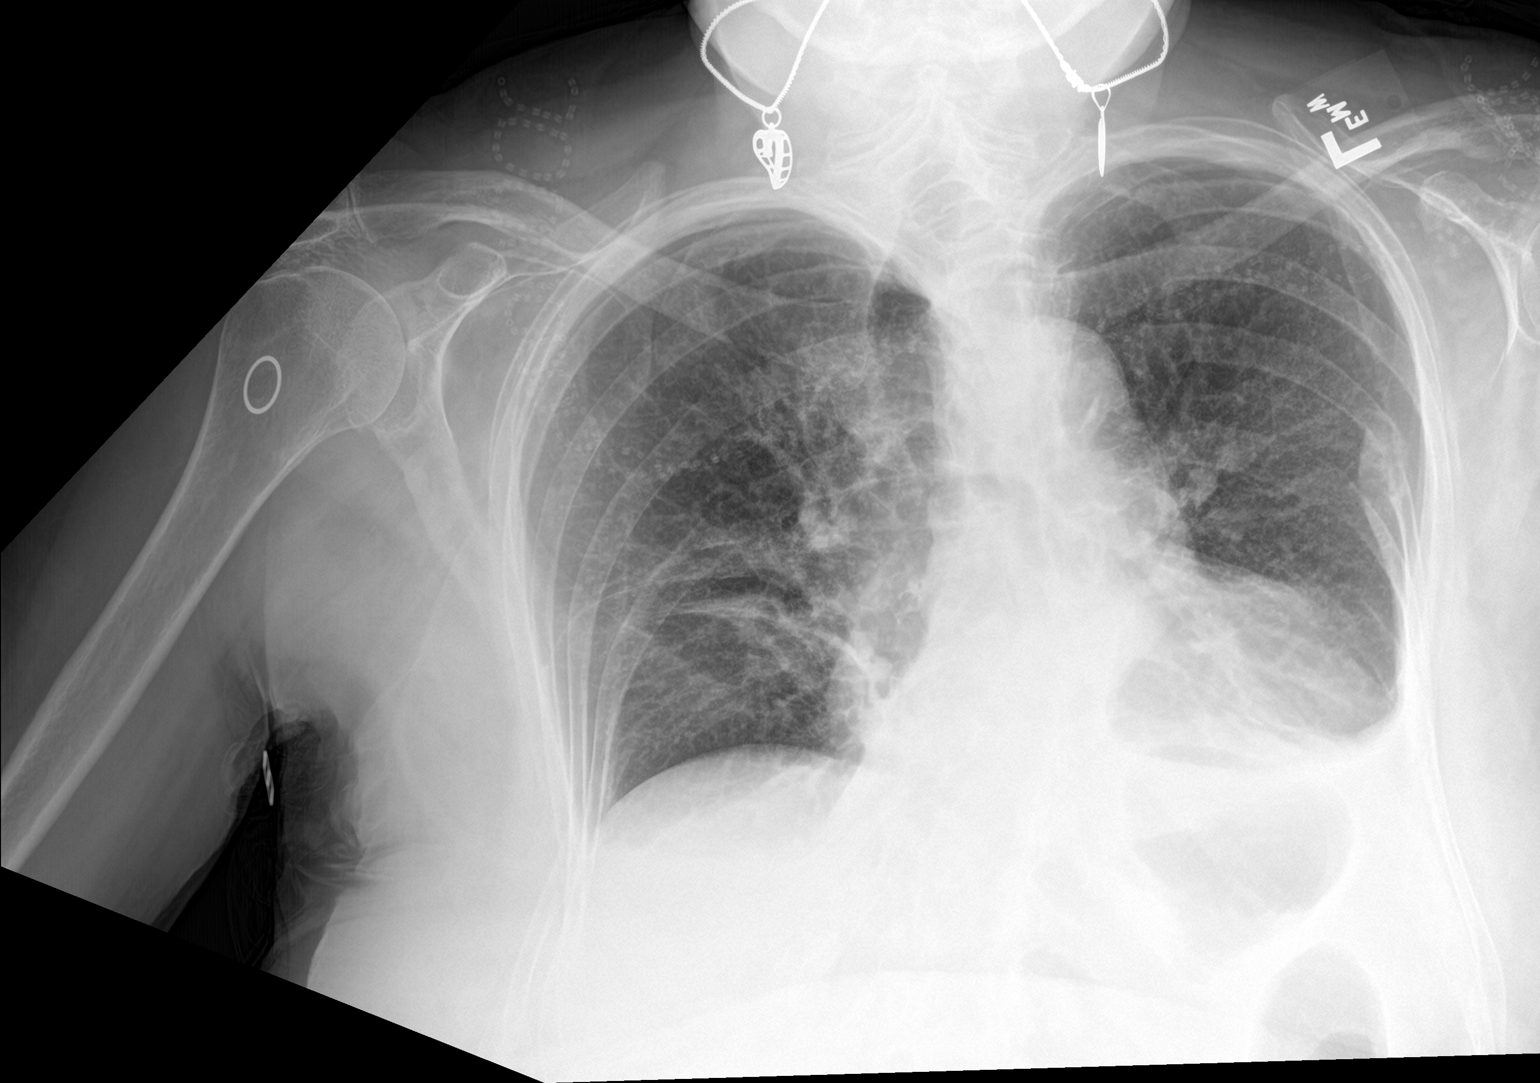

[2 of 2 positions shown; findings below may reference images not displayed]

FINDINGS: Mild cardiomegaly. The patient is markedly kyphotic, and due to
decreased bony mineralization, individual thoracic spine vertebral
bodies are difficult to visualize in the lateral projection. The
patient's kyphosis is centered at the abnormal vertebral bodies
described on recent CT chest and abdomen pelvis, at the T8-T9 level,
where soft tissue masses were seen.

On the chest radiograph, there is a lesion with partial destruction
and sclerosis associated with the posterior left seventh rib. On my
review the recent CT chest abdomen pelvis, there is a soft tissue
mass with bony destruction involving the left posterior seventh rib,
on the CT dated 04/02/2016.

There is left basilar atelectasis and a small left pleural effusion.
Right lung appears clear. There is artifact from the patient's bra,
projecting over the upper chest.

No visible pneumothorax.
IMPRESSION: Small left pleural effusion and left basilar atelectasis.

Marked kyphosis, centered at the T8-T9 level with the patient has
known soft tissue masses as described on chest abdomen pelvis CT
dated 04/02/2016.

Left posterior seventh rib lesion with rib destruction corresponds
to a soft tissue mass associated with the left rib seen in
retrospect on the recent CT chest abdomen pelvis.

## 2017-07-26 ENCOUNTER — Encounter: Payer: Self-pay | Admitting: Internal Medicine

## 2017-07-26 ENCOUNTER — Ambulatory Visit (INDEPENDENT_AMBULATORY_CARE_PROVIDER_SITE_OTHER): Payer: Medicare Other | Admitting: Internal Medicine

## 2017-07-26 VITALS — BP 148/63 | HR 87 | Temp 97.8°F | Resp 16 | Ht 66.0 in | Wt 128.0 lb

## 2017-07-26 DIAGNOSIS — C9000 Multiple myeloma not having achieved remission: Secondary | ICD-10-CM

## 2017-07-26 DIAGNOSIS — I10 Essential (primary) hypertension: Secondary | ICD-10-CM | POA: Diagnosis not present

## 2017-07-26 DIAGNOSIS — Z79899 Other long term (current) drug therapy: Secondary | ICD-10-CM | POA: Diagnosis not present

## 2017-07-26 DIAGNOSIS — L729 Follicular cyst of the skin and subcutaneous tissue, unspecified: Secondary | ICD-10-CM

## 2017-07-26 DIAGNOSIS — M549 Dorsalgia, unspecified: Secondary | ICD-10-CM

## 2017-07-26 MED ORDER — OXYCODONE HCL 5 MG PO TABS: 5.0000 mg | ORAL_TABLET | Freq: Three times a day (TID) | ORAL | 0 refills | Status: DC | PRN

## 2017-07-26 NOTE — Progress Notes (Signed)
Subjective:    Patient ID: Mikayla Marks, female    DOB: [birth year], 84 y.o.   MRN: [id number]  DOS:  07/26/2017 Type of visit - description : rov, here with her daughter Interval history: Multiple myeloma: Since the last visit, has not seen oncology.  Seems stable Pain management: Taking zanaflex as needed only, oxycodone usually twice daily, occasionally 3 times a day.  Not taking gabapentin. Daughter reports a lump at the back, left side, noted a few days ago but she is not sure (may have been there for longer). Patient denies any pain.  Review of Systems Denies fever chills No chest pain or difficulty breathing No nausea, vomiting. No  blood in the stools. No headaches   Past Medical History:  Diagnosis Date  . Carotid artery stenosis   . Chronic Marks disease   . Hyperlipidemia   . HYPERTENSION   . Osteoporosis   . Paranoid schizophrenia ([place])   . SCC (squamous cell carcinoma)    in situ R pretibial area    Past Surgical History:  Procedure Laterality Date  . BREAST BIOPSY     benign tumor  . CATARACT EXTRACTION    . CHOLECYSTECTOMY    . IR GENERIC HISTORICAL  04/04/2016   IR FLUORO GUIDED NEEDLE PLC ASPIRATION/INJECTION LOC 04/04/2016 Mikayla Bras, MD [facility]  . LASER ABLATION Left    left LE   . LEG SURGERY Right 10/2012   Ruptured Veins  . PARTIAL HYSTERECTOMY     due to bleeding  . TONSILLECTOMY      Social History   Socioeconomic History  . Marital status: Single    Spouse name: Not on file  . Number of children: 3  . Years of education: Not on file  . Highest education level: Not on file  Social Needs  . Financial resource strain: Not on file  . Food insecurity - worry: Not on file  . Food insecurity - inability: Not on file  . Transportation needs - medical: Not on file  . Transportation needs - non-medical: Not on file  Occupational History  . Occupation: not working  Tobacco Use  . Smoking status: Never Smoker  . Smokeless  tobacco: Never Used  Substance and Sexual Activity  . Alcohol use: No    Alcohol/week: 0.0 oz  . Drug use: No  . Sexual activity: No  Other Topics Concern  . Not on file  Social History Narrative   Daughter lives with her Mikayla Marks) ; pt is  widow   Brother Mikayla Marks in [place], lives 5 min away [phone]         Allergies as of 07/26/2017      Reactions   Other Other (See Comments)   5FU cream - stroke like symptoms   Bacitracin-polymyxin B Other (See Comments)   Reaction unknown.    Penicillins Rash      Medication List        Accurate as of 07/26/17 11:12 PM. Always use your most recent med list.          CALTRATE 600 PO Take 1 tablet by mouth daily.   cycloSPORINE 0.05 % ophthalmic emulsion Commonly known as:  RESTASIS Place 1 drop into both eyes 2 (two) times daily.   multivitamin with minerals tablet Take 1 tablet by mouth 4 (four) times a week.   oxyCODONE 5 MG immediate release tablet Commonly known as:  Oxy IR/ROXICODONE Take 1 tablet (5 mg total) by mouth 3 (three)  times daily as needed for moderate pain.   SALONPAS EX Apply 1 patch topically as needed (for pain).   senna 8.6 MG Tabs tablet Commonly known as:  SENOKOT Take 1 tablet (8.6 mg total) by mouth 2 (two) times daily.   tiZANidine 4 MG tablet Commonly known as:  ZANAFLEX Take 1 tablet (4 mg total) every 6 (six) hours as needed by mouth for muscle spasms.          Objective:   Physical Exam BP (!) 148/63 (BP Location: Right Arm, Patient Position: Sitting, Cuff Size: Small)   Pulse 87   Temp 97.8 F (36.6 C) (Oral)   Resp 16   Ht _0  (1.676 m)   Wt 128 lb (58.1 kg)   SpO2 99%   BMI 20.66 kg/m  General:   Well developed, well nourished . NAD.  HEENT:  Normocephalic . Face symmetric, atraumatic Lungs:  Slightly decreased breath sounds but otherwise clear Normal respiratory effort, no intercostal retractions, no accessory muscle use. Heart: RRR,  no murmur.  No pretibial edema  bilaterally  Skin: Has a 6 cm soft, mobile, not tender or warm mass a left upper back, see picture. MSK: Has a hard bony induration at the thoracic spine, see picture, similar to previous months. Neurologic:  alert & oriented X3.  Speech normal, gait assisted by a rolling walker.  Seems at baseline. Psych--  Patient has a history of schizophrenia, stating that she has been possible by radiation at home. Cooperative with normal attention span and concentration.  No anxious or depressed appearing.            Assessment & Plan:    Assessment HTN Hyperlipidemia Schizophrenia Osteoporosis : Per DEXA 2010, DEXA 2013 with Dr. Amalia Hailey: Osteopenia. 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 Vitamin D deficiency Varicose pain, lower extremity edema: [place] (-) DVT 08-2014 SCC, R pretibial  PLAN  HTN: on no meds, BP satisfactory  Multiple myeloma: Since the last office  has not seen oncology, seems to be doing well, will check a CMP and CBC to screen for complications. Pain management: Currently well controlled on oxycodone, mostly B.I.D. sometimes TID; self stopped gabapentin, felt that it was not helping.  No problems with constipation.  UDS and contract today Cyst:  Daughter noted a lump at the left back, exam c/w a cyst, likely benign , lipoma? Does not seem to  be bony related, rx observation and to call if that area ever gets hot, tender or red.  May need I&D. They verbalized understanding. Had a flu shot   RTC 6 months

## 2017-07-26 NOTE — Patient Instructions (Addendum)
GO TO THE LAB : Get the blood work.  Also provide a urine sample   GO TO THE FRONT DESK Schedule your next appointment for a checkup in 5-6 months   Call if the cyst gets suddenly bigger, red, tender or warm.

## 2017-07-26 NOTE — Progress Notes (Signed)
Pre visit review using our clinic review tool, if applicable. No additional management support is needed unless otherwise documented below in the visit note. 

## 2017-07-26 NOTE — Assessment & Plan Note (Addendum)
HTN: on no meds, BP satisfactory  Multiple myeloma: Since the last office  has not seen oncology, seems to be doing well, will check a CMP and CBC to screen for complications. Pain management: Currently well controlled on oxycodone, mostly B.I.D. sometimes TID; self stopped gabapentin, felt that it was not helping.  No problems with constipation.  UDS and contract today Cyst:  Daughter noted a lump at the left back, exam c/w a cyst, likely benign , lipoma? Does not seem to  be bony related, rx observation and to call if that area ever gets hot, tender or red.  May need I&D. They verbalized understanding. Had a flu shot   RTC 6 months

## 2017-07-27 LAB — CBC WITH DIFFERENTIAL/PLATELET
Basophils Absolute: 0.1 10*3/uL (ref 0.0–0.1)
Basophils Relative: 1.2 % (ref 0.0–3.0)
EOS ABS: 0.3 10*3/uL (ref 0.0–0.7)
Eosinophils Relative: 5.3 % — ABNORMAL HIGH (ref 0.0–5.0)
HEMATOCRIT: 39.8 % (ref 36.0–46.0)
Hemoglobin: 13.3 g/dL (ref 12.0–15.0)
LYMPHS ABS: 1.1 10*3/uL (ref 0.7–4.0)
LYMPHS PCT: 22.5 % (ref 12.0–46.0)
MCHC: 33.4 g/dL (ref 30.0–36.0)
MCV: 97.7 fl (ref 78.0–100.0)
Monocytes Absolute: 0.5 10*3/uL (ref 0.1–1.0)
Monocytes Relative: 9.4 % (ref 3.0–12.0)
NEUTROS ABS: 3 10*3/uL (ref 1.4–7.7)
NEUTROS PCT: 61.6 % (ref 43.0–77.0)
PLATELETS: 226 10*3/uL (ref 150.0–400.0)
RBC: 4.08 Mil/uL (ref 3.87–5.11)
RDW: 13.3 % (ref 11.5–15.5)
WBC: 4.9 10*3/uL (ref 4.0–10.5)

## 2017-07-27 LAB — COMPREHENSIVE METABOLIC PANEL
ALK PHOS: 66 U/L (ref 39–117)
ALT: 12 U/L (ref 0–35)
AST: 19 U/L (ref 0–37)
Albumin: 4.3 g/dL (ref 3.5–5.2)
BILIRUBIN TOTAL: 0.5 mg/dL (ref 0.2–1.2)
BUN: 23 mg/dL (ref 6–23)
CALCIUM: 9.4 mg/dL (ref 8.4–10.5)
CO2: 28 meq/L (ref 19–32)
CREATININE: 0.82 mg/dL (ref 0.40–1.20)
Chloride: 104 mEq/L (ref 96–112)
GFR: 69.97 mL/min (ref >60.00)
Glucose, Bld: 86 mg/dL (ref 70–99)
Potassium: 4.2 mEq/L (ref 3.5–5.1)
Sodium: 139 mEq/L (ref 135–145)
TOTAL PROTEIN: 7.2 g/dL (ref 6.0–8.3)

## 2017-07-29 LAB — PAIN MGMT, PROFILE 8 W/CONF, U
6 Acetylmorphine: NEGATIVE ng/mL (ref <10)
AMPHETAMINES: NEGATIVE ng/mL (ref <500)
Alcohol Metabolites: NEGATIVE ng/mL (ref <500)
BUPRENORPHINE, URINE: NEGATIVE ng/mL (ref <5)
Benzodiazepines: NEGATIVE ng/mL (ref <100)
CODEINE: NEGATIVE ng/mL (ref <50)
Cocaine Metabolite: NEGATIVE ng/mL (ref <150)
Creatinine: 122.4 mg/dL
HYDROCODONE: NEGATIVE ng/mL (ref <50)
Hydromorphone: NEGATIVE ng/mL (ref <50)
MDMA: NEGATIVE ng/mL (ref <500)
Marijuana Metabolite: NEGATIVE ng/mL (ref <20)
Morphine: NEGATIVE ng/mL (ref <50)
NORHYDROCODONE: NEGATIVE ng/mL (ref <50)
Noroxycodone: 2165 ng/mL — ABNORMAL HIGH (ref <50)
OPIATES: NEGATIVE ng/mL (ref <100)
OXYMORPHONE: 2558 ng/mL — AB (ref <50)
Oxidant: NEGATIVE ug/mL (ref <200)
Oxycodone: 2485 ng/mL — ABNORMAL HIGH (ref <50)
Oxycodone: POSITIVE ng/mL — AB (ref <100)
PH: 5.82 (ref 4.5–9.0)

## 2017-08-09 ENCOUNTER — Telehealth: Payer: Self-pay | Admitting: Internal Medicine

## 2017-08-09 DIAGNOSIS — H1013 Acute atopic conjunctivitis, bilateral: Secondary | ICD-10-CM | POA: Diagnosis not present

## 2017-08-09 NOTE — Telephone Encounter (Signed)
LMOM informing Pt and her daughter of lab results. Instructed results have been printed and placed at front desk for pick up at her convenience.

## 2017-08-09 NOTE — Telephone Encounter (Signed)
Agree, thank you. Send a letter Leda Gauze, your blood work came back very good.  Good results

## 2017-08-09 NOTE — Telephone Encounter (Addendum)
Copied from [place]. Topic: Quick Communication - See Telephone Encounter >> Aug 09, 2017 11:16 AM Ivar Drape wrote: CRM for notification. See Telephone encounter for:  08/09/17. [place] patient's daughter, would like a call back with the results of the labs the patient had drawn on 07/26/17.  She would also like copies of the labs.  They will come into the office to pick them up when they are ready. Please call the daughter at [phone] if you have any questions.

## 2017-08-09 NOTE — Telephone Encounter (Signed)
Please advise regarding labs from 07/26/2017- looks like UDS was resulted only.

## 2017-08-23 ENCOUNTER — Telehealth: Payer: Self-pay | Admitting: Internal Medicine

## 2017-08-23 MED ORDER — OXYCODONE HCL 5 MG PO TABS: 5.0000 mg | ORAL_TABLET | Freq: Three times a day (TID) | ORAL | 0 refills | Status: DC | PRN

## 2017-08-23 NOTE — Telephone Encounter (Signed)
Pt is requesting refill on oxycodone.   Last OV: 07/26/2017  Last Fill: 07/26/2017 #70 and 0RF UDS: 07/26/2017 Low risk  Please advise.

## 2017-08-23 NOTE — Telephone Encounter (Signed)
Copied from [place]. Topic: Quick Communication - Rx Refill/Question >> Aug 23, 2017  9:18 AM Margot Ables wrote: Medication: oxycodone - 2-3/day - pt will be out soon she is very low according to daughter - requesting call back and wants to pick up RX today  Has the patient contacted their pharmacy? No. Pt daughter refused Preferred Pharmacy (with phone number or street name): [facility] - [place], [place] - [address] AT [place] [phone] (Phone) [phone] (Fax)

## 2017-08-23 NOTE — Telephone Encounter (Signed)
Oxycodone LOV: 07/26/17 PCP: Kathlene November MD Pharmacy:[facility] [phone] [facility]

## 2017-08-23 NOTE — Telephone Encounter (Signed)
rx sent

## 2017-08-24 NOTE — Telephone Encounter (Signed)
ICD-10 codes given.

## 2017-08-24 NOTE — Telephone Encounter (Addendum)
Simone at [employer] for a IC-10 code for this Rx Oxycodone.  Please call back.  [facility] - [place], [place] - [address] AT [place] [phone] (Phone) [phone] (Fax)

## 2017-09-27 ENCOUNTER — Telehealth: Payer: Self-pay | Admitting: Internal Medicine

## 2017-09-27 MED ORDER — OXYCODONE HCL 5 MG PO TABS: 5.0000 mg | ORAL_TABLET | Freq: Three times a day (TID) | ORAL | 0 refills | Status: DC | PRN

## 2017-09-27 NOTE — Telephone Encounter (Signed)
Copied from [place] [id number]. Topic: Quick Communication - Rx Refill/Question >> Sep 27, 2017 11:36 AM Scherrie Gerlach wrote: Medication: oxyCODONE (OXY IR/ROXICODONE) 5 MG immediate release tablet Has the patient contacted their pharmacy? no  [facility] - [place], [place] - [address] [place] [address] [phone] (Phone) [phone] (Fax)

## 2017-09-27 NOTE — Telephone Encounter (Signed)
Pt requesting refill on Oxycodone.   Last OV: 07/26/2017 Last Fill: 08/23/2017 #70 and 0RF UDS: 07/26/2017 Low risk  NCCR printed- no discrepancies noted- sent for scanning.   Please advise.

## 2017-09-27 NOTE — Telephone Encounter (Signed)
sent 

## 2017-11-02 ENCOUNTER — Telehealth: Payer: Self-pay | Admitting: Internal Medicine

## 2017-11-02 NOTE — Telephone Encounter (Signed)
Copied from [place] [id number]. Topic: Quick Communication - Rx Refill/Question >> Nov 02, 2017 12:05 PM Selinda Flavin B, NT wrote: Medication: oxyCODONE (OXY IR/ROXICODONE) 5 MG immediate release tablet Has the patient contacted their pharmacy? Yes.   (Agent: If no, request that the patient contact the pharmacy for the refill.) Preferred Pharmacy (with phone number or street name): [id number] - [place], [place] - [address] Agent: Please be advised that RX refills may take up to 3 business days. We ask that you follow-up with your pharmacy.

## 2017-11-02 NOTE — Telephone Encounter (Signed)
Pt is requesting refill on oxycodone.   Last OV: 07/26/2017 Last Fill: 09/27/2017 #70 and 5DD UDS: 07/26/2017 Low risk  NCCR in media from 09/27/2017- no discrepancies  Please advise.

## 2017-11-03 MED ORDER — OXYCODONE HCL 5 MG PO TABS: 5.0000 mg | ORAL_TABLET | Freq: Three times a day (TID) | ORAL | 0 refills | Status: DC | PRN

## 2017-11-03 NOTE — Telephone Encounter (Signed)
Sent!

## 2017-12-04 ENCOUNTER — Telehealth: Payer: Self-pay | Admitting: Internal Medicine

## 2017-12-04 NOTE — Telephone Encounter (Signed)
Copied from [place] [phone]. Topic: Quick Communication - Rx Refill/Question >> Dec 04, 2017 10:10 AM Lennox Solders wrote: Medication: oxycodone Has the patient contacted their pharmacy? no (Agent: If no, request that the patient contact the pharmacy for the refill.) (Agent: If yes, when and what did the pharmacy advise?)  Preferred Pharmacy (with phone number or street name): [facility] [address] Agent: Please be advised that RX refills may take up to 3 business days. We ask that you follow-up with your pharmacy.

## 2017-12-07 MED ORDER — OXYCODONE HCL 5 MG PO TABS: 5.0000 mg | ORAL_TABLET | Freq: Three times a day (TID) | ORAL | 0 refills | Status: DC | PRN

## 2017-12-07 NOTE — Addendum Note (Signed)
Addended by: Kathlene November E on: 12/07/2017 12:36 PM   Modules accepted: Orders

## 2017-12-07 NOTE — Telephone Encounter (Signed)
Pt is requesting refill on oxycodone.   Last OV: 07/26/2017 Last Fill: 11/03/2017 #70 and 0RF UDS: 07/26/2017 Low risk  NCCR in media from 09/27/2017- no discrepancies noted   Please advise.

## 2017-12-07 NOTE — Addendum Note (Signed)
Addended byDamita Dunnings D on: 12/07/2017 12:34 PM   Modules accepted: Orders

## 2017-12-07 NOTE — Telephone Encounter (Signed)
sent 

## 2017-12-07 NOTE — Telephone Encounter (Signed)
Daughter checking status, will run out, need to know something today. Please advise Call back [phone]

## 2018-01-08 ENCOUNTER — Telehealth: Payer: Self-pay | Admitting: Internal Medicine

## 2018-01-08 MED ORDER — OXYCODONE HCL 5 MG PO TABS: 5.0000 mg | ORAL_TABLET | Freq: Three times a day (TID) | ORAL | 0 refills | Status: DC | PRN

## 2018-01-08 NOTE — Telephone Encounter (Signed)
Pt's daughter said mother has been bed ridden and find it difficult for her to get here for visit. She said she is not feeling well and is in pain all the time. Requesting for provider to refill for a couple of months. Please call patient if possible.

## 2018-01-08 NOTE — Telephone Encounter (Signed)
Please call next month for next refill

## 2018-01-08 NOTE — Telephone Encounter (Signed)
Pt is requesting refill on oxycodone.   Last OV: 07/26/2017 Last Fill: 12/07/2017 #70 and 0RF UDS: 07/26/2017 Low risk  NCCR in media from 10/24/2017- no discrepancies noted  Please advise.

## 2018-01-08 NOTE — Telephone Encounter (Signed)
Please call Pt and/or [name], Pt due for OV before next refill. Thank you.

## 2018-01-08 NOTE — Telephone Encounter (Signed)
Please arrange a ROV RX sent

## 2018-01-08 NOTE — Telephone Encounter (Signed)
Copied from [place] [id number]. Topic: Quick Communication - Rx Refill/Question >> Jan 08, 2018  9:26 AM Margot Ables wrote: Medication: oxyCODONE - pt will be out in a few days - call in from daughter  Has the patient contacted their pharmacy? No - controlled Preferred Pharmacy (with phone number or street name): [facility] - [place], [place] - [address] AT [place] [phone] (Phone) [phone] (Fax)

## 2018-01-18 NOTE — Telephone Encounter (Signed)
Spoke with daugher [name] and informed her the below, daughter stated will call to get pt's next refill done for next month when close by for her next refill.

## 2018-02-01 ENCOUNTER — Other Ambulatory Visit: Payer: Self-pay | Admitting: Internal Medicine

## 2018-02-01 NOTE — Telephone Encounter (Signed)
Refill of Oxycodone  LOV 07/26/17 Dr. Larose Kells  [facility] 01/08/18  #70  0 refills  [facility] [address]

## 2018-02-01 NOTE — Telephone Encounter (Signed)
Copied from [place] [phone]. Topic: Quick Communication - Rx Refill/Question >> Feb 01, 2018  4:21 PM Bea Graff, NT wrote: Medication: oxyCODONE (OXY IR/ROXICODONE) 5 MG immediate release tablet  Has the patient contacted their pharmacy? Yes.   (Agent: If no, request that the patient contact the pharmacy for the refill.) (Agent: If yes, when and what did the pharmacy advise?)  Preferred Pharmacy (with phone number or street name): [id number] - [place], [place] - [address] AT [place] [phone] (Phone) [phone] (Fax)    Agent: Please be advised that RX refills may take up to 3 business days. We ask that you follow-up with your pharmacy.

## 2018-02-02 MED ORDER — OXYCODONE HCL 5 MG PO TABS: 5.0000 mg | ORAL_TABLET | Freq: Three times a day (TID) | ORAL | 0 refills | Status: DC | PRN

## 2018-02-02 NOTE — Telephone Encounter (Signed)
Pt last seen 07/26/17 UDS 07/26/17 Reviewed NCCSR today- ok to refill

## 2018-03-05 ENCOUNTER — Telehealth: Payer: Self-pay | Admitting: Internal Medicine

## 2018-03-05 NOTE — Telephone Encounter (Signed)
Copied from [place] [phone]. Topic: Quick Communication - See Telephone Encounter >> Mar 05, 2018 10:05 AM Blase Mess A wrote: CRM for notification. See Telephone encounter for: 03/05/18. Patient daughter [name] called to see if Dr Larose Kells would write a prescription for a hospital bed for the patient.  Patients daughter would like some advise on where to get the bed as well. Please advise. [name] [phone]

## 2018-03-05 NOTE — Telephone Encounter (Signed)
Due to Medicare guidelines will need to be seen in office for documentation as to why she needs hospital bed. Last OV 06/2017- must be seen within the last 90 days. Can discuss where she can get hospital bed at time of OV.

## 2018-03-05 NOTE — Telephone Encounter (Signed)
Called pt and spoke with daughter [name]), scheduled pt's appt for Wednesday 03-07-2018 at 1:40, daughter stated pt is having some difficulties coming out the house but will do best to bring pt to her appt. (pt has condition that makes it difficult to come to her appt)

## 2018-03-07 ENCOUNTER — Ambulatory Visit: Payer: Medicare Other | Admitting: Internal Medicine

## 2018-03-19 ENCOUNTER — Telehealth: Payer: Self-pay | Admitting: Internal Medicine

## 2018-03-19 NOTE — Telephone Encounter (Signed)
Oxycodone 5 mg refill Last Refill:02/02/18 # 70  0 refills Last OV: 07/26/17 PCP: [facility] [phone]

## 2018-03-19 NOTE — Telephone Encounter (Signed)
Copied from [place] [id number]. Topic: Quick Communication - See Telephone Encounter >> Mar 19, 2018 11:45 AM Conception Chancy, NT wrote: CRM for notification. See Telephone encounter for: 03/19/18.  Patient is needing a refill on oxyCODONE (OXY IR/ROXICODONE) 5 MG immediate release tablet.  [id number] - [place], [place] - [address] [place] [address] Phone: [phone] Fax: [phone]

## 2018-03-19 NOTE — Telephone Encounter (Signed)
Please inform Pt and/or [name] that Pt will need visit in office before further refills. Last OV 06/2017.

## 2018-03-19 NOTE — Telephone Encounter (Signed)
Please schedule a office visit  

## 2018-03-20 MED ORDER — OXYCODONE HCL 5 MG PO TABS: 5.0000 mg | ORAL_TABLET | Freq: Three times a day (TID) | ORAL | 0 refills | Status: DC | PRN

## 2018-03-20 NOTE — Telephone Encounter (Signed)
please call the patient's daughter. While I am sympathetic and  like to help with the patient, it is my responsibility to see the patients  I prescribe narcotics to. I'll send the prescription. She needs to arrange a visit with me. If  unable to, needs to seek a service such as doctors that visit patients at home.

## 2018-03-20 NOTE — Telephone Encounter (Signed)
Pt daughter [name] is calling stating that she was waiting on a respond from Dr Larose Kells about her Mother medicine Pt's daughter stated that the pt is elderly and essentially bedridden and its hard for her to get her up please call at [phone] or [phone] pt will run out by Thursday

## 2018-03-20 NOTE — Telephone Encounter (Signed)
Called and spoke to pt's daughter. Informed her that Dr. Larose Kells wanted to see the pt before prescribing any further refills. Pt's daughter stated that the pt is elderly and essentially bedridden. I told the pt's daughter I would put a note in with Dr. Larose Kells to see how he wanted to proceed.  Please advise.

## 2018-03-20 NOTE — Addendum Note (Signed)
Addended by: Kathlene November E on: 03/20/2018 03:27 PM   Modules accepted: Orders

## 2018-03-21 NOTE — Telephone Encounter (Signed)
Author phoned daughter to update on narcotic refill and need to be seen in the office per Dr. Larose Kells. Daughter discussed with pt., and daughter sounded more fatigued over the phone than the pt. "She'll try to make it", daughter states, "it's very hard for her". Author stated that Dr. Larose Kells understands but they need to do their best to be seen in the office. Appointment made for 10/2 at 220PM. Dr. Larose Kells made aware.

## 2018-03-28 ENCOUNTER — Encounter (HOSPITAL_BASED_OUTPATIENT_CLINIC_OR_DEPARTMENT_OTHER): Payer: Self-pay

## 2018-03-28 ENCOUNTER — Encounter (HOSPITAL_BASED_OUTPATIENT_CLINIC_OR_DEPARTMENT_OTHER): Payer: Self-pay | Admitting: *Deleted

## 2018-03-28 ENCOUNTER — Ambulatory Visit: Payer: Self-pay | Admitting: Internal Medicine

## 2018-03-28 ENCOUNTER — Emergency Department (HOSPITAL_BASED_OUTPATIENT_CLINIC_OR_DEPARTMENT_OTHER): Payer: Medicare Other

## 2018-03-28 ENCOUNTER — Telehealth: Payer: Self-pay

## 2018-03-28 ENCOUNTER — Emergency Department (HOSPITAL_BASED_OUTPATIENT_CLINIC_OR_DEPARTMENT_OTHER)
Admission: EM | Admit: 2018-03-28 | Discharge: 2018-03-28 | Disposition: A | Discharge status: Home / Self Care | Payer: Medicare Other | Source: Home / Self Care

## 2018-03-28 ENCOUNTER — Other Ambulatory Visit: Payer: Self-pay

## 2018-03-28 ENCOUNTER — Ambulatory Visit (INDEPENDENT_AMBULATORY_CARE_PROVIDER_SITE_OTHER): Payer: Medicare Other | Admitting: Internal Medicine

## 2018-03-28 ENCOUNTER — Inpatient Hospital Stay (HOSPITAL_BASED_OUTPATIENT_CLINIC_OR_DEPARTMENT_OTHER)
Admission: EM | Admit: 2018-03-28 | Discharge: 2018-04-03 | DRG: 841 | Disposition: A | Discharge status: Skilled Nursing Facility | Payer: Medicare Other | Attending: Family Medicine | Admitting: Family Medicine

## 2018-03-28 ENCOUNTER — Encounter: Payer: Self-pay | Admitting: Internal Medicine

## 2018-03-28 VITALS — BP 110/55 | HR 122 | Temp 97.5°F | Resp 18 | Wt 131.0 lb

## 2018-03-28 DIAGNOSIS — M542 Cervicalgia: Secondary | ICD-10-CM | POA: Diagnosis present

## 2018-03-28 DIAGNOSIS — Z993 Dependence on wheelchair: Secondary | ICD-10-CM

## 2018-03-28 DIAGNOSIS — N183 Chronic kidney disease, stage 3 (moderate): Secondary | ICD-10-CM | POA: Diagnosis present

## 2018-03-28 DIAGNOSIS — Z7189 Other specified counseling: Secondary | ICD-10-CM

## 2018-03-28 DIAGNOSIS — C9000 Multiple myeloma not having achieved remission: Secondary | ICD-10-CM | POA: Diagnosis not present

## 2018-03-28 DIAGNOSIS — Z79891 Long term (current) use of opiate analgesic: Secondary | ICD-10-CM

## 2018-03-28 DIAGNOSIS — F200 Paranoid schizophrenia: Secondary | ICD-10-CM

## 2018-03-28 DIAGNOSIS — Z515 Encounter for palliative care: Secondary | ICD-10-CM | POA: Diagnosis not present

## 2018-03-28 DIAGNOSIS — M549 Dorsalgia, unspecified: Secondary | ICD-10-CM | POA: Diagnosis not present

## 2018-03-28 DIAGNOSIS — I6529 Occlusion and stenosis of unspecified carotid artery: Secondary | ICD-10-CM | POA: Diagnosis present

## 2018-03-28 DIAGNOSIS — Z681 Body mass index (BMI) 19 or less, adult: Secondary | ICD-10-CM

## 2018-03-28 DIAGNOSIS — K5900 Constipation, unspecified: Secondary | ICD-10-CM | POA: Diagnosis present

## 2018-03-28 DIAGNOSIS — R000 Tachycardia, unspecified: Secondary | ICD-10-CM | POA: Diagnosis not present

## 2018-03-28 DIAGNOSIS — L8990 Pressure ulcer of unspecified site, unspecified stage: Secondary | ICD-10-CM | POA: Diagnosis present

## 2018-03-28 DIAGNOSIS — R296 Repeated falls: Secondary | ICD-10-CM | POA: Diagnosis present

## 2018-03-28 DIAGNOSIS — S299XXA Unspecified injury of thorax, initial encounter: Secondary | ICD-10-CM | POA: Diagnosis not present

## 2018-03-28 DIAGNOSIS — R627 Adult failure to thrive: Secondary | ICD-10-CM | POA: Diagnosis present

## 2018-03-28 DIAGNOSIS — I129 Hypertensive chronic kidney disease with stage 1 through stage 4 chronic kidney disease, or unspecified chronic kidney disease: Secondary | ICD-10-CM | POA: Diagnosis present

## 2018-03-28 DIAGNOSIS — N189 Chronic kidney disease, unspecified: Secondary | ICD-10-CM | POA: Diagnosis present

## 2018-03-28 DIAGNOSIS — E860 Dehydration: Secondary | ICD-10-CM | POA: Diagnosis present

## 2018-03-28 DIAGNOSIS — L89100 Pressure ulcer of unspecified part of back, unstageable: Secondary | ICD-10-CM | POA: Diagnosis present

## 2018-03-28 DIAGNOSIS — W19XXXA Unspecified fall, initial encounter: Secondary | ICD-10-CM | POA: Diagnosis not present

## 2018-03-28 DIAGNOSIS — R531 Weakness: Secondary | ICD-10-CM

## 2018-03-28 DIAGNOSIS — M62838 Other muscle spasm: Secondary | ICD-10-CM | POA: Diagnosis present

## 2018-03-28 DIAGNOSIS — Z5321 Procedure and treatment not carried out due to patient leaving prior to being seen by health care provider: Secondary | ICD-10-CM | POA: Insufficient documentation

## 2018-03-28 DIAGNOSIS — N3001 Acute cystitis with hematuria: Secondary | ICD-10-CM | POA: Diagnosis not present

## 2018-03-28 DIAGNOSIS — R0902 Hypoxemia: Secondary | ICD-10-CM | POA: Diagnosis not present

## 2018-03-28 DIAGNOSIS — Z880 Allergy status to penicillin: Secondary | ICD-10-CM

## 2018-03-28 DIAGNOSIS — C9030 Solitary plasmacytoma not having achieved remission: Secondary | ICD-10-CM | POA: Diagnosis not present

## 2018-03-28 DIAGNOSIS — Z79899 Other long term (current) drug therapy: Secondary | ICD-10-CM

## 2018-03-28 DIAGNOSIS — K5641 Fecal impaction: Secondary | ICD-10-CM

## 2018-03-28 DIAGNOSIS — M8448XA Pathological fracture, other site, initial encounter for fracture: Secondary | ICD-10-CM | POA: Diagnosis not present

## 2018-03-28 DIAGNOSIS — W1830XA Fall on same level, unspecified, initial encounter: Secondary | ICD-10-CM | POA: Diagnosis present

## 2018-03-28 DIAGNOSIS — M40204 Unspecified kyphosis, thoracic region: Secondary | ICD-10-CM | POA: Diagnosis present

## 2018-03-28 DIAGNOSIS — R6251 Failure to thrive (child): Secondary | ICD-10-CM | POA: Diagnosis not present

## 2018-03-28 DIAGNOSIS — M810 Age-related osteoporosis without current pathological fracture: Secondary | ICD-10-CM | POA: Diagnosis present

## 2018-03-28 DIAGNOSIS — Z66 Do not resuscitate: Secondary | ICD-10-CM | POA: Diagnosis not present

## 2018-03-28 DIAGNOSIS — S199XXA Unspecified injury of neck, initial encounter: Secondary | ICD-10-CM | POA: Diagnosis not present

## 2018-03-28 DIAGNOSIS — R269 Unspecified abnormalities of gait and mobility: Secondary | ICD-10-CM | POA: Diagnosis present

## 2018-03-28 DIAGNOSIS — E440 Moderate protein-calorie malnutrition: Secondary | ICD-10-CM

## 2018-03-28 DIAGNOSIS — D649 Anemia, unspecified: Secondary | ICD-10-CM | POA: Diagnosis present

## 2018-03-28 LAB — COMPREHENSIVE METABOLIC PANEL
ALK PHOS: 62 U/L (ref 38–126)
ALT: 13 U/L (ref 0–44)
AST: 19 U/L (ref 15–41)
Albumin: 3.5 g/dL (ref 3.5–5.0)
Anion gap: 12 (ref 5–15)
BILIRUBIN TOTAL: 1.2 mg/dL (ref 0.3–1.2)
BUN: 30 mg/dL — AB (ref 8–23)
CALCIUM: 11.4 mg/dL — AB (ref 8.9–10.3)
CO2: 27 mmol/L (ref 22–32)
CREATININE: 0.86 mg/dL (ref 0.44–1.00)
Chloride: 97 mmol/L — ABNORMAL LOW (ref 98–111)
GFR calc Af Amer: >60 mL/min (ref >60)
GFR, EST NON AFRICAN AMERICAN: 59 mL/min — AB (ref >60)
Glucose, Bld: 115 mg/dL — ABNORMAL HIGH (ref 70–99)
Potassium: 4.7 mmol/L (ref 3.5–5.1)
SODIUM: 136 mmol/L (ref 135–145)
TOTAL PROTEIN: 7.5 g/dL (ref 6.5–8.1)

## 2018-03-28 LAB — CBC WITH DIFFERENTIAL/PLATELET
BASOS ABS: 0 10*3/uL (ref 0.0–0.1)
BASOS PCT: 0 %
EOS ABS: 0 10*3/uL (ref 0.0–0.7)
Eosinophils Relative: 0 %
HEMATOCRIT: 37 % (ref 36.0–46.0)
HEMOGLOBIN: 12.5 g/dL (ref 12.0–15.0)
LYMPHS ABS: 0.5 10*3/uL — AB (ref 0.7–4.0)
LYMPHS PCT: 4 %
MCH: 32.3 pg (ref 26.0–34.0)
MCHC: 33.8 g/dL (ref 30.0–36.0)
MCV: 95.6 fL (ref 78.0–100.0)
MONO ABS: 0.8 10*3/uL (ref 0.1–1.0)
Monocytes Relative: 7 %
Neutro Abs: 10.3 10*3/uL — ABNORMAL HIGH (ref 1.7–7.7)
Neutrophils Relative %: 89 %
Platelets: 259 10*3/uL (ref 150–400)
RBC: 3.87 MIL/uL (ref 3.87–5.11)
RDW: 12.8 % (ref 11.5–15.5)
WBC: 11.7 10*3/uL — ABNORMAL HIGH (ref 4.0–10.5)

## 2018-03-28 LAB — URINALYSIS, MICROSCOPIC (REFLEX)

## 2018-03-28 LAB — URINALYSIS, ROUTINE W REFLEX MICROSCOPIC
Glucose, UA: NEGATIVE mg/dL
Ketones, ur: 15 mg/dL — AB
NITRITE: NEGATIVE
PH: 5.5 (ref 5.0–8.0)
Protein, ur: 30 mg/dL — AB

## 2018-03-28 MED ORDER — SODIUM CHLORIDE 0.9 % IV BOLUS
500.0000 mL | Freq: Once | INTRAVENOUS | Status: AC
  Administered 2018-03-28: 500 mL via INTRAVENOUS

## 2018-03-28 NOTE — Patient Instructions (Signed)
Please go to the ER for further evaluation °

## 2018-03-28 NOTE — ED Triage Notes (Signed)
Pt sent from PCP d/t impaction. Pt states had a bm this am but wasn't normal

## 2018-03-28 NOTE — ED Provider Notes (Signed)
Medical screening examination/treatment/procedure(s) were conducted as a shared visit with non-physician practitioner(s) and myself.  I personally evaluated the patient during the encounter.  None  89yF with essentially failure to thrive. Unfortunately, her multiple myeloma has significantly progressed. She had previously been getting treatment by oncology but it was held because of her comorbidities with plan to reconsider therapy if she had disease progression.   Unfortunately, I suspect her daughter has been a significant barrier in getting her appropriate follow-up in the past two years. I found my interactions with her daughter extremely difficult and frustrating. I don't know if it has been intentionally malicious but certainly very concerning to me. Based on my interactions, I do not feel her daughter should be involved in medical decision making.  I suspect she may have underlying psychiatric illness and how much she actually comprehends.   She needs to be admitted to the hospital at this point to address both her medical and social issues    Ct Cervical Spine Wo Contrast  Result Date: 03/28/2018 CLINICAL DATA:  Fall EXAM: CT CERVICAL SPINE WITHOUT CONTRAST TECHNIQUE: Multidetector CT imaging of the cervical spine was performed without intravenous contrast. Multiplanar CT image reconstructions were also generated. COMPARISON:  CT 04/02/2016 FINDINGS: Alignment: Study is limited by patient positioning. No subluxation. Facet alignment within normal limits. Skull base and vertebrae: No definitive fracture Soft tissues and spinal canal: No prevertebral fluid or swelling. No visible canal hematoma. Disc levels: Moderate diffuse degenerative change C4 through T1. Mild degenerative change at C3-C4. Upper chest: Lung apices are clear Other: Multiple bilateral upper rib lytic lesions with associated soft tissue masses. Pathologic fracture right first rib. This is a known finding. IMPRESSION: 1. Limited  study secondary to kyphotic patient and positioning. No gross fracture seen 2. Multiple lytic lesion within the visible bilateral ribs with associated soft tissue masses, which may reflect metastatic disease or history of myeloma. Chronic pathologic fracture of the right first rib. Electronically Signed   By: Donavan Foil M.D.   On: 03/28/2018 22:22   Ct Thoracic Spine Wo Contrast  Result Date: 03/28/2018 CLINICAL DATA:  Fall getting out of wheelchair. Patient reports chronic upper back pain, no new pain since fall. EXAM: CT THORACIC SPINE WITHOUT CONTRAST TECHNIQUE: Multidetector CT images of the thoracic were obtained using the standard protocol without intravenous contrast. COMPARISON:  Chest radiograph 05/17/2016.  Chest CT 04/02/2016 FINDINGS: Alignment: Severe thoracic kyphosis centered at T8-T9 with 82 degrees kyphosis, distorting normal anatomy. Vertebrae: Vertebra plana appearance of T8 and T9 vertebral bodies at site of prior expansile lytic spine lesions, prior lesions are not well demonstrated. Multiple new lytic lesions throughout the thoracic and included lumbar spine. New dominant lesion involving T12 near completely replaces the vertebral body with cortical destruction anteriorly, mild loss of height consistent with pathologic fracture. Large T11 lytic lesions involves the left greater than right transverse processes, expansile with large soft tissue component, left greater than right. Soft tissue component invades the spinal canal. Lytic lesion involving L1 near completely replaces the vertebral body. L2 lesion involves the right transverse process with mass effect on the spinal canal. Additional lesion involving T7 vertebral body. Paraspinal and other soft tissues: There are innumerable lytic rib lesions with soft tissue components. Largest in dominant lesion involves the left fifth rib with large soft tissue component extending into the lung parenchyma. Additional lesions involving left  first, second, and third ribs. Expansile lesion involving the right fifth rib at the costovertebral junction. Additional lesion  involving anterior right third rib, posterior right first rib with pathologic fracture. Small left pleural effusion is partially included. Disc levels: Near complete disc space loss at T8 and T9 due to vertebral plana appearance of the vertebral bodies. Multilevel disc space narrowing and endplate spurring. IMPRESSION: 1. Innumerable destructive and expansile lytic lesions throughout the thoracic and included upper lumbar spine, new from 2017. There is varying degree of spinal canal involvement, which is most severe at T11 secondary to expansile transverse process lesions. Previous expansile lesions at T8 and T9 now have a vertebral plana appearance. Additionally there are numerable rib lesions. Given history of plasmacytoma, findings likely represent multiple myeloma. Recommend correlation with prior workup. 2. New pathologic compression fractures of T11 and T12 with mild loss of height. 3. Severe thoracic kyphosis centered at T8-T9 at 82 degrees. 4. If greater detail is required to evaluate the canal, recommend thoracic spine MRI. Electronically Signed   By: Keith Rake M.D.   On: 03/28/2018 22:35      Virgel Manifold, MD 03/29/18 Benancio [place]

## 2018-03-28 NOTE — Telephone Encounter (Signed)
Author phoned [name], daughter, to offer appointment for pt. after 3PM today per Dr. Larose Kells. [name] very anxious about mother's state, and appointment made for 320PM; [name] verbalized understanding.

## 2018-03-28 NOTE — Progress Notes (Signed)
Subjective:    Patient ID: Mikayla Marks, female    DOB: 1927-07-07, 84 y.o.   MRN: [id number]  DOS:  03/28/2018 Type of visit - description : f/u  Interval history: Patient was brought to the office by her daughter at my request. She has been gradually getting weaker. Her kyphosis is getting worse. She is having trouble swallowing as she has a very hard time lifting her head. She has been using oxycodone and sometimes Aleve to help with the pain. Most of the pain is actually in the upper lower back. 2 weeks ago started to get constipated, is the daughter's observation that sometimes the patient has a stool in the rectum but she is too weak to push it out.  Often times she sees a small amounts of hard stools.   Wt Readings from Last 3 Encounters:  03/28/18 131 lb (59.4 kg)  07/26/17 128 lb (58.1 kg)  02/03/17 133 lb 6 oz (60.5 kg)     Review of Systems No fever or chills Nausea today, no vomiting. No cough No obvious skin breakdowns that the daughter can tell.  Past Medical History:  Diagnosis Date  . Carotid artery stenosis   . Chronic kidney disease   . Hyperlipidemia   . HYPERTENSION   . Osteoporosis   . Paranoid schizophrenia ([place])   . SCC (squamous cell carcinoma)    in situ R pretibial area    Past Surgical History:  Procedure Laterality Date  . BREAST BIOPSY     benign tumor  . CATARACT EXTRACTION    . CHOLECYSTECTOMY    . IR GENERIC HISTORICAL  04/04/2016   IR FLUORO GUIDED NEEDLE PLC ASPIRATION/INJECTION LOC 04/04/2016 Luanne Bras, MD [facility]  . LASER ABLATION Left    left LE   . LEG SURGERY Right 10/2012   Ruptured Veins  . PARTIAL HYSTERECTOMY     due to bleeding  . TONSILLECTOMY      Social History   Socioeconomic History  . Marital status: Single    Spouse name: Not on file  . Number of children: 3  . Years of education: Not on file  . Highest education level: Not on file  Occupational History  . 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 Financial resource strain: Not on file  . Food insecurity:    Worry: Not on file    Inability: Not on file  . Transportation needs:    Medical: Not on file    Non-medical: Not on file  Tobacco Use  . Smoking status: Never Smoker  . Smokeless tobacco: Never Used  Substance and Sexual Activity  . Alcohol use: No    Alcohol/week: 0.0 standard drinks  . Drug use: No  . Sexual activity: Never  Lifestyle  . Physical activity:    Days per week: Not on file    Minutes per session: Not on file  . Stress: Not on file  Relationships  . Social connections:    Talks on phone: Not on file    Gets together: Not on file    Attends religious service: Not on file    Active member of club or organization: Not on file    Attends meetings of clubs or organizations: Not on file    Relationship status: Not on file  . Intimate partner violence:    Fear of current or ex partner: Not on file    Emotionally abused: Not on file    Physically  abused: Not on file    Forced sexual activity: Not on file  Other Topics Concern  . Not on file  Social History Narrative   Daughter lives with her [name]) ; pt is  widow   Brother [name] in [place], lives 5 min away [phone]         Allergies as of 03/28/2018      Reactions   Other Other (See Comments)   5FU cream - stroke like symptoms   Bacitracin-polymyxin B Other (See Comments)   Reaction unknown.    Penicillins Rash      Medication List        Accurate as of 03/28/18  4:21 PM. Always use your most recent med list.          CALTRATE 600 PO Take 1 tablet by mouth daily.   cycloSPORINE 0.05 % ophthalmic emulsion Commonly known as:  RESTASIS Place 1 drop into both eyes 2 (two) times daily.   multivitamin with minerals tablet Take 1 tablet by mouth 4 (four) times a week.   naproxen sodium 220 MG tablet Commonly known as:  ALEVE Take 220 mg by mouth daily as needed.   oxyCODONE 5 MG immediate release  tablet Commonly known as:  Oxy IR/ROXICODONE Take 1 tablet (5 mg total) by mouth 3 (three) times daily as needed for moderate pain.   SALONPAS EX Apply 1 patch topically as needed (for pain).   senna 8.6 MG Tabs tablet Commonly known as:  SENOKOT Take 1 tablet (8.6 mg total) by mouth 2 (two) times daily.   tiZANidine 4 MG tablet Commonly known as:  ZANAFLEX Take 1 tablet (4 mg total) every 6 (six) hours as needed by mouth for muscle spasms.          Objective:   Physical Exam BP (!) 110/55 (BP Location: Right Arm, Patient Position: Sitting, Cuff Size: Normal)   Pulse (!) 122   Temp (!) 97.5 F (36.4 C) (Oral)   Resp 18   Wt 131 lb (59.4 kg) Comment: with w/c. daughter does not know how much chair weighs  SpO2 93%   BMI 21.14 kg/m  General:   Elderly lady, sitting in a wheelchair, alert, marked kyphosis noted. HEENT:  Normocephalic .  Unable to lift her head much. Lungs:  CTA B Normal respiratory effort, no intercostal retractions, no accessory muscle use. Heart: RRR,  no murmur.  + Edema, from the left mid pretibial area down.   Skin: Skin at the thoracic area covered with a Band-Aid.  Mild redness noted. Neurologic:  alert & cooperative, speech is consistent with schizophrenia. See picture, we attempted to transfer the patient from the wheelchair to the table but the daughter requested to stop. Psych--  No anxious or depressed appearing.        Assessment & Plan:   Assessment HTN Hyperlipidemia Schizophrenia Osteoporosis : Per DEXA 2010, DEXA 2013 with Dr. Amalia Hailey: Osteopenia. 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 Vitamin D deficiency Varicose pain, lower extremity edema: [place] (-) DVT 08-2014 SCC, R pretibial  PLAN  Failure to thrive Since the last office  visit she has markedly deteriorated. Her kyphosis is much worse making eating very difficult.  She also is getting constipated, the daughter reports that often times she has stools in the rectum  but is so weak she cannot push the stools out. I attempted - with the help of 2 nurses- to transfer pt on a table to inspect her perineum (pressure ulcers?)  And do a rectal exam (r/o impaction) but the daughter got nervous and felt like pt could not tolerate that. In summary, she is not doing well,  I think she needs further evaluation. -X-rays to evaluate her thoracic and cervical spine -Rule out fecal impaction  -Also I would recommend an admission, she is deteriorating and going forward she will need more help at home. Discussed with the ER physician who agreed to saw to see her.

## 2018-03-28 NOTE — Telephone Encounter (Signed)
Okay, advised patient to come   at 3 PM, will work her in.

## 2018-03-28 NOTE — ED Provider Notes (Signed)
[facility] Provider Note   CSN: [id number] Arrival date & time: 03/28/18  1909     History   Chief Complaint Chief Complaint  Patient presents with  . Fall    HPI Mikayla Marks is a 84 y.o. female.  84 y.o female with a PMH of HTN, HLD, Osteoporosis presents to the ED s/p mechanical fall this afternoon.  Patient reports she was on her way back from [facility] after leaving without being seen and states she fell and tripped in the garage being on water bottles.  She reports pain all along her body especially her neck region.  Patient was seen by Dr. Concepcion Living who actually sent patient to the ED for failure to thrive.  Patient's daughter at the bedside states patient has not been eating or drinking as much lately and says she is in a lot of pain.  She denies hitting her head and is currently not on blood thinners.  Reports there is mainly pain around her neck region and lower lumbar region.  At the bedside states mother has been not herself at home. She denies any chest pain, shortness of breath, other complaints.     Past Medical History:  Diagnosis Date  . Carotid artery stenosis   . Chronic kidney disease   . Hyperlipidemia   . HYPERTENSION   . Osteoporosis   . Paranoid schizophrenia ([place])   . SCC (squamous cell carcinoma)    in situ R pretibial area    Patient Active Problem List   Diagnosis Date Noted  . Failure to thrive in adult 03/28/2018  . Dry eyes, bilateral 04/17/2016  . Chronic kidney disease   . Carotid artery stenosis   . Spinal cord compression ([place])   . Prolonged QT interval 04/03/2016  . Palliative care encounter   . Intractable back pain 04/02/2016  . Plasmacytoma ([place]) 04/02/2016  . Pathologic vertebral fracture 11/12/2015  . Dyspnea 11/11/2015  . Pleural effusion, bilateral 11/11/2015  . PCP NOTES >>>>>>>>>>>>>>>>>>>>>>>>>>>>>>>> 10/26/2015  . Hammer toe of left foot 01/30/2015  . Bilateral leg edema 09/10/2014  .  Onychomycosis 05/19/2014  . Pain in lower limb 05/19/2014  . Fatigue 02/11/2014  . Medicare annual wellness visit, subsequent 09/02/2013  . Pain in joint, foot, left 07/10/2013  . Corn of toe 02/04/2013  . Varicose veins of lower extremities with inflammation 11/20/2012  . DEGENERATIVE JOINT DISEASE 01/12/2010  . Schizophrenia ([place]) 06/30/2009  . SKIN CANCER, LEG 05/29/2009  . Osteoporosis 08/26/2008  . HYPERLIPIDEMIA 10/16/2006  . Essential hypertension 10/16/2006    Past Surgical History:  Procedure Laterality Date  . BREAST BIOPSY     benign tumor  . CATARACT EXTRACTION    . CHOLECYSTECTOMY    . IR GENERIC HISTORICAL  04/04/2016   IR FLUORO GUIDED NEEDLE PLC ASPIRATION/INJECTION LOC 04/04/2016 Luanne Bras, MD [facility]  . LASER ABLATION Left    left LE   . LEG SURGERY Right 10/2012   Ruptured Veins  . PARTIAL HYSTERECTOMY     due to bleeding  . TONSILLECTOMY       OB History   None      Home Medications    Prior to Admission medications   Medication Sig Start Date End Date Taking? Authorizing Provider  Calcium Carbonate (CALTRATE 600 PO) Take 1 tablet by mouth daily.     [provider]  cycloSPORINE (RESTASIS) 0.05 % ophthalmic emulsion Place 1 drop into both eyes 2 (two) times daily.  [provider]  Liniments (SALONPAS EX) Apply 1 patch topically as needed (for pain).    [provider]  Multiple Vitamins-Minerals (MULTIVITAMIN WITH MINERALS) tablet Take 1 tablet by mouth 4 (four) times a week.     [provider]  naproxen sodium (ALEVE) 220 MG tablet Take 220 mg by mouth daily as needed.    [provider]  oxyCODONE (OXY IR/ROXICODONE) 5 MG immediate release tablet Take 1 tablet (5 mg total) by mouth 3 (three) times daily as needed for moderate pain. 03/20/18   Colon Branch, MD  senna (SENOKOT) 8.6 MG TABS tablet Take 1 tablet (8.6 mg total) by mouth 2 (two) times daily. Patient not taking: Reported on  03/28/2018 04/12/16   Hosie Poisson, MD  tiZANidine (ZANAFLEX) 4 MG tablet Take 1 tablet (4 mg total) every 6 (six) hours as needed by mouth for muscle spasms. 05/12/17   Colon Branch, MD    Family History Family History  Problem Relation Age of Onset  . Heart Problems Mother   . Diabetes Mother   . Colon cancer Neg Hx   . Breast cancer Neg Hx   . CAD Neg Hx     Social History Social History   Tobacco Use  . Smoking status: Never Smoker  . Smokeless tobacco: Never Used  Substance Use Topics  . Alcohol use: No    Alcohol/week: 0.0 standard drinks  . Drug use: No     Allergies   Other; Bacitracin-polymyxin b; and Penicillins   Review of Systems Review of Systems  Constitutional: Positive for activity change and appetite change. Negative for chills and fever.  HENT: Negative for sore throat.   Respiratory: Negative for chest tightness and shortness of breath.   Cardiovascular: Negative for chest pain and palpitations.  Gastrointestinal: Negative for abdominal pain, diarrhea, nausea and vomiting.  Genitourinary: Negative for dysuria and flank pain.  Musculoskeletal: Positive for back pain and myalgias.  Skin: Negative for pallor and wound.  Neurological: Negative for syncope, weakness, light-headedness and headaches.  All other systems reviewed and are negative.    Physical Exam Updated Vital Signs BP (!) 148/73   Pulse (!) 116   Temp 98.7 F (37.1 C) (Oral)   Resp 16   SpO2 97%   Physical Exam  Constitutional: She is oriented to person, place, and time. She appears well-developed and well-nourished.  HENT:  Head: Normocephalic and atraumatic.  Neck: Normal range of motion. Neck supple.  Cardiovascular: Normal heart sounds.  Pulmonary/Chest: Breath sounds normal.  Abdominal: Soft. There is no tenderness.  Musculoskeletal: She exhibits tenderness.       Cervical back: She exhibits tenderness, pain and spasm.  Severe kyphosis noted to spine.   Neurological:  She is alert and oriented to person, place, and time.  Alert, oriented, thought content appropriate. Speech fluent without evidence of aphasia. Able to follow 2 step commands without difficulty.  Cranial Nerves:  II:  Peripheral visual fields grossly normal, pupils, round, reactive to light III,IV, VI: ptosis not present, extra-ocular motions intact bilaterally  V,VII: smile symmetric, facial light touch sensation equal VIII: hearing grossly normal bilaterally  IX,X: midline uvula rise  XI: bilateral shoulder shrug equal and strong XII: midline tongue extension  Motor:  5/5 in upper and lower extremities bilaterally including strong and equal grip strength and dorsiflexion/plantar flexion Sensory: light touch normal in all extremities.  Cerebellar: normal finger-to-nose with bilateral upper extremities, pronator drift negative Gait: normal gait and balance  Skin: Skin is warm.  Nursing note and vitals reviewed.    ED Treatments / Results  Labs (all labs ordered are listed, but only abnormal results are displayed) Labs Reviewed  CBC WITH DIFFERENTIAL/PLATELET - Abnormal; Notable for the following components:      Result Value   WBC 11.7 (*)    Neutro Abs 10.3 (*)    Lymphs Abs 0.5 (*)    All other components within normal limits  COMPREHENSIVE METABOLIC PANEL - Abnormal; Notable for the following components:   Chloride 97 (*)    Glucose, Bld 115 (*)    BUN 30 (*)    Calcium 11.4 (*)    GFR calc non Af Amer 59 (*)    All other components within normal limits  URINALYSIS, ROUTINE W REFLEX MICROSCOPIC - Abnormal; Notable for the following components:   APPearance HAZY (*)    Specific Gravity, Urine >1.030 (*)    Hgb urine dipstick TRACE (*)    Bilirubin Urine SMALL (*)    Ketones, ur 15 (*)    Protein, ur 30 (*)    Leukocytes, UA TRACE (*)    All other components within normal limits  URINALYSIS, MICROSCOPIC (REFLEX) - Abnormal; Notable for the following components:    Bacteria, UA FEW (*)    All other components within normal limits  URINE CULTURE    EKG None  Radiology Ct Cervical Spine Wo Contrast  Result Date: 03/28/2018 CLINICAL DATA:  Fall EXAM: CT CERVICAL SPINE WITHOUT CONTRAST TECHNIQUE: Multidetector CT imaging of the cervical spine was performed without intravenous contrast. Multiplanar CT image reconstructions were also generated. COMPARISON:  CT 04/02/2016 FINDINGS: Alignment: Study is limited by patient positioning. No subluxation. Facet alignment within normal limits. Skull base and vertebrae: No definitive fracture Soft tissues and spinal canal: No prevertebral fluid or swelling. No visible canal hematoma. Disc levels: Moderate diffuse degenerative change C4 through T1. Mild degenerative change at C3-C4. Upper chest: Lung apices are clear Other: Multiple bilateral upper rib lytic lesions with associated soft tissue masses. Pathologic fracture right first rib. This is a known finding. IMPRESSION: 1. Limited study secondary to kyphotic patient and positioning. No gross fracture seen 2. Multiple lytic lesion within the visible bilateral ribs with associated soft tissue masses, which may reflect metastatic disease or history of myeloma. Chronic pathologic fracture of the right first rib. Electronically Signed   By: Donavan Foil M.D.   On: 03/28/2018 22:22   Ct Thoracic Spine Wo Contrast  Result Date: 03/28/2018 CLINICAL DATA:  Fall getting out of wheelchair. Patient reports chronic upper back pain, no new pain since fall. EXAM: CT THORACIC SPINE WITHOUT CONTRAST TECHNIQUE: Multidetector CT images of the thoracic were obtained using the standard protocol without intravenous contrast. COMPARISON:  Chest radiograph 05/17/2016.  Chest CT 04/02/2016 FINDINGS: Alignment: Severe thoracic kyphosis centered at T8-T9 with 82 degrees kyphosis, distorting normal anatomy. Vertebrae: Vertebra plana appearance of T8 and T9 vertebral bodies at site of prior expansile  lytic spine lesions, prior lesions are not well demonstrated. Multiple new lytic lesions throughout the thoracic and included lumbar spine. New dominant lesion involving T12 near completely replaces the vertebral body with cortical destruction anteriorly, mild loss of height consistent with pathologic fracture. Large T11 lytic lesions involves the left greater than right transverse processes, expansile with large soft tissue component, left greater than right. Soft tissue component invades the spinal canal. Lytic lesion involving L1 near completely replaces the vertebral body. L2 lesion involves the right transverse  process with mass effect on the spinal canal. Additional lesion involving T7 vertebral body. Paraspinal and other soft tissues: There are innumerable lytic rib lesions with soft tissue components. Largest in dominant lesion involves the left fifth rib with large soft tissue component extending into the lung parenchyma. Additional lesions involving left first, second, and third ribs. Expansile lesion involving the right fifth rib at the costovertebral junction. Additional lesion involving anterior right third rib, posterior right first rib with pathologic fracture. Small left pleural effusion is partially included. Disc levels: Near complete disc space loss at T8 and T9 due to vertebral plana appearance of the vertebral bodies. Multilevel disc space narrowing and endplate spurring. IMPRESSION: 1. Innumerable destructive and expansile lytic lesions throughout the thoracic and included upper lumbar spine, new from 2017. There is varying degree of spinal canal involvement, which is most severe at T11 secondary to expansile transverse process lesions. Previous expansile lesions at T8 and T9 now have a vertebral plana appearance. Additionally there are numerable rib lesions. Given history of plasmacytoma, findings likely represent multiple myeloma. Recommend correlation with prior workup. 2. New pathologic  compression fractures of T11 and T12 with mild loss of height. 3. Severe thoracic kyphosis centered at T8-T9 at 82 degrees. 4. If greater detail is required to evaluate the canal, recommend thoracic spine MRI. Electronically Signed   By: Keith Rake M.D.   On: 03/28/2018 22:35    Procedures Procedures (including critical care time)  Medications Ordered in ED Medications  sodium chloride 0.9 % bolus 500 mL (500 mLs Intravenous New Bag/Given 03/28/18 2333)     Initial Impression / Assessment and Plan / ED Course  I have reviewed the triage vital signs and the nursing notes.  Pertinent labs & imaging results that were available during my care of the patient were reviewed by me and considered in my medical decision making (see chart for details).     Patient sent in by Dr. Concepcion Living for failure to thrive, she had a mechanical fall when returning back from [facility] that she was unable to be seen due to the long way.Patient reports some neck pain along with pain in her extremities.  CMP showed slight elevation of her leukocytosis of 11.7, UA does show some nitrates, leukocytes at this time I believe patient has a urinary tract infection along with some blood in her urine.  CMP shows slightly increasing calcium 11.4 will obtain EKG for hypercalcemia.  Patient received a bolus of 500 mg of fluids at this time.  Her remains at the bedside.  Does have a previous history of myeloma and has an Materials engineer on board.  Patient at this time states she wants her mother to eat and improve in her condition.   CT imaging showed.  Innumerable destructive and expansile lytic lesions throughout the thoracic and included upper lumbar spine, new from 2017. There is varying degree of spinal canal involvement, which is most severe at T11 secondary to expansile transverse process lesions. Previous expansile lesions at T8 and T9 now have a vertebral plana appearance. Additionally there are numerable rib lesions.  Given history of plasmacytoma, findings likely represent multiple myeloma. Recommend correlation with prior workup. 2. New pathologic compression fractures of T11 and T12 with mild loss of height. 3. Severe thoracic kyphosis centered at T8-T9 at 82 degrees. 4. If greater detail is required to evaluate the canal, recommend thoracic spine MRI.  11:39 PM spoke to hospitalist Dr. Hal Hope advised patient to obtain a EKG prior to  admission, he will evaluate and admit patient at this time. Final Clinical Impressions(s) / ED Diagnoses   Final diagnoses:  Acute cystitis with hematuria  Failure to thrive (0-17)    ED Discharge Orders    None       Janeece Fitting, PA-C 03/28/18 2350    Virgel Manifold, MD 04/02/18 [phone]

## 2018-03-28 NOTE — ED Notes (Signed)
ED Provider at bedside. 

## 2018-03-28 NOTE — Telephone Encounter (Signed)
Copied from [place] [id number]. Topic: Quick Communication - See Telephone Encounter >> Mar 28, 2018  9:03 AM Antonieta Iba C wrote: CRM for notification. See Telephone encounter for: 03/28/18.  [name] pts daughter called in yesterday and cancelled pt's apt. Daughter says that mom actually wanted to come in to be seen by provider today and wanted her apt. Daughter is hoping that pt could be worked in to providers schedule today. Daughter [name] says that it is urgent that pt sees PCP today.    - [phone] OR [phone]

## 2018-03-28 NOTE — Telephone Encounter (Signed)
Can you add Pt and let Pt's daughter know for her to be here after 3PM. Please.

## 2018-03-28 NOTE — ED Notes (Signed)
Pt says she was getting out of her w/c trying to get up the steps, and she fell against some plastic bottles. She said said she hit her head on the bottles. Denies LOC. Reports chronic back pain, no new pain since fall.

## 2018-03-28 NOTE — ED Notes (Signed)
Patient transported to CT 

## 2018-03-28 NOTE — ED Triage Notes (Signed)
Pt arrives via EMS from home. Per their report, the patient had mechanical fall out of her wheelchair at home today. She c/o some back pain, hx of chronic back pain. Pt is alert and oriented. No loc, no blood thinners. 140/80, hr 120, 96% RA.

## 2018-03-28 NOTE — Telephone Encounter (Signed)
Please advise if okay to work Pt into afternoon.

## 2018-03-29 ENCOUNTER — Encounter (HOSPITAL_BASED_OUTPATIENT_CLINIC_OR_DEPARTMENT_OTHER): Payer: Self-pay | Admitting: *Deleted

## 2018-03-29 DIAGNOSIS — L89100 Pressure ulcer of unspecified part of back, unstageable: Secondary | ICD-10-CM | POA: Diagnosis present

## 2018-03-29 DIAGNOSIS — I10 Essential (primary) hypertension: Secondary | ICD-10-CM | POA: Diagnosis not present

## 2018-03-29 DIAGNOSIS — M8448XS Pathological fracture, other site, sequela: Secondary | ICD-10-CM | POA: Diagnosis not present

## 2018-03-29 DIAGNOSIS — Z515 Encounter for palliative care: Secondary | ICD-10-CM | POA: Diagnosis not present

## 2018-03-29 DIAGNOSIS — I129 Hypertensive chronic kidney disease with stage 1 through stage 4 chronic kidney disease, or unspecified chronic kidney disease: Secondary | ICD-10-CM | POA: Diagnosis present

## 2018-03-29 DIAGNOSIS — K5900 Constipation, unspecified: Secondary | ICD-10-CM | POA: Diagnosis present

## 2018-03-29 DIAGNOSIS — Z681 Body mass index (BMI) 19 or less, adult: Secondary | ICD-10-CM | POA: Diagnosis not present

## 2018-03-29 DIAGNOSIS — R531 Weakness: Secondary | ICD-10-CM

## 2018-03-29 DIAGNOSIS — M8448XD Pathological fracture, other site, subsequent encounter for fracture with routine healing: Secondary | ICD-10-CM | POA: Diagnosis not present

## 2018-03-29 DIAGNOSIS — S22080D Wedge compression fracture of T11-T12 vertebra, subsequent encounter for fracture with routine healing: Secondary | ICD-10-CM | POA: Diagnosis not present

## 2018-03-29 DIAGNOSIS — R498 Other voice and resonance disorders: Secondary | ICD-10-CM | POA: Diagnosis not present

## 2018-03-29 DIAGNOSIS — Z7401 Bed confinement status: Secondary | ICD-10-CM | POA: Diagnosis not present

## 2018-03-29 DIAGNOSIS — F200 Paranoid schizophrenia: Secondary | ICD-10-CM | POA: Diagnosis present

## 2018-03-29 DIAGNOSIS — C9000 Multiple myeloma not having achieved remission: Secondary | ICD-10-CM | POA: Diagnosis present

## 2018-03-29 DIAGNOSIS — M8448XA Pathological fracture, other site, initial encounter for fracture: Secondary | ICD-10-CM | POA: Diagnosis present

## 2018-03-29 DIAGNOSIS — N3001 Acute cystitis with hematuria: Secondary | ICD-10-CM

## 2018-03-29 DIAGNOSIS — R269 Unspecified abnormalities of gait and mobility: Secondary | ICD-10-CM | POA: Diagnosis present

## 2018-03-29 DIAGNOSIS — M542 Cervicalgia: Secondary | ICD-10-CM | POA: Diagnosis present

## 2018-03-29 DIAGNOSIS — W1830XA Fall on same level, unspecified, initial encounter: Secondary | ICD-10-CM | POA: Diagnosis not present

## 2018-03-29 DIAGNOSIS — Z79899 Other long term (current) drug therapy: Secondary | ICD-10-CM | POA: Diagnosis not present

## 2018-03-29 DIAGNOSIS — M8458XS Pathological fracture in neoplastic disease, other specified site, sequela: Secondary | ICD-10-CM | POA: Diagnosis not present

## 2018-03-29 DIAGNOSIS — E440 Moderate protein-calorie malnutrition: Secondary | ICD-10-CM | POA: Diagnosis present

## 2018-03-29 DIAGNOSIS — R1312 Dysphagia, oropharyngeal phase: Secondary | ICD-10-CM | POA: Diagnosis not present

## 2018-03-29 DIAGNOSIS — D649 Anemia, unspecified: Secondary | ICD-10-CM

## 2018-03-29 DIAGNOSIS — R6251 Failure to thrive (child): Secondary | ICD-10-CM

## 2018-03-29 DIAGNOSIS — Z7189 Other specified counseling: Secondary | ICD-10-CM | POA: Diagnosis not present

## 2018-03-29 DIAGNOSIS — Z880 Allergy status to penicillin: Secondary | ICD-10-CM | POA: Diagnosis not present

## 2018-03-29 DIAGNOSIS — C9030 Solitary plasmacytoma not having achieved remission: Secondary | ICD-10-CM | POA: Diagnosis not present

## 2018-03-29 DIAGNOSIS — Z66 Do not resuscitate: Secondary | ICD-10-CM | POA: Diagnosis present

## 2018-03-29 DIAGNOSIS — Z993 Dependence on wheelchair: Secondary | ICD-10-CM | POA: Diagnosis not present

## 2018-03-29 DIAGNOSIS — R296 Repeated falls: Secondary | ICD-10-CM | POA: Diagnosis present

## 2018-03-29 DIAGNOSIS — R627 Adult failure to thrive: Secondary | ICD-10-CM

## 2018-03-29 DIAGNOSIS — K7290 Hepatic failure, unspecified without coma: Secondary | ICD-10-CM | POA: Diagnosis not present

## 2018-03-29 DIAGNOSIS — E860 Dehydration: Secondary | ICD-10-CM | POA: Diagnosis present

## 2018-03-29 DIAGNOSIS — R2681 Unsteadiness on feet: Secondary | ICD-10-CM | POA: Diagnosis not present

## 2018-03-29 DIAGNOSIS — M62838 Other muscle spasm: Secondary | ICD-10-CM | POA: Diagnosis present

## 2018-03-29 DIAGNOSIS — R29898 Other symptoms and signs involving the musculoskeletal system: Secondary | ICD-10-CM | POA: Diagnosis not present

## 2018-03-29 DIAGNOSIS — M6281 Muscle weakness (generalized): Secondary | ICD-10-CM | POA: Diagnosis not present

## 2018-03-29 DIAGNOSIS — N183 Chronic kidney disease, stage 3 (moderate): Secondary | ICD-10-CM | POA: Diagnosis present

## 2018-03-29 DIAGNOSIS — M2550 Pain in unspecified joint: Secondary | ICD-10-CM | POA: Diagnosis not present

## 2018-03-29 DIAGNOSIS — I6529 Occlusion and stenosis of unspecified carotid artery: Secondary | ICD-10-CM | POA: Diagnosis present

## 2018-03-29 DIAGNOSIS — M810 Age-related osteoporosis without current pathological fracture: Secondary | ICD-10-CM | POA: Diagnosis present

## 2018-03-29 DIAGNOSIS — R5381 Other malaise: Secondary | ICD-10-CM | POA: Diagnosis not present

## 2018-03-29 LAB — CBC
HEMATOCRIT: 34.8 % — AB (ref 36.0–46.0)
Hemoglobin: 11.6 g/dL — ABNORMAL LOW (ref 12.0–15.0)
MCH: 32.6 pg (ref 26.0–34.0)
MCHC: 33.3 g/dL (ref 30.0–36.0)
MCV: 97.8 fL (ref 78.0–100.0)
Platelets: 276 10*3/uL (ref 150–400)
RBC: 3.56 MIL/uL — ABNORMAL LOW (ref 3.87–5.11)
RDW: 13.4 % (ref 11.5–15.5)
WBC: 9.3 10*3/uL (ref 4.0–10.5)

## 2018-03-29 LAB — CREATININE, SERUM: Creatinine, Ser: 0.7 mg/dL (ref 0.44–1.00)

## 2018-03-29 MED ORDER — BISACODYL 10 MG RE SUPP
10.0000 mg | Freq: Once | RECTAL | Status: AC
  Administered 2018-03-29: 10 mg via RECTAL
  Filled 2018-03-29: qty 1

## 2018-03-29 MED ORDER — ENSURE ENLIVE PO LIQD
237.0000 mL | Freq: Two times a day (BID) | ORAL | Status: DC
  Administered 2018-03-29 – 2018-04-03 (×6): 237 mL via ORAL

## 2018-03-29 MED ORDER — SODIUM CHLORIDE 0.9 % IV SOLN
1.0000 g | INTRAVENOUS | Status: DC
  Administered 2018-03-29 – 2018-03-30 (×2): 1 g via INTRAVENOUS
  Filled 2018-03-29 (×2): qty 1

## 2018-03-29 MED ORDER — ADULT MULTIVITAMIN W/MINERALS CH
1.0000 | ORAL_TABLET | Freq: Every day | ORAL | Status: DC
  Administered 2018-03-31 – 2018-04-03 (×4): 1 via ORAL
  Filled 2018-03-29 (×6): qty 1

## 2018-03-29 MED ORDER — CYCLOSPORINE 0.05 % OP EMUL
1.0000 [drp] | Freq: Two times a day (BID) | OPHTHALMIC | Status: DC
  Administered 2018-03-29 – 2018-04-03 (×11): 1 [drp] via OPHTHALMIC
  Filled 2018-03-29 (×13): qty 1

## 2018-03-29 MED ORDER — OXYCODONE HCL 5 MG PO TABS
5.0000 mg | ORAL_TABLET | Freq: Three times a day (TID) | ORAL | Status: DC | PRN
  Administered 2018-03-29 – 2018-04-03 (×10): 5 mg via ORAL
  Filled 2018-03-29 (×11): qty 1

## 2018-03-29 MED ORDER — SODIUM CHLORIDE 0.9 % IV SOLN
INTRAVENOUS | Status: AC
  Administered 2018-03-29: 08:00:00 via INTRAVENOUS

## 2018-03-29 MED ORDER — HEPARIN SODIUM (PORCINE) 5000 UNIT/ML IJ SOLN
5000.0000 [IU] | Freq: Three times a day (TID) | INTRAMUSCULAR | Status: DC
  Administered 2018-03-29 – 2018-04-03 (×13): 5000 [IU] via SUBCUTANEOUS
  Filled 2018-03-29 (×14): qty 1

## 2018-03-29 MED ORDER — SENNA 8.6 MG PO TABS
1.0000 | ORAL_TABLET | Freq: Two times a day (BID) | ORAL | Status: DC
  Administered 2018-03-29 – 2018-04-02 (×8): 8.6 mg via ORAL
  Filled 2018-03-29 (×9): qty 1

## 2018-03-29 MED ORDER — COLLAGENASE 250 UNIT/GM EX OINT
TOPICAL_OINTMENT | Freq: Every day | CUTANEOUS | Status: DC
  Administered 2018-03-29: 1 via TOPICAL
  Administered 2018-03-31 – 2018-04-02 (×3): via TOPICAL
  Filled 2018-03-29 (×2): qty 90

## 2018-03-29 MED ORDER — ACETAMINOPHEN 325 MG PO TABS: 650.0000 mg | ORAL_TABLET | Freq: Four times a day (QID) | ORAL | Status: DC | PRN

## 2018-03-29 MED ORDER — MAGNESIUM CITRATE PO SOLN: 1.0000 | Freq: Once | ORAL | Status: DC | PRN

## 2018-03-29 MED ORDER — ACETAMINOPHEN 650 MG RE SUPP: 650.0000 mg | Freq: Four times a day (QID) | RECTAL | Status: DC | PRN

## 2018-03-29 MED ORDER — HEPARIN SODIUM (PORCINE) 5000 UNIT/ML IJ SOLN
5000.0000 [IU] | Freq: Three times a day (TID) | INTRAMUSCULAR | Status: DC
  Administered 2018-03-29: 5000 [IU] via SUBCUTANEOUS
  Filled 2018-03-29: qty 1

## 2018-03-29 MED ORDER — POLYETHYLENE GLYCOL 3350 17 G PO PACK
34.0000 g | PACK | Freq: Two times a day (BID) | ORAL | Status: AC
  Administered 2018-03-29 (×2): 34 g via ORAL
  Filled 2018-03-29 (×2): qty 2

## 2018-03-29 MED ORDER — ALBUTEROL SULFATE (2.5 MG/3ML) 0.083% IN NEBU: 2.5000 mg | INHALATION_SOLUTION | RESPIRATORY_TRACT | Status: DC | PRN

## 2018-03-29 NOTE — Progress Notes (Signed)
Called [facility] admits & Consult to follow up orders for the patient. We will continue to monitor.

## 2018-03-29 NOTE — Progress Notes (Signed)
Pt transferred to [facility] via bed accompanied by daughter, nurse and Stage manager.

## 2018-03-29 NOTE — H&P (Signed)
TRH H&P   Patient Demographics:    Mikayla Marks, is a 84 y.o. female  MRN: [id number]   DOB - 03-19-1928  Admit Date - 03/28/2018  Outpatient Primary MD for the patient is Colon Branch, MD  Referring MD/NP/PA: Dr Wilson Singer  Patient coming from: home  Chief Complaint  Patient presents with  . Fall      HPI:    Mikayla Marks  is a 84 y.o. female, past medical history of paranoid schizophrenia, hypertension, severe kyphosis and plasmacytoma, was brought to ED by her daughter, for poor appetite and oral intake, and falls, daughter patient had progressive functional decline over last few months, she has been wheelchair dependent over last 2 months, but able to transfer from bed to chair, this has even worsened over last few days, she reported full yesterday when she was trying to climb one step with assistance from her wheelchair, reports decreased oral intake, no appetite, and will decrease fluid intake, patient herself denies any chest pain, shortness of breath, but reported having generalized weakness, some dysuria and dark-colored urine and worsening lower back pain. -In ED her work-up was significant for significant worsening of lytic lesion in her thoracic spine, with new compression fraction in T11, she had positive urine analysis, and she had a clinical dehydration, otherwise vital signs were stable as well with no significant lab abnormalities.    Review of systems:    In addition to the HPI above,  No Fever-chills, poor generalized weakness, poor appetite, and falls over last 2 days No Headache, No changes with Vision or hearing, No problems swallowing food or Liquids, No Chest pain, Cough or Shortness of Breath, No Abdominal pain, No Nausea or Vommitting, Bowel movements are regular, No Blood in stool or Urine, Reports some dysuria and dark-colored urine No new skin  rashes or bruises, No new joints pains-aches,  No new weakness, tingling, numbness in any extremity, Ports some poor appetite and weight loss No polyuria, polydypsia or polyphagia, No significant Mental Stressors.  A full 10 point Review of Systems was done, except as stated above, all other Review of Systems were negative.   With Past History of the following :    Past Medical History:  Diagnosis Date  . Carotid artery stenosis   . Chronic kidney disease   . Hyperlipidemia   . HYPERTENSION   . Osteoporosis   . Paranoid schizophrenia ([place])   . SCC (squamous cell carcinoma)    in situ R pretibial area      Past Surgical History:  Procedure Laterality Date  . BREAST BIOPSY     benign tumor  . CATARACT EXTRACTION    . CHOLECYSTECTOMY    . IR GENERIC HISTORICAL  04/04/2016   IR FLUORO GUIDED NEEDLE PLC ASPIRATION/INJECTION LOC 04/04/2016 Luanne Bras, MD [facility]  . LASER ABLATION Left    left LE   .  LEG SURGERY Right 10/2012   Ruptured Veins  . PARTIAL HYSTERECTOMY     due to bleeding  . TONSILLECTOMY        Social History:     Social History   Tobacco Use  . Smoking status: Never Smoker  . Smokeless tobacco: Never Used  Substance Use Topics  . Alcohol use: No    Alcohol/week: 0.0 standard drinks     Lives -at home with daughter  Mobility -wheelchair     Family History :     Family History  Problem Relation Age of Onset  . Heart Problems Mother   . Diabetes Mother   . Colon cancer Neg Hx   . Breast cancer Neg Hx   . CAD Neg Hx       Home Medications:   Prior to Admission medications   Medication Sig Start Date End Date Taking? Authorizing Provider  cycloSPORINE (RESTASIS) 0.05 % ophthalmic emulsion Place 1 drop into both eyes 2 (two) times daily.   Yes [provider]  ENSURE PLUS (ENSURE PLUS) LIQD Take 237 mLs by mouth 2 (two) times daily between meals.   Yes [provider]  Liniments (SALONPAS EX) Apply 1 patch  topically as needed (for pain).   Yes [provider]  naproxen sodium (ALEVE) 220 MG tablet Take 220 mg by mouth daily as needed (pain).   Yes [provider]  oxyCODONE (OXY IR/ROXICODONE) 5 MG immediate release tablet Take 1 tablet (5 mg total) by mouth 3 (three) times daily as needed for moderate pain. 03/20/18  Yes Paz, Alda Berthold, MD  senna (SENOKOT) 8.6 MG TABS tablet Take 1 tablet (8.6 mg total) by mouth 2 (two) times daily. Patient taking differently: Take 1 tablet by mouth daily as needed for mild constipation.  04/12/16  Yes Hosie Poisson, MD  tiZANidine (ZANAFLEX) 4 MG tablet Take 1 tablet (4 mg total) every 6 (six) hours as needed by mouth for muscle spasms. 05/12/17  Yes Colon Branch, MD     Allergies:     Allergies  Allergen Reactions  . Other Other (See Comments)    5FU cream - stroke like symptoms  . Bacitracin-Polymyxin B Other (See Comments)    Reaction unknown.   Marland Kitchen Penicillins Rash     Physical Exam:   Vitals  Blood pressure (!) 150/81, pulse (!) 101, temperature 97.8 F (36.6 C), temperature source Oral, resp. rate (!) 24, SpO2 97 %.   1. General , frail elderly female, laying in bed in no apparent distress  2. Normal affect and insight, Not Suicidal or Homicidal, Awake Alert, Oriented X 3.  3. No F.N deficits, ALL C.Nerves Intact, overall generally weak, lower extremity 4 out of 5 bilaterally.  4. Ears and Eyes appear Normal, Conjunctivae clear, PERRLA.  Dry oral mucosa  5. Supple Neck, No JVD,  No Carotid Bruits.  6. Symmetrical Chest wall movement, Good air movement bilaterally, CTAB.  7.  Tachycardic, No Gallops, Rubs or Murmurs, No Parasternal Heave.  8. Positive Bowel Sounds, Abdomen Soft, No tenderness, No organomegaly appriciated,No rebound -guarding or rigidity.  9.  No Cyanosis, leg skin Turgor, does have thoracic area pressure ulcer  10. Good muscle tone,  joints appear normal , no effusions, Normal ROM.  He has +1 edema  bilaterally  11. No Palpable Lymph Nodes in Neck or Axillae     Data Review:    CBC Recent Labs  Lab 03/28/18 2025  WBC 11.7*  HGB 12.5  HCT 37.0  PLT 259  MCV 95.6  MCH 32.3  MCHC 33.8  RDW 12.8  LYMPHSABS 0.5*  MONOABS 0.8  EOSABS 0.0  BASOSABS 0.0   ------------------------------------------------------------------------------------------------------------------  Chemistries  Recent Labs  Lab 03/28/18 2025  NA 136  K 4.7  CL 97*  CO2 27  GLUCOSE 115*  BUN 30*  CREATININE 0.86  CALCIUM 11.4*  AST 19  ALT 13  ALKPHOS 62  BILITOT 1.2   ------------------------------------------------------------------------------------------------------------------ CrCl cannot be calculated (Unknown ideal weight.). ------------------------------------------------------------------------------------------------------------------ No results for input(s): TSH, T4TOTAL, T3FREE, THYROIDAB in the last 72 hours.  Invalid input(s): FREET3  Coagulation profile No results for input(s): INR, PROTIME in the last 168 hours. ------------------------------------------------------------------------------------------------------------------- No results for input(s): DDIMER in the last 72 hours. -------------------------------------------------------------------------------------------------------------------  Cardiac Enzymes No results for input(s): CKMB, TROPONINI, MYOGLOBIN in the last 168 hours.  Invalid input(s): CK ------------------------------------------------------------------------------------------------------------------    Component Value Date/Time   BNP 72.5 04/02/2016 1723     ---------------------------------------------------------------------------------------------------------------  Urinalysis    Component Value Date/Time   COLORURINE YELLOW 03/28/2018 2025   APPEARANCEUR HAZY (A) 03/28/2018 2025   LABSPEC >1.030 (H) 03/28/2018 2025   PHURINE 5.5  03/28/2018 2025   GLUCOSEU NEGATIVE 03/28/2018 2025   HGBUR TRACE (A) 03/28/2018 2025   HGBUR trace 03/04/2008 1529   BILIRUBINUR SMALL (A) 03/28/2018 2025   KETONESUR 15 (A) 03/28/2018 2025   PROTEINUR 30 (A) 03/28/2018 2025   UROBILINOGEN 0.2 10/23/2010 0505   NITRITE NEGATIVE 03/28/2018 2025   LEUKOCYTESUR TRACE (A) 03/28/2018 2025    ----------------------------------------------------------------------------------------------------------------   Imaging Results:    Ct Cervical Spine Wo Contrast  Result Date: 03/28/2018 CLINICAL DATA:  Fall EXAM: CT CERVICAL SPINE WITHOUT CONTRAST TECHNIQUE: Multidetector CT imaging of the cervical spine was performed without intravenous contrast. Multiplanar CT image reconstructions were also generated. COMPARISON:  CT 04/02/2016 FINDINGS: Alignment: Study is limited by patient positioning. No subluxation. Facet alignment within normal limits. Skull base and vertebrae: No definitive fracture Soft tissues and spinal canal: No prevertebral fluid or swelling. No visible canal hematoma. Disc levels: Moderate diffuse degenerative change C4 through T1. Mild degenerative change at C3-C4. Upper chest: Lung apices are clear Other: Multiple bilateral upper rib lytic lesions with associated soft tissue masses. Pathologic fracture right first rib. This is a known finding. IMPRESSION: 1. Limited study secondary to kyphotic patient and positioning. No gross fracture seen 2. Multiple lytic lesion within the visible bilateral ribs with associated soft tissue masses, which may reflect metastatic disease or history of myeloma. Chronic pathologic fracture of the right first rib. Electronically Signed   By: Donavan Foil M.D.   On: 03/28/2018 22:22   Ct Thoracic Spine Wo Contrast  Result Date: 03/28/2018 CLINICAL DATA:  Fall getting out of wheelchair. Patient reports chronic upper back pain, no new pain since fall. EXAM: CT THORACIC SPINE WITHOUT CONTRAST TECHNIQUE:  Multidetector CT images of the thoracic were obtained using the standard protocol without intravenous contrast. COMPARISON:  Chest radiograph 05/17/2016.  Chest CT 04/02/2016 FINDINGS: Alignment: Severe thoracic kyphosis centered at T8-T9 with 82 degrees kyphosis, distorting normal anatomy. Vertebrae: Vertebra plana appearance of T8 and T9 vertebral bodies at site of prior expansile lytic spine lesions, prior lesions are not well demonstrated. Multiple new lytic lesions throughout the thoracic and included lumbar spine. New dominant lesion involving T12 near completely replaces the vertebral body with cortical destruction anteriorly, mild loss of height consistent with pathologic fracture. Large T11 lytic lesions involves the left greater than right transverse processes,  expansile with large soft tissue component, left greater than right. Soft tissue component invades the spinal canal. Lytic lesion involving L1 near completely replaces the vertebral body. L2 lesion involves the right transverse process with mass effect on the spinal canal. Additional lesion involving T7 vertebral body. Paraspinal and other soft tissues: There are innumerable lytic rib lesions with soft tissue components. Largest in dominant lesion involves the left fifth rib with large soft tissue component extending into the lung parenchyma. Additional lesions involving left first, second, and third ribs. Expansile lesion involving the right fifth rib at the costovertebral junction. Additional lesion involving anterior right third rib, posterior right first rib with pathologic fracture. Small left pleural effusion is partially included. Disc levels: Near complete disc space loss at T8 and T9 due to vertebral plana appearance of the vertebral bodies. Multilevel disc space narrowing and endplate spurring. IMPRESSION: 1. Innumerable destructive and expansile lytic lesions throughout the thoracic and included upper lumbar spine, new from 2017. There is  varying degree of spinal canal involvement, which is most severe at T11 secondary to expansile transverse process lesions. Previous expansile lesions at T8 and T9 now have a vertebral plana appearance. Additionally there are numerable rib lesions. Given history of plasmacytoma, findings likely represent multiple myeloma. Recommend correlation with prior workup. 2. New pathologic compression fractures of T11 and T12 with mild loss of height. 3. Severe thoracic kyphosis centered at T8-T9 at 82 degrees. 4. If greater detail is required to evaluate the canal, recommend thoracic spine MRI. Electronically Signed   By: Melanie  Sanford M.D.   On: 03/28/2018 22:35    My personal review of EKG: Rhythm NSR, Rate  106 /min, QTc 401 , no Acute ST changes   Assessment & Plan:    Active Problems:   Pathologic vertebral fracture   Plasmacytoma ([facility])   Chronic kidney disease   Carotid artery stenosis   Failure to thrive in adult   Generalized weakness   Acute cystitis with hematuria   Failure to thrive/ambulatory dysfunction -Patient presents with progressive decline of functional capacity, currently she is wheelchair dependent, with multiple falls recently, will very poor oral intake, and fluid intake as well is most likely in the setting of worsening plasmacytoma, with compression fracture, baseline severe kyphosis, and UTI. - will consult PT -Consult nutritionist  History of plasmacytoma -He is been followed by Dr. Feng as outpatient, patient did not follow for oncology for more than 1 year, did receive radiation treatment in the past, given her  fraility , she did not receive any further therapy. -CT cervical/thoracic spine showing multiple destructive expansile lytic lesions throughout the thoracic spine and new pathology and compression fracture of T11 and T12 with mild loss of high, I have discussed with oncology, who will evaluate the patient, and await further recommendation. -For  now continue  with PRN pain medication, consult PT  UTI -Reports urinary symptoms, will start on Rocephin, follow urine cultures  CKD  stage III -Creatinine at baseline, continue with gentle hydration  thoracic Pressure ulcer -Consult wound care  Constipation -We will start on good laxative regimen    DVT Prophylaxis Heparin - SCDs   AM Labs Ordered, also please review Full Orders  Family Communication: Admission, patients condition and plan of care including tests being ordered have been discussed with the patient daughter who indicate understanding and agree with the plan and Code Status.  Code Status DNR, confirmed by the patient, daughter at bedside  Likely DC to  :   pending PT  Condition GUARDED    Consults called: Oncology,Dr Burr Medico will see  Admission status: observation  Time spent in minutes : 55 miutes   Phillips Climes M.D on 03/29/2018 at 9:39 AM  Between 7am to 7pm - Pager - [phone]. After 7pm go to [email or web address] - password [email or web address]  [facility] - Office  [phone]

## 2018-03-29 NOTE — Progress Notes (Signed)
I have reviewed and concur with this student's documentation.   

## 2018-03-29 NOTE — Progress Notes (Signed)
Mikayla Marks   DOB:10-Sep-1927   TD#:[id number]   TGG#:[id number]  Hem/Onc follow up   Subjective: Pt was previously seen by me, last visit in 06/2016.  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  Systemic therapy was not recommended due to her age and medical comorbidities, especially schizophrenia.  She lost follow-up since her last visit.  She presented with worsening back pain, bilateral lower extremity weakness, not able to ambulate at home.  In the ED, CT scan showed diffuse lytic bone lesions in her spine and ribs, most consistent with multiple myeloma.  Objective:  Vitals:   03/29/18 1532 03/29/18 1640  BP: 116/63 119/63  Pulse: 98 99  Resp: (!) 22 16  Temp: 98.9 F (37.2 C) 99 F (37.2 C)  SpO2: 94% 95%    Body mass index is 19 kg/m.  Intake/Output Summary (Last 24 hours) at 03/29/2018 1809 Last data filed at 03/29/2018 1600 Gross per 24 hour  Intake 694.72 ml  Output -  Net 694.72 ml     Sclerae unicteric  Oropharynx clear  No peripheral adenopathy  Lungs clear -- no rales or rhonchi  Heart regular rate and rhythm  Abdomen benign  MSK: mild mid thoracic spine and low back tenderness, no peripheral edema  Neuro nonfocal    CBG (last 3)  No results for input(s): GLUCAP in the last 72 hours.   Labs:  Lab Results  Component Value Date   WBC 9.3 03/29/2018   HGB 11.6 (L) 03/29/2018   HCT 34.8 (L) 03/29/2018   MCV 97.8 03/29/2018   PLT 276 03/29/2018   NEUTROABS 10.3 (H) 03/28/2018   CMP Latest Ref Rng & Units 03/29/2018 03/28/2018 07/26/2017  Glucose 70 - 99 mg/dL - 115(H) 86  BUN 8 - 23 mg/dL - 30(H) 23  Creatinine 0.44 - 1.00 mg/dL 0.70 0.86 0.82  Sodium 135 - 145 mmol/L - 136 139  Potassium 3.5 - 5.1 mmol/L - 4.7 4.2  Chloride 98 - 111 mmol/L - 97(L) 104  CO2 22 - 32 mmol/L - 27 28  Calcium 8.9 - 10.3 mg/dL - 11.4(H) 9.4  Total Protein 6.5 - 8.1 g/dL - 7.5 7.2  Total Bilirubin 0.3 - 1.2 mg/dL - 1.2 0.5   Alkaline Phos 38 - 126 U/L - 62 66  AST 15 - 41 U/L - 19 19  ALT 0 - 44 U/L - 13 12    Urine Studies No results for input(s): UHGB, CRYS in the last 72 hours.  Invalid input(s): UACOL, UAPR, USPG, UPH, UTP, UGL, UKET, UBIL, UNIT, UROB, ULEU, UEPI, UWBC, URBC, UBAC, CAST, UCOM, BILUA  Basic Metabolic Panel: Recent Labs  Lab 03/28/18 2025 03/29/18 1045  NA 136  --   K 4.7  --   CL 97*  --   CO2 27  --   GLUCOSE 115*  --   BUN 30*  --   CREATININE 0.86 0.70  CALCIUM 11.4*  --    GFR Estimated Creatinine Clearance: 41 mL/min (by C-G formula based on SCr of 0.7 mg/dL). Liver Function Tests: Recent Labs  Lab 03/28/18 2025  AST 19  ALT 13  ALKPHOS 62  BILITOT 1.2  PROT 7.5  ALBUMIN 3.5   No results for input(s): LIPASE, AMYLASE in the last 168 hours. No results for input(s): AMMONIA in the last 168 hours. Coagulation profile No results for input(s): INR, PROTIME in the last 168 hours.  CBC: Recent Labs  Lab 03/28/18 2025  03/29/18 1045  WBC 11.7* 9.3  NEUTROABS 10.3*  --   HGB 12.5 11.6*  HCT 37.0 34.8*  MCV 95.6 97.8  PLT 259 276   Cardiac Enzymes: No results for input(s): CKTOTAL, CKMB, CKMBINDEX, TROPONINI in the last 168 hours. BNP: Invalid input(s): POCBNP CBG: No results for input(s): GLUCAP in the last 168 hours. D-Dimer No results for input(s): DDIMER in the last 72 hours. Hgb A1c No results for input(s): HGBA1C in the last 72 hours. Lipid Profile No results for input(s): CHOL, HDL, LDLCALC, TRIG, CHOLHDL, LDLDIRECT in the last 72 hours. Thyroid function studies No results for input(s): TSH, T4TOTAL, T3FREE, THYROIDAB in the last 72 hours.  Invalid input(s): FREET3 Anemia work up No results for input(s): VITAMINB12, FOLATE, FERRITIN, TIBC, IRON, RETICCTPCT in the last 72 hours. Microbiology No results found for this or any previous visit (from the past 240 hour(s)). PATHOLOGY REPORT Diagnosis 04/04/2016 Bone, biopsy, Thoracic 9 - PLASMA  CELL NEOPLASM, SEE COMMENT. Microscopic Comment The bone biopsy reveals sheets of atypical plasma cells, which are positive for CD138. By light chain in situ hybridization the plasma cells are kappa restricted. The case was called to Dr. Burr Medico on 04/06/2016.  Studies:  Ct Cervical Spine Wo Contrast  Result Date: 03/28/2018 CLINICAL DATA:  Fall EXAM: CT CERVICAL SPINE WITHOUT CONTRAST TECHNIQUE: Multidetector CT imaging of the cervical spine was performed without intravenous contrast. Multiplanar CT image reconstructions were also generated. COMPARISON:  CT 04/02/2016 FINDINGS: Alignment: Study is limited by patient positioning. No subluxation. Facet alignment within normal limits. Skull base and vertebrae: No definitive fracture Soft tissues and spinal canal: No prevertebral fluid or swelling. No visible canal hematoma. Disc levels: Moderate diffuse degenerative change C4 through T1. Mild degenerative change at C3-C4. Upper chest: Lung apices are clear Other: Multiple bilateral upper rib lytic lesions with associated soft tissue masses. Pathologic fracture right first rib. This is a known finding. IMPRESSION: 1. Limited study secondary to kyphotic patient and positioning. No gross fracture seen 2. Multiple lytic lesion within the visible bilateral ribs with associated soft tissue masses, which may reflect metastatic disease or history of myeloma. Chronic pathologic fracture of the right first rib. Electronically Signed   By: Donavan Foil M.D.   On: 03/28/2018 22:22   Ct Thoracic Spine Wo Contrast  Result Date: 03/28/2018 CLINICAL DATA:  Fall getting out of wheelchair. Patient reports chronic upper back pain, no new pain since fall. EXAM: CT THORACIC SPINE WITHOUT CONTRAST TECHNIQUE: Multidetector CT images of the thoracic were obtained using the standard protocol without intravenous contrast. COMPARISON:  Chest radiograph 05/17/2016.  Chest CT 04/02/2016 FINDINGS: Alignment: Severe thoracic kyphosis  centered at T8-T9 with 82 degrees kyphosis, distorting normal anatomy. Vertebrae: Vertebra plana appearance of T8 and T9 vertebral bodies at site of prior expansile lytic spine lesions, prior lesions are not well demonstrated. Multiple new lytic lesions throughout the thoracic and included lumbar spine. New dominant lesion involving T12 near completely replaces the vertebral body with cortical destruction anteriorly, mild loss of height consistent with pathologic fracture. Large T11 lytic lesions involves the left greater than right transverse processes, expansile with large soft tissue component, left greater than right. Soft tissue component invades the spinal canal. Lytic lesion involving L1 near completely replaces the vertebral body. L2 lesion involves the right transverse process with mass effect on the spinal canal. Additional lesion involving T7 vertebral body. Paraspinal and other soft tissues: There are innumerable lytic rib lesions with soft tissue components. Largest in dominant  lesion involves the left fifth rib with large soft tissue component extending into the lung parenchyma. Additional lesions involving left first, second, and third ribs. Expansile lesion involving the right fifth rib at the costovertebral junction. Additional lesion involving anterior right third rib, posterior right first rib with pathologic fracture. Small left pleural effusion is partially included. Disc levels: Near complete disc space loss at T8 and T9 due to vertebral plana appearance of the vertebral bodies. Multilevel disc space narrowing and endplate spurring. IMPRESSION: 1. Innumerable destructive and expansile lytic lesions throughout the thoracic and included upper lumbar spine, new from 2017. There is varying degree of spinal canal involvement, which is most severe at T11 secondary to expansile transverse process lesions. Previous expansile lesions at T8 and T9 now have a vertebral plana appearance. Additionally there  are numerable rib lesions. Given history of plasmacytoma, findings likely represent multiple myeloma. Recommend correlation with prior workup. 2. New pathologic compression fractures of T11 and T12 with mild loss of height. 3. Severe thoracic kyphosis centered at T8-T9 at 82 degrees. 4. If greater detail is required to evaluate the canal, recommend thoracic spine MRI. Electronically Signed   By: Keith Rake M.D.   On: 03/28/2018 22:35    Assessment: 84 y.o. with a past medical history significant for paranoid schizophrenia and HTNwho presents with worsening back pain for one year, much worse for the past 6 months.   1.  Story of plasmacytoma involving T8 and T9, progressed to multiple myeloma now 2.  Pathological vertebral fracture 2. Schizophrenia 3. Hypertension 4. Back pain secondary to #1 5.  Failure to thrive at home  Plan:  -I have reviewed her CT scan findings, which is consistent with multiple myeloma.  -Labs showed mild anemia and elevated calcium at 11.4, which are probably all related to multiple myeloma -will repeat Calcium level, if still high tomorrow, will give IV biphosphonate (Zometa if renal function normal) -She has deteriorated significantly lately, not able to ambulate independently.  Given her poor performance status, advanced age, medical comorbidities, especially schizophrenia, lack of adequate social support (she lives with her daughter, who has mental illness also), she is not a candidate for MM systemic treatment. I will consult rad/onc Dr. Tammi Klippel to see if palliative RT to T11-12 is beneficial -I otherwise will recommend hospice, please consult palliative care -We need to contact her son Dr. Wendelyn Breslow (lives out state) and brother [name] (local) to make the final decision about her discharge planning.  Her son [name] has significant mental illness, cannot be her Media planner.  I have called Dr. Deedra Ehrich today, updated her current condition, but did not have  enough time to discuss hospice since discharge planning.  I will call him again tomorrow.  I will follow up.   Truitt Merle, MD 03/29/2018  6:09 PM

## 2018-03-29 NOTE — Progress Notes (Signed)
Handoff  given to Vickie, RN at bedside. VSS. No complaints.

## 2018-03-29 NOTE — Consult Note (Signed)
[facility] Nurse wound consult note Reason for Consult: Nonhealing unstageable pressure injuries to thoracic spine. Present on admission Wound type:nonhealing pressure injuries Pressure Injury POA: Yes/ Measurement:Distal:  1 cm x 1 cm 100% fibrin slough Proximal:  0.5 cm x 0.5 cm 100% fibrin slough. Located on bony prominences of thoracic spine.  Wound bed:100% fibrin slough Drainage (amount, consistency, odor) minimal purulent No odor Periwound:intact  Patient with kyphosis and pressure applied to this area.  Aware to turn and reposition Dressing procedure/placement/frequency:Cleanse back wounds with NS .  Apply SAntyl to wound bed.  Cover with NS moist gauze.  Secure with silicone foam.  Change daily.  Send home with patient.  Will not follow at this time.  Please re-consult if needed.  Domenic Moras MSN, RN, FNP-BC [facility] Wound, Ostomy, Continence Nurse Pager [phone]

## 2018-03-29 NOTE — Progress Notes (Signed)
Initial Nutrition Assessment  DOCUMENTATION CODES:   Non-severe (moderate) malnutrition in context of chronic illness  INTERVENTION:    Ensure Enlive po BID, each supplement provides 350 kcal and 20 grams of protein  Magic cup TID with meals, each supplement provides 290 kcal and 9 grams of protein  Provide MVI daily  Pt complaining of swallowing issues, recommend SLP consult.   NUTRITION DIAGNOSIS:   Moderate Malnutrition related to chronic illness(kyphosis/non healing wounds) as evidenced by energy intake < 75% for > or equal to 1 month, severe muscle depletion, moderate fat depletion.  GOAL:   Patient will meet greater than or equal to 90% of their needs  MONITOR:   PO intake, Supplement acceptance, Weight trends, I & O's, Labs  REASON FOR ASSESSMENT:   Consult Assessment of nutrition requirement/status  ASSESSMENT:   Patient with PMH significant for HTN, HLD, osteoporosis, paranoid schizophrenia, and severe kyphosis. Presents this admission after a mechanical fall at home. Admitted for failure to thrive and ambulatory dysfunction.    Pt reports having a decreased in appetite over the last 2 months due to worsening feelings of weakness. States during this time period she would consume mashed potatoes, green peas, Ensure, soups, and sweet potatoes. Pt endorses with her kyphosis, it makes it difficult to swallow in the position she is in. This is why she chooses softer foods. Pt may benefit from SLP consult. Pt had breakfast at bedside with a couple bites of pancakes completed. RN provided pt with water and PO medication to which she said went down fine. Daughter had a lot of questions regarding how to make pt gain weight. Discussed the importance of protein intake for preservation of lean body mass and to aid with wound healing. Encouraged daughter to provide pt with high calorie high protein supplement like Ensure.   Daughter endorses pt's UBW stays around 160 lb the last  time being at that weight two years ago. Records indicate pt weighed 128 lb on 07/26/17 and 117 lb this admission (8.6% wt loss in 10 months, insignificant for time frame). Nutrition-Focused physical exam completed.   Medications reviewed and include: miralax, senokot  Labs reviewed: Calcium 11.4 (H)   NUTRITION - FOCUSED PHYSICAL EXAM:    Most Recent Value  Orbital Region  Mild depletion  Upper Arm Region  Moderate depletion  Thoracic and Lumbar Region  Unable to assess  Buccal Region  Moderate depletion  Temple Region  Mild depletion  Clavicle Bone Region  Severe depletion  Clavicle and Acromion Bone Region  Severe depletion  Scapular Bone Region  Unable to assess  Dorsal Hand  Mild depletion  Patellar Region  No depletion  Anterior Thigh Region  No depletion  Posterior Calf Region  No depletion  Edema (RD Assessment)  Mild     Diet Order:   Diet Order            Diet regular Room service appropriate? Yes; Fluid consistency: Thin  Diet effective now              EDUCATION NEEDS:   Education needs have been addressed  Skin:  Skin Assessment: Skin Integrity Issues: Skin Integrity Issues:: Unstageable Unstageable: Nonhealing PIs to thoracic spine.  Last BM:  03/29/18  Height:   Ht Readings from Last 1 Encounters:  03/29/18 5\' 6"  (1.676 m)    Weight:   Wt Readings from Last 1 Encounters:  03/29/18 53.4 kg    Ideal Body Weight:  59.1 kg  BMI:  Body mass index is 19 kg/m.  Estimated Nutritional Needs:   Kcal:  1650-1850 kcal  Protein:  80-95 grams   Fluid:  >/= 1.6 L/day  Mariana Single RD, LDN Clinical Nutrition Pager # - [phone]

## 2018-03-29 NOTE — Evaluation (Signed)
Physical Therapy Evaluation Patient Details Name: Mikayla Marks MRN: [id number] DOB: 1928-02-19 Today's Date: 03/29/2018   History of Present Illness  84 yo female admitted with FTT. Sustained a fall at home. Imaging (+) compression fxs T11,T12 and innumberable lytic lesions T-L spine. Hx of schizoaffective d/o, osteoporosis, CKD, chronic pain, severe kyphosis    Clinical Impression  On eval, pt required Max assist for bed mobility on today. She sat EOB for ~4-5 minutes with Min guard-Min assist for balance. Pt is severely kyphotic. She fatigues easily with minimal activity. She declined any attempt for OOB activity on today due to fatigue. Discussed d/c plan with pt and daughter-they are unsure at the moment however daughter would like to be able to take pt back home and continue caring for her. At this time, recommendation is for SNF if pt and daughter are agreeable. If they decline placement, recommend HHPT, hospital bed, and PTAR transport home.     Follow Up Recommendations SNF(HHPT and 24/7 care if pt/daughter decline placement)    Equipment Recommendations  Hospital bed    Recommendations for Other Services       Precautions / Restrictions Precautions Precautions: Fall Restrictions Weight Bearing Restrictions: No      Mobility  Bed Mobility Overal bed mobility: Needs Assistance Bed Mobility: Supine to Sit;Sit to Supine     Supine to sit: Max assist;HOB elevated Sit to supine: Max assist;HOB elevated   General bed mobility comments: Assist for trunk and bil LEs. Utilized bedpad for "helicopter" method to get pt to EOB. Increased time.   Transfers                 General transfer comment: NT-pt declined to attempt standing  Ambulation/Gait             General Gait Details: nonambulatory  Stairs            Wheelchair Mobility    Modified Rankin (Stroke Patients Only)       Balance Overall balance assessment: Needs  assistance Sitting-balance support: Bilateral upper extremity supported;Feet supported;Feet unsupported Sitting balance-Leahy Scale: Fair                                       Pertinent Vitals/Pain Pain Assessment: Faces Faces Pain Scale: Hurts even more Pain Location: back Pain Descriptors / Indicators: Grimacing;Discomfort Pain Intervention(s): Limited activity within patient's tolerance;Repositioned    Home Living Family/patient expects to be discharged to:: Unsure Living Arrangements: Children(lives with daughter [name]) Available Help at Discharge: Family;Available 24 hours/day Type of Home: House Home Access: Stairs to enter Entrance Stairs-Rails: None Entrance Stairs-Number of Steps: 1 Home Layout: One level Home Equipment: Walker - 4 wheels;Wheelchair - manual      Prior Function Level of Independence: Needs assistance   Gait / Transfers Assistance Needed: essentially nonambulatory. able to pivot with assistance  ADL's / Homemaking Assistance Needed: assist needed for bathing, dressing        Hand Dominance        Extremity/Trunk Assessment   Upper Extremity Assessment Upper Extremity Assessment: Generalized weakness(difficult to completely assess due to severe kyphosis)    Lower Extremity Assessment Lower Extremity Assessment: Generalized weakness(difficult to completely assess due to severe kyphosis)    Cervical / Trunk Assessment Cervical / Trunk Assessment: Kyphotic  Communication   Communication: No difficulties  Cognition Arousal/Alertness: Awake/alert Behavior During Therapy: WFL for tasks assessed/performed Overall  Cognitive Status: Impaired/Different from baseline Area of Impairment: Memory                                      General Comments      Exercises     Assessment/Plan    PT Assessment Patient needs continued PT services  PT Problem List Decreased mobility;Decreased strength;Decreased activity  tolerance;Decreased range of motion;Decreased balance;Decreased knowledge of use of DME;Pain       PT Treatment Interventions DME instruction;Functional mobility training;Therapeutic activities;Gait training;Therapeutic exercise;Balance training;Patient/family education    PT Goals (Current goals can be found in the Care Plan section)  Acute Rehab PT Goals Patient Stated Goal: per daughter-home PT Goal Formulation: With family Time For Goal Achievement: 04/12/18 Potential to Achieve Goals: Fair    Frequency Min 2X/week   Barriers to discharge        Co-evaluation               AM-PAC PT "6 Clicks" Daily Activity  Outcome Measure Difficulty turning over in bed (including adjusting bedclothes, sheets and blankets)?: Unable Difficulty moving from lying on back to sitting on the side of the bed? : Unable Difficulty sitting down on and standing up from a chair with arms (e.g., wheelchair, bedside commode, etc,.)?: Unable Help needed moving to and from a bed to chair (including a wheelchair)?: Total Help needed walking in hospital room?: Total Help needed climbing 3-5 steps with a railing? : Total 6 Click Score: 6    End of Session   Activity Tolerance: Patient limited by fatigue;Patient limited by pain Patient left: in bed;with call bell/phone within reach;with bed alarm set;with family/visitor present   PT Visit Diagnosis: Muscle weakness (generalized) (M62.81);Other abnormalities of gait and mobility (R26.89);Adult, failure to thrive (R62.7)    Time: 4332-9518 PT Time Calculation (min) (ACUTE ONLY): 52 min   Charges:   PT Evaluation $PT Eval Moderate Complexity: 1 Mod PT Treatments $Therapeutic Activity: 23-37 mins          Weston Anna, PT Acute Rehabilitation Services Pager: [phone] Office: [phone]

## 2018-03-29 NOTE — Assessment & Plan Note (Signed)
Failure to thrive Since the last office visit she has markedly deteriorated. Her kyphosis is much worse making eating very difficult.  She also is getting constipated, the daughter reports that often times she has stools in the rectum but is so weak she cannot push the stools out. I attempted - with the help of 2 nurses- to transfer pt on a table to inspect her perineum (pressure ulcers?)  And do a rectal exam (r/o impaction) but the daughter got nervous and felt like pt could not tolerate that. In summary, she is not doing well,  I think she needs further evaluation. -X-rays to evaluate her thoracic and cervical spine -Rule out fecal impaction  -Also I would recommend an admission, she is deteriorating and going forward she will need more help at home. Discussed with the ER physician who agreed to saw to see her.

## 2018-03-30 DIAGNOSIS — N183 Chronic kidney disease, stage 3 (moderate): Secondary | ICD-10-CM

## 2018-03-30 DIAGNOSIS — Z515 Encounter for palliative care: Secondary | ICD-10-CM

## 2018-03-30 DIAGNOSIS — Z7189 Other specified counseling: Secondary | ICD-10-CM

## 2018-03-30 LAB — URINE CULTURE: Culture: <10000 — AB

## 2018-03-30 LAB — BASIC METABOLIC PANEL
ANION GAP: 9 (ref 5–15)
BUN: 28 mg/dL — AB (ref 8–23)
CHLORIDE: 105 mmol/L (ref 98–111)
CO2: 27 mmol/L (ref 22–32)
Calcium: 10.5 mg/dL — ABNORMAL HIGH (ref 8.9–10.3)
Creatinine, Ser: 0.71 mg/dL (ref 0.44–1.00)
GFR calc non Af Amer: >60 mL/min (ref >60)
Glucose, Bld: 92 mg/dL (ref 70–99)
POTASSIUM: 4.3 mmol/L (ref 3.5–5.1)
SODIUM: 141 mmol/L (ref 135–145)

## 2018-03-30 LAB — CBC
HEMATOCRIT: 32.8 % — AB (ref 36.0–46.0)
HEMOGLOBIN: 10.7 g/dL — AB (ref 12.0–15.0)
MCH: 32.1 pg (ref 26.0–34.0)
MCHC: 32.6 g/dL (ref 30.0–36.0)
MCV: 98.5 fL (ref 78.0–100.0)
Platelets: 247 10*3/uL (ref 150–400)
RBC: 3.33 MIL/uL — AB (ref 3.87–5.11)
RDW: 13.4 % (ref 11.5–15.5)
WBC: 8.1 10*3/uL (ref 4.0–10.5)

## 2018-03-30 MED ORDER — DOCUSATE SODIUM 100 MG PO CAPS
100.0000 mg | ORAL_CAPSULE | Freq: Two times a day (BID) | ORAL | Status: DC
  Administered 2018-03-31 – 2018-04-03 (×8): 100 mg via ORAL
  Filled 2018-03-30 (×9): qty 1

## 2018-03-30 NOTE — Progress Notes (Signed)
[facility]  PROGRESS NOTE  Mikayla Marks AJG:[id number] DOB: March 09, 1928 DOA: 03/28/2018 PCP: Colon Branch, MD   Brief HPI:   84 year old female with a history of paranoid schizophrenia, hypertension, severe kyphosis, plasmacytoma was brought to the ED by daughter for poor appetite, reduced oral intake and progressive functional decline over the past few months.  In the ED CT of the cervical and thoracic spine showed multiple destructive expansile lytic lesion throughout the thoracic spine and new pathology and compression fracture of T11 and 12 with mild loss of height.    Subjective   This morning patient is sitting comfortably denies any pain.  Daughter at bedside.   Assessment/Plan:     1. 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  At this time comfort measures, pain control and Perative care has been consulted.  2. Pathological vertebral fracture-seen on CT thoracic spine she has pathological fracture involving T11 and T12 with mild loss of height.  Continue pain control.  3. UTI-patient started on empiric Rocephin, urine culture from 10/2-19 showed insignificant growth.  Will discontinue Rocephin at this time.  4. Chronic kidney disease stage III-creatinine at baseline.  5. Constipation-continue Senokot tablets, will also add Colace 100 mg p.o. twice daily     CBG: No results for input(s): GLUCAP in the last 168 hours.  CBC: Recent Labs  Lab 03/28/18 2025 03/29/18 1045 03/30/18 0353  WBC 11.7* 9.3 8.1  NEUTROABS 10.3*  --   --   HGB 12.5 11.6* 10.7*  HCT 37.0 34.8* 32.8*  MCV 95.6 97.8 98.5  PLT 259 276 355    Basic Metabolic Panel: Recent Labs  Lab 03/28/18 2025 03/29/18 1045 03/30/18 0353  NA 136  --  141  K 4.7  --  4.3  CL 97*  --  105  CO2 27  --  27  GLUCOSE 115*  --  92  BUN 30*  --  28*  CREATININE  0.86 0.70 0.71  CALCIUM 11.4*  --  10.5*     DVT prophylaxis: Heparin  Code Status:  DNR  Family Communication: No family at bedside  Disposition Plan: likely home when medically ready for discharge   Consultants:  Oncology  Palliative care  Procedures:  None   Antibiotics:   Anti-infectives (From admission, onward)   Start     Dose/Rate Route Frequency Ordered Stop   03/29/18 1000  cefTRIAXone (ROCEPHIN) 1 g in sodium chloride 0.9 % 100 mL IVPB     1 g 200 mL/hr over 30 Minutes Intravenous Every 24 hours 03/29/18 0820         Objective   Vitals:   03/29/18 1532 03/29/18 1640 03/29/18 2101 03/30/18 0448  BP: 116/63 119/63 137/78 128/65  Pulse: 98 99 (!) 104 93  Resp: (!) 22 16 20 20   Temp: 98.9 F (37.2 C) 99 F (37.2 C) 97.8 F (36.6 C) 98 F (36.7 C)  TempSrc: Oral Oral Oral Oral  SpO2: 94% 95% 97% 94%  Weight:      Height:        Intake/Output Summary (Last 24 hours) at 03/30/2018 1606 Last data filed at 03/30/2018 1153 Gross per 24 hour  Intake 120 ml  Output 1 ml  Net 119 ml   Filed Weights   03/29/18 1104  Weight: 53.4 kg     Physical Examination:    General: Appears  in no acute distress  Cardiovascular: S1-S2, regular, no murmurs auscultated  Respiratory: Clear to auscultation bilaterally  Abdomen: Soft, nontender, no organomegaly  Extremities: No edema of the lower extremities  Neurologic: Alert oriented x3, no focal deficit noted     Data Reviewed: I have personally reviewed following labs and imaging studies   Recent Results (from the past 240 hour(s))  Urine culture     Status: Abnormal   Collection Time: 03/28/18  8:42 PM  Result Value Ref Range Status   Specimen Description   Final    URINE, CLEAN CATCH Performed at [facility], [address] [place], [place] [address]    Special Requests   Final    NONE Performed at [facility], [address] [place], [place] [address]     Culture (A)  Final    <10,000 COLONIES/mL INSIGNIFICANT GROWTH Performed at [facility], [place] [address], [place], [place] [address]    Report Status 03/30/2018 FINAL  Final     Liver Function Tests: Recent Labs  Lab 03/28/18 2025  AST 19  ALT 13  ALKPHOS 62  BILITOT 1.2  PROT 7.5  ALBUMIN 3.5   No results for input(s): LIPASE, AMYLASE in the last 168 hours. No results for input(s): AMMONIA in the last 168 hours.  Cardiac Enzymes: No results for input(s): CKTOTAL, CKMB, CKMBINDEX, TROPONINI in the last 168 hours. BNP (last 3 results) No results for input(s): BNP in the last 8760 hours.  ProBNP (last 3 results) No results for input(s): PROBNP in the last 8760 hours.    Studies: Ct Cervical Spine Wo Contrast  Result Date: 03/28/2018 CLINICAL DATA:  Fall EXAM: CT CERVICAL SPINE WITHOUT CONTRAST TECHNIQUE: Multidetector CT imaging of the cervical spine was performed without intravenous contrast. Multiplanar CT image reconstructions were also generated. COMPARISON:  CT 04/02/2016 FINDINGS: Alignment: Study is limited by patient positioning. No subluxation. Facet alignment within normal limits. Skull base and vertebrae: No definitive fracture Soft tissues and spinal canal: No prevertebral fluid or swelling. No visible canal hematoma. Disc levels: Moderate diffuse degenerative change C4 through T1. Mild degenerative change at C3-C4. Upper chest: Lung apices are clear Other: Multiple bilateral upper rib lytic lesions with associated soft tissue masses. Pathologic fracture right first rib. This is a known finding. IMPRESSION: 1. Limited study secondary to kyphotic patient and positioning. No gross fracture seen 2. Multiple lytic lesion within the visible bilateral ribs with associated soft tissue masses, which may reflect metastatic disease or history of myeloma. Chronic pathologic fracture of the right first rib. Electronically Signed   By: Donavan Foil M.D.   On: 03/28/2018 22:22    Ct Thoracic Spine Wo Contrast  Result Date: 03/28/2018 CLINICAL DATA:  Fall getting out of wheelchair. Patient reports chronic upper back pain, no new pain since fall. EXAM: CT THORACIC SPINE WITHOUT CONTRAST TECHNIQUE: Multidetector CT images of the thoracic were obtained using the standard protocol without intravenous contrast. COMPARISON:  Chest radiograph 05/17/2016.  Chest CT 04/02/2016 FINDINGS: Alignment: Severe thoracic kyphosis centered at T8-T9 with 82 degrees kyphosis, distorting normal anatomy. Vertebrae: Vertebra plana appearance of T8 and T9 vertebral bodies at site of prior expansile lytic spine lesions, prior lesions are not well demonstrated. Multiple new lytic lesions throughout the thoracic and included lumbar spine. New dominant lesion involving T12 near completely replaces the vertebral body with cortical destruction anteriorly, mild loss of height consistent with pathologic fracture. Large T11 lytic lesions involves  the left greater than right transverse processes, expansile with large soft tissue component, left greater than right. Soft tissue component invades the spinal canal. Lytic lesion involving L1 near completely replaces the vertebral body. L2 lesion involves the right transverse process with mass effect on the spinal canal. Additional lesion involving T7 vertebral body. Paraspinal and other soft tissues: There are innumerable lytic rib lesions with soft tissue components. Largest in dominant lesion involves the left fifth rib with large soft tissue component extending into the lung parenchyma. Additional lesions involving left first, second, and third ribs. Expansile lesion involving the right fifth rib at the costovertebral junction. Additional lesion involving anterior right third rib, posterior right first rib with pathologic fracture. Small left pleural effusion is partially included. Disc levels: Near complete disc space loss at T8 and T9 due to vertebral plana appearance  of the vertebral bodies. Multilevel disc space narrowing and endplate spurring. IMPRESSION: 1. Innumerable destructive and expansile lytic lesions throughout the thoracic and included upper lumbar spine, new from 2017. There is varying degree of spinal canal involvement, which is most severe at T11 secondary to expansile transverse process lesions. Previous expansile lesions at T8 and T9 now have a vertebral plana appearance. Additionally there are numerable rib lesions. Given history of plasmacytoma, findings likely represent multiple myeloma. Recommend correlation with prior workup. 2. New pathologic compression fractures of T11 and T12 with mild loss of height. 3. Severe thoracic kyphosis centered at T8-T9 at 82 degrees. 4. If greater detail is required to evaluate the canal, recommend thoracic spine MRI. Electronically Signed   By: Keith Rake M.D.   On: 03/28/2018 22:35    Scheduled Meds: . collagenase   Topical Daily  . cycloSPORINE  1 drop Both Eyes BID  . docusate sodium  100 mg Oral BID  . feeding supplement (ENSURE ENLIVE)  237 mL Oral BID BM  . heparin  5,000 Units Subcutaneous Q8H  . multivitamin with minerals  1 tablet Oral Daily  . polyethylene glycol  34 g Oral BID  . senna  1 tablet Oral BID      Time spent: 25 min  [facility] Pager [phone]. If 7PM-7AM, please contact night-coverage at [email or web address], Office  [phone]  password Diagonal  03/30/2018, 4:06 PM  LOS: 1 day

## 2018-03-30 NOTE — Progress Notes (Signed)
Mikayla Marks   DOB:26-Feb-1928   JJ#:[id number]   KZS#:[id number]  Hem/Onc follow up   Subjective: Pt is clinical stable, still has significant back pain, received pain meds and was sleepy when I saw her. She is arousable, and answers questions appropriately.  Her daughter [name] was in the room, and had numerous questions.   Objective:  Vitals:   03/30/18 0448 03/30/18 1635  BP: 128/65 (!) 146/82  Pulse: 93 (!) 111  Resp: 20 16  Temp: 98 F (36.7 C) 98 F (36.7 C)  SpO2: 94% 94%    Body mass index is 19 kg/m.  Intake/Output Summary (Last 24 hours) at 03/30/2018 1804 Last data filed at 03/30/2018 1245 Gross per 24 hour  Intake 430 ml  Output 1 ml  Net 429 ml     Sclerae unicteric  Oropharynx clear  No peripheral adenopathy  Lungs clear -- no rales or rhonchi  Heart regular rate and rhythm  Abdomen benign  MSK: mild mid thoracic spine and low back tenderness, no peripheral edema  Neuro nonfocal    CBG (last 3)  No results for input(s): GLUCAP in the last 72 hours.   Labs:  Lab Results  Component Value Date   WBC 8.1 03/30/2018   HGB 10.7 (L) 03/30/2018   HCT 32.8 (L) 03/30/2018   MCV 98.5 03/30/2018   PLT 247 03/30/2018   NEUTROABS 10.3 (H) 03/28/2018   CMP Latest Ref Rng & Units 03/30/2018 03/29/2018 03/28/2018  Glucose 70 - 99 mg/dL 92 - 115(H)  BUN 8 - 23 mg/dL 28(H) - 30(H)  Creatinine 0.44 - 1.00 mg/dL 0.71 0.70 0.86  Sodium 135 - 145 mmol/L 141 - 136  Potassium 3.5 - 5.1 mmol/L 4.3 - 4.7  Chloride 98 - 111 mmol/L 105 - 97(L)  CO2 22 - 32 mmol/L 27 - 27  Calcium 8.9 - 10.3 mg/dL 10.5(H) - 11.4(H)  Total Protein 6.5 - 8.1 g/dL - - 7.5  Total Bilirubin 0.3 - 1.2 mg/dL - - 1.2  Alkaline Phos 38 - 126 U/L - - 62  AST 15 - 41 U/L - - 19  ALT 0 - 44 U/L - - 13    Urine Studies No results for input(s): UHGB, CRYS in the last 72 hours.  Invalid input(s): UACOL, UAPR, USPG, UPH, UTP, UGL, UKET, UBIL, UNIT, UROB, ULEU, UEPI, UWBC, URBC, UBAC, CAST, UCOM,  BILUA  Basic Metabolic Panel: Recent Labs  Lab 03/28/18 2025 03/29/18 1045 03/30/18 0353  NA 136  --  141  K 4.7  --  4.3  CL 97*  --  105  CO2 27  --  27  GLUCOSE 115*  --  92  BUN 30*  --  28*  CREATININE 0.86 0.70 0.71  CALCIUM 11.4*  --  10.5*   GFR Estimated Creatinine Clearance: 41 mL/min (by C-G formula based on SCr of 0.71 mg/dL). Liver Function Tests: Recent Labs  Lab 03/28/18 2025  AST 19  ALT 13  ALKPHOS 62  BILITOT 1.2  PROT 7.5  ALBUMIN 3.5   No results for input(s): LIPASE, AMYLASE in the last 168 hours. No results for input(s): AMMONIA in the last 168 hours. Coagulation profile No results for input(s): INR, PROTIME in the last 168 hours.  CBC: Recent Labs  Lab 03/28/18 2025 03/29/18 1045 03/30/18 0353  WBC 11.7* 9.3 8.1  NEUTROABS 10.3*  --   --   HGB 12.5 11.6* 10.7*  HCT 37.0 34.8* 32.8*  MCV 95.6 97.8 98.5  PLT 259 276 247   Cardiac Enzymes: No results for input(s): CKTOTAL, CKMB, CKMBINDEX, TROPONINI in the last 168 hours. BNP: Invalid input(s): POCBNP CBG: No results for input(s): GLUCAP in the last 168 hours. D-Dimer No results for input(s): DDIMER in the last 72 hours. Hgb A1c No results for input(s): HGBA1C in the last 72 hours. Lipid Profile No results for input(s): CHOL, HDL, LDLCALC, TRIG, CHOLHDL, LDLDIRECT in the last 72 hours. Thyroid function studies No results for input(s): TSH, T4TOTAL, T3FREE, THYROIDAB in the last 72 hours.  Invalid input(s): FREET3 Anemia work up No results for input(s): VITAMINB12, FOLATE, FERRITIN, TIBC, IRON, RETICCTPCT in the last 72 hours. Microbiology Recent Results (from the past 240 hour(s))  Urine culture     Status: Abnormal   Collection Time: 03/28/18  8:42 PM  Result Value Ref Range Status   Specimen Description   Final    URINE, CLEAN CATCH Performed at [facility], [address] [place], [place] [address]    Special Requests   Final    NONE Performed at [facility], [address] [place], [place] [address]    Culture (A)  Final    <10,000 COLONIES/mL INSIGNIFICANT GROWTH Performed at [facility], [place] [address], [place], [place] [address]    Report Status 03/30/2018 FINAL  Final   PATHOLOGY REPORT Diagnosis 04/04/2016 Bone, biopsy, Thoracic 9 - PLASMA CELL NEOPLASM, SEE COMMENT. Microscopic Comment The bone biopsy reveals sheets of atypical plasma cells, which are positive for CD138. By light chain in situ hybridization the plasma cells are kappa restricted. The case was called to Dr. Burr Medico on 04/06/2016.  Studies:  Ct Cervical Spine Wo Contrast  Result Date: 03/28/2018 CLINICAL DATA:  Fall EXAM: CT CERVICAL SPINE WITHOUT CONTRAST TECHNIQUE: Multidetector CT imaging of the cervical spine was performed without intravenous contrast. Multiplanar CT image reconstructions were also generated. COMPARISON:  CT 04/02/2016 FINDINGS: Alignment: Study is limited by patient positioning. No subluxation. Facet alignment within normal limits. Skull base and vertebrae: No definitive fracture Soft tissues and spinal canal: No prevertebral fluid or swelling. No visible canal hematoma. Disc levels: Moderate diffuse degenerative change C4 through T1. Mild degenerative change at C3-C4. Upper chest: Lung apices are clear Other: Multiple bilateral upper rib lytic lesions with associated soft tissue masses. Pathologic fracture right first rib. This is a known finding. IMPRESSION: 1. Limited study secondary to kyphotic patient and positioning. No gross fracture seen 2. Multiple lytic lesion within the visible bilateral ribs with associated soft tissue masses, which may reflect metastatic disease or history of myeloma. Chronic pathologic fracture of the right first rib. Electronically Signed   By: Donavan Foil M.D.   On: 03/28/2018 22:22   Ct Thoracic Spine Wo Contrast  Result Date: 03/28/2018 CLINICAL DATA:  Fall getting out of wheelchair. Patient  reports chronic upper back pain, no new pain since fall. EXAM: CT THORACIC SPINE WITHOUT CONTRAST TECHNIQUE: Multidetector CT images of the thoracic were obtained using the standard protocol without intravenous contrast. COMPARISON:  Chest radiograph 05/17/2016.  Chest CT 04/02/2016 FINDINGS: Alignment: Severe thoracic kyphosis centered at T8-T9 with 82 degrees kyphosis, distorting normal anatomy. Vertebrae: Vertebra plana appearance of T8 and T9 vertebral bodies at site of prior expansile lytic spine lesions, prior lesions are not well demonstrated. Multiple new lytic lesions throughout the thoracic and included lumbar spine. New dominant lesion involving T12 near completely replaces the vertebral body with cortical destruction anteriorly, mild loss  of height consistent with pathologic fracture. Large T11 lytic lesions involves the left greater than right transverse processes, expansile with large soft tissue component, left greater than right. Soft tissue component invades the spinal canal. Lytic lesion involving L1 near completely replaces the vertebral body. L2 lesion involves the right transverse process with mass effect on the spinal canal. Additional lesion involving T7 vertebral body. Paraspinal and other soft tissues: There are innumerable lytic rib lesions with soft tissue components. Largest in dominant lesion involves the left fifth rib with large soft tissue component extending into the lung parenchyma. Additional lesions involving left first, second, and third ribs. Expansile lesion involving the right fifth rib at the costovertebral junction. Additional lesion involving anterior right third rib, posterior right first rib with pathologic fracture. Small left pleural effusion is partially included. Disc levels: Near complete disc space loss at T8 and T9 due to vertebral plana appearance of the vertebral bodies. Multilevel disc space narrowing and endplate spurring. IMPRESSION: 1. Innumerable destructive  and expansile lytic lesions throughout the thoracic and included upper lumbar spine, new from 2017. There is varying degree of spinal canal involvement, which is most severe at T11 secondary to expansile transverse process lesions. Previous expansile lesions at T8 and T9 now have a vertebral plana appearance. Additionally there are numerable rib lesions. Given history of plasmacytoma, findings likely represent multiple myeloma. Recommend correlation with prior workup. 2. New pathologic compression fractures of T11 and T12 with mild loss of height. 3. Severe thoracic kyphosis centered at T8-T9 at 82 degrees. 4. If greater detail is required to evaluate the canal, recommend thoracic spine MRI. Electronically Signed   By: Keith Rake M.D.   On: 03/28/2018 22:35    Assessment: 84 y.o. with a past medical history significant for paranoid schizophrenia and HTNwho presents with worsening back pain for one year, much worse for the past 6 months.   1.  History of plasmacytoma involving T8 and T9, progressed to multiple myeloma now with diffuse bone lesions  2.  Pathological vertebral fracture 2. Schizophrenia 3. Hypertension 4. Back pain secondary to #1 5.  Failure to thrive at home  Plan:  -I have asked radiation oncologist Dr. Tammi Klippel to review her scan, to see if she is a candidate for palliative radiation for her pain.  Unfortunately, due to her diffuse lytic bone lesions, very poor performance status, palliative radiation has limited benefit and is not recommended  -Have previously discussed that she is not a candidate for chemotherapy, the goal of therapy is palliative and comfort.  Patient and her children (daughter [name] and son) all agree.  -I have spoken with pt about her discharge planning.  Due to her advanced age, significant pain issue, and limited mobility, she likely needs a lot of care.  I think skilled nursing home with hospice on board is the best option for her.  Patient was drowsy  from pain medication, however she clearly said that she wants to go to SNF. This was witnessed by her nurse Jocelyn Lamer.  However her daughter repeatedly say this is not what her mother's wish, and she want her mother to go home with her. -I spoke with patient's son Dr. Deedra Ehrich, who plans to come down here to see her this weekend. -I have spoken with palliative care Dr. Rowe Pavy, will continue discuss with patient and her family members. -although her calcium level has improved, she is likely going to have hypercalcemia again after discharge.  During her diffuse bone metastasis, I will  recommend 1 dose of Zometa 4 mg infusion before discharge. -I think she would benefit from low-dose steroids, for her pain control, diffuse bone lesions, and some disease control for multiple myeloma.  However she does have schizophrenia, not sure if she can tolerate.  I think we can try 2 to 4 mg daily when she is in hospital.  -pain management per palliative care team, appreciate their input   I will follow up.   Truitt Merle, MD 03/30/2018  6:04 PM

## 2018-03-30 NOTE — Consult Note (Signed)
Consultation Note Date: 03/30/2018   Patient Name: Mikayla Marks  DOB: 1927/12/03  MRN: [id number]  Age / Sex: 84 y.o., female  PCP: Colon Branch, MD Referring Physician: Oswald Hillock, MD  Reason for Consultation: Establishing goals of care  HPI/Patient Profile: 84 y.o. female  admitted on 03/28/2018   Clinical Assessment and Goals of Care:  84 year old lady known to palliative service, seen by my colleague Ms. Bullard in 2017. At that time, the patient was diagnosed with plasmacytoma. She was found to have disease burden in thoracic 8 and 9 vertebrae.  Patient lives at home with [name]. Patient has been admitted to hospital medicine service this time with fall, weakness, inability to ambulate at home, declining appetite, worsening back discomfort.  Workup in the emergency department this time showed a CT scan with diffuse lytic bone lesions in spine and ribs most consistent with multiple myeloma. Patient has been seen and evaluated by medical oncology, primary concern is that the patient has now developed multiple myeloma. Because of her frailty and her overall condition, deemed not to be an appropriate candidate anymore for any kind of systemic treatment. It is being consider whether patient will benefit from a radiation attempted to her thoracic vertebrae.  Palliative consult for ongoing goals of care discussions and for addressing the patient's safest possible disposition option, goals wishes in values.  Patient is an elderly lady resting in bed. [name] is present at the bedside. I introduced myself and palliative care as follows: Palliative medicine is specialized medical care for people living with serious illness. It focuses on providing relief from the symptoms and stress of a serious illness. The goal is to improve quality of life for both the patient and the family.  Chart has  been reviewed. The patient's daughter Mikayla Marks did most of the conversation, information providing. Patient however is completely alert and oriented and fully able to participate in making her own decisions and providing most all aspects of her own history. The initial consultation was made a little challenging by the fact that Mikayla Marks continue to answer for the patient.  The patient understands the serious, potential incurable nature of her illness. We discussed about appropriate disposition options. Patient would prefer skilled nursing facility and rehabilitation attempt. She did not seem eager to go back home with her daughter.  [name], on the other hand, continue to talk about how the patient not to come live with her, she stated that she is the patient's primary caregiver, she stated that the patient's other relatives including the patient's brother and patient's son have not seen her in months. She states that the patient has had home physical therapy before that did not work out well. I discussed frankly and compassionately with Mikayla Marks that we would simply have to follow the patient's hospital disease trajectory and make the best possible disposition decisions primarily following the patient's wishes.  Continue current scope of pain management. Continue efforts with physical therapy participation, efforts with increasing oral intake. Will follow  along. Thank you for the consult.  NEXT OF KIN  Patient has a brother, she lives with [name], her son who is a Engineer, drilling lives in [place]. She has one other daughter who is an Therapist, sports.   SUMMARY OF RECOMMENDATIONS   Agree with DNR.  Await Rad onc input and recommendations Encourage PO, PT participation.  Continue current mode of care.  Recommend SNF rehab with palliative to follow on discharge.  Thank you for the consult.   Code Status/Advance Care Planning:  DNR    Symptom Management:     As above  Palliative Prophylaxis:   Delirium  Protocol   Psycho-social/Spiritual:   Desire for further Chaplaincy support:yes  Additional Recommendations: Caregiving  Support/Resources  Prognosis:   Unable to determine  Discharge Planning: To Be Determined      Primary Diagnoses: Present on Admission: . Failure to thrive in adult . Carotid artery stenosis . Chronic Marks disease . Pathologic vertebral fracture . Plasmacytoma ([place])   I have reviewed the medical record, interviewed the patient and family, and examined the patient. The following aspects are pertinent.  Past Medical History:  Diagnosis Date  . Carotid artery stenosis   . Chronic Marks disease   . Hyperlipidemia   . HYPERTENSION   . Osteoporosis   . Paranoid schizophrenia ([place])   . SCC (squamous cell carcinoma)    in situ R pretibial area   Social History   Socioeconomic History  . Marital status: Single    Spouse name: Not on file  . Number of children: 3  . Years of education: Not on file  . Highest education level: Not on file  Occupational History  . 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 Financial resource strain: Not on file  . Food insecurity:    Worry: Not on file    Inability: Not on file  . Transportation needs:    Medical: Not on file    Non-medical: Not on file  Tobacco Use  . Smoking status: Never Smoker  . Smokeless tobacco: Never Used  Substance and Sexual Activity  . Alcohol use: No    Alcohol/week: 0.0 standard drinks  . Drug use: No  . Sexual activity: Never  Lifestyle  . Physical activity:    Days per week: Not on file    Minutes per session: Not on file  . Stress: Not on file  Relationships  . Social connections:    Talks on phone: Not on file    Gets together: Not on file    Attends religious service: Not on file    Active member of club or organization: Not on file    Attends meetings of clubs or organizations: Not on file    Relationship status: Not on file  Other Topics Concern  . Not on file    Social History Narrative   Daughter lives with her Mikayla Marks) ; pt is  widow   Brother Mikayla Marks in [place], lives 5 min away [phone]      Family History  Problem Relation Age of Onset  . Heart Problems Mother   . Diabetes Mother   . Colon cancer Neg Hx   . Breast cancer Neg Hx   . CAD Neg Hx    Scheduled Meds: . collagenase   Topical Daily  . cycloSPORINE  1 drop Both Eyes BID  . docusate sodium  100 mg Oral BID  . feeding supplement (ENSURE ENLIVE)  237 mL Oral BID BM  .  heparin  5,000 Units Subcutaneous Q8H  . multivitamin with minerals  1 tablet Oral Daily  . polyethylene glycol  34 g Oral BID  . senna  1 tablet Oral BID   Continuous Infusions: . cefTRIAXone (ROCEPHIN)  IV 1 g (03/30/18 0955)   PRN Meds:.acetaminophen **OR** acetaminophen, albuterol, magnesium citrate, oxyCODONE Medications Prior to Admission:  Prior to Admission medications   Medication Sig Start Date End Date Taking? Authorizing Provider  cycloSPORINE (RESTASIS) 0.05 % ophthalmic emulsion Place 1 drop into both eyes 2 (two) times daily.   Yes [provider]  ENSURE PLUS (ENSURE PLUS) LIQD Take 237 mLs by mouth 2 (two) times daily between meals.   Yes [provider]  Liniments (SALONPAS EX) Apply 1 patch topically as needed (for pain).   Yes [provider]  naproxen sodium (ALEVE) 220 MG tablet Take 220 mg by mouth daily as needed (pain).   Yes [provider]  oxyCODONE (OXY IR/ROXICODONE) 5 MG immediate release tablet Take 1 tablet (5 mg total) by mouth 3 (three) times daily as needed for moderate pain. 03/20/18  Yes Paz, Alda Berthold, MD  senna (SENOKOT) 8.6 MG TABS tablet Take 1 tablet (8.6 mg total) by mouth 2 (two) times daily. Patient taking differently: Take 1 tablet by mouth daily as needed for mild constipation.  04/12/16  Yes Hosie Poisson, MD  tiZANidine (ZANAFLEX) 4 MG tablet Take 1 tablet (4 mg total) every 6 (six) hours as needed by mouth for muscle spasms.  05/12/17  Yes Colon Branch, MD   Allergies  Allergen Reactions  . Other Other (See Comments)    5FU cream - stroke like symptoms  . Bacitracin-Polymyxin B Other (See Comments)    Reaction unknown.   Marland Kitchen Penicillins Rash   Review of Systems +weakness  Physical Exam Weak appearing lady Resting in bed Sclerae anicteric    Lungs clear -- no rales or rhonchi    Heart regular rate and rhythm   Abdomen benign   MSK: mild mid thoracic spine and low back tenderness, no peripheral edema    Neuro non focal.  Vital Signs: BP 128/65 (BP Location: Left Arm)   Pulse 93   Temp 98 F (36.7 C) (Oral)   Resp 20   Ht 5' 6" (1.676 m)   Wt 53.4 kg   SpO2 94%   BMI 19.00 kg/m  Pain Scale: 0-10   Pain Score: Asleep   SpO2: SpO2: 94 % O2 Device:SpO2: 94 % O2 Flow Rate: .   IO: Intake/output summary:   Intake/Output Summary (Last 24 hours) at 03/30/2018 1349 Last data filed at 03/30/2018 1153 Gross per 24 hour  Intake 540.41 ml  Output 1 ml  Net 539.41 ml    LBM: Last BM Date: 03/29/18 Baseline Weight: Weight: 53.4 kg Most recent weight: Weight: 53.4 kg     Palliative Assessment/Data:   PPS 40%  Time In:  12 Time Out:  1300 Time Total:  60 min  Greater than 50%  of this time was spent counseling and coordinating care related to the above assessment and plan.  Signed by: Loistine Chance, MD [id number]   Please contact Palliative Medicine Team phone at [phone] for questions and concerns.  For individual provider: See Shea Evans

## 2018-03-30 NOTE — Progress Notes (Addendum)
Physical Therapy Treatment Patient Details Name: Mikayla Marks MRN: [id number] DOB: [birth year] Today's Date: 03/30/2018    History of Present Illness 84 yo female admitted with FTT. Sustained a fall at home. Imaging (+) compression fxs T11,T12 and innumberable lytic lesions T-L spine. Hx of schizoaffective d/o, osteoporosis, CKD, chronic pain, severe kyphosis    PT Comments    Daughter present during session. Progressing slowly with mobility. Mobility is limited by pain, weakness.  Continue to recommend SNF if pt/family will agree. Will continue to follow during hospital stay.  Follow Up Recommendations  SNF (HHPT and 24/7 care if pt/daughter decline placement)     Equipment Recommendations  Hospital bed    Recommendations for Other Services       Precautions / Restrictions Precautions Precautions: Fall Restrictions Weight Bearing Restrictions: No    Mobility  Bed Mobility Overal bed mobility: Needs Assistance Bed Mobility: Supine to Sit;Sit to Supine     Supine to sit: Total assist;+2 for physical assistance;+2 for safety/equipment Sit to supine: Total assist;+2 for physical assistance;+2 for safety/equipment   General bed mobility comments: Assist for trunk and bil LEs. Utilized bedpad for "helicopter" method to get pt to EOB. Increased time.   Transfers Overall transfer level: Needs assistance Equipment used: Rolling walker (2 wheeled) Transfers: Sit to/from [facility] Sit to Stand: Mod assist;+2 physical assistance;+2 safety/equipment Stand pivot transfers: Mod assist;+2 physical assistance;+2 safety/equipment       General transfer comment: Assist to rise, stabilize, control descent. Multimodal cueing for safety, technique, hand placement. Stand pivot x 2, bed<>bsc, with RW. Increased time.   Ambulation/Gait             General Gait Details: nonambulatory   Stairs             Wheelchair Mobility    Modified Rankin  (Stroke Patients Only)       Balance Overall balance assessment: Needs assistance Sitting-balance support: Bilateral upper extremity supported;Feet supported Sitting balance-Leahy Scale: Fair     Standing balance support: Bilateral upper extremity supported Standing balance-Leahy Scale: Poor                              Cognition Arousal/Alertness: Awake/alert Behavior During Therapy: WFL for tasks assessed/performed Overall Cognitive Status: Impaired/Different from baseline Area of Impairment: Memory                               General Comments: pt also made some strange statements during session ("government forcing [place] to move b/c of radiation")      Exercises      General Comments        Pertinent Vitals/Pain Pain Assessment: Faces Faces Pain Scale: Hurts even more Pain Location: back, legs Pain Descriptors / Indicators: Grimacing;Discomfort Pain Intervention(s): Limited activity within patient's tolerance;Monitored during session;Repositioned    Home Living                      Prior Function            PT Goals (current goals can now be found in the care plan section) Progress towards PT goals: Progressing toward goals    Frequency    Min 2X/week      PT Plan Current plan remains appropriate    Co-evaluation              AM-PAC  PT "6 Clicks" Daily Activity  Outcome Measure  Difficulty turning over in bed (including adjusting bedclothes, sheets and blankets)?: Unable Difficulty moving from lying on back to sitting on the side of the bed? : Unable Difficulty sitting down on and standing up from a chair with arms (e.g., wheelchair, bedside commode, etc,.)?: Unable Help needed moving to and from a bed to chair (including a wheelchair)?: A Lot Help needed walking in hospital room?: A Lot Help needed climbing 3-5 steps with a railing? : Total 6 Click Score: 8    End of Session Equipment Utilized During  Treatment: Gait belt Activity Tolerance: Patient limited by fatigue;Patient limited by pain Patient left: in bed;with call bell/phone within reach;with bed alarm set;with family/visitor present   PT Visit Diagnosis: Muscle weakness (generalized) (M62.81);Other abnormalities of gait and mobility (R26.89);Adult, failure to thrive (R62.7)     Time: 1638-4536 PT Time Calculation (min) (ACUTE ONLY): 54 min  Charges:  $Therapeutic Activity: 23-37 mins                        Weston Anna, PT Acute Rehabilitation Services Pager: [phone] Office: [phone]

## 2018-03-30 NOTE — Evaluation (Signed)
Occupational Therapy Evaluation Patient Details Name: Mikayla Marks MRN: [id number] DOB: [birth year] Today's Date: 03/30/2018    History of Present Illness 84 yo female admitted with FTT. Sustained a fall at home. Imaging (+) compression fxs T11,T12 and innumberable lytic lesions T-L spine. Hx of schizoaffective d/o, osteoporosis, CKD, chronic pain, severe kyphosis   Clinical Impression   Pt was admitted for the above.  Daughter was assisting pt with adls prior to admission but she needs more assistance at this time.  Also she was able to take a few steps to toilet and shower at baseline.  Pt needs total +2 assistance for bed mob, mod +2 for sit to stand during transfers and during adls. Will follow with min to mod A level goals in acute setting. Pt would benefit from SNSNF    Follow Up Recommendations  SNF(If pt refuses, then [facility])    Equipment Recommendations  3 in 1 bedside commode    Recommendations for Other Services       Precautions / Restrictions Precautions Precautions: Fall Restrictions Weight Bearing Restrictions: No      Mobility Bed Mobility Overal bed mobility: Needs Assistance Bed Mobility: Supine to Sit;Sit to Supine     Supine to sit: Total assist;+2 for physical assistance;+2 for safety/equipment Sit to supine: Total assist;+2 for physical assistance;+2 for safety/equipment   General bed mobility comments: Assist for trunk and bil LEs. Utilized bedpad for "helicopter" method to get pt to EOB. Increased time.   Transfers Overall transfer level: Needs assistance Equipment used: Rolling walker (2 wheeled) Transfers: Sit to/from [facility] Sit to Stand: Mod assist;+2 physical assistance;+2 safety/equipment Stand pivot transfers: Mod assist;+2 physical assistance;+2 safety/equipment       General transfer comment: Assist to rise, stabilize, control descent. Multimodal cueing for safety, technique, hand placement. Stand pivot x 2,  bed<>recliner, with RW. Increased time.     Balance Overall balance assessment: Needs assistance Sitting-balance support: Bilateral upper extremity supported;Feet supported Sitting balance-Leahy Scale: Fair     Standing balance support: Bilateral upper extremity supported Standing balance-Leahy Scale: Poor                             ADL either performed or assessed with clinical judgement   ADL Overall ADL's : Needs assistance/impaired Eating/Feeding: Minimal assistance;Sitting Eating/Feeding Details (indicate cue type and reason): daughter assisted so she would eat more Grooming: Minimal assistance;Sitting   Upper Body Bathing: Moderate assistance   Lower Body Bathing: Maximal assistance;+2 for safety/equipment;+2 for physical assistance   Upper Body Dressing : Moderate assistance   Lower Body Dressing: Total assistance;Sit to/from stand;+2 for safety/equipment;+2 for physical assistance   Toilet Transfer: Moderate assistance;+2 for physical assistance;Stand-pivot;BSC;RW   Toileting- Clothing Manipulation and Hygiene: Total assistance;+2 for physical assistance;+2 for safety/equipment;Sit to/from stand         General ADL Comments: used commode then back to bed. Pt with increased effort:  encouraged her not to strain. Daughter also encouraging her to do this.  Reviewed 3:1 commode chair with daughter and explained uses.  Pt is not at a level where she can step into bathroom at this time.  Daughter helped with adls prior to admission     Vision         Perception     Praxis      Pertinent Vitals/Pain Pain Assessment: Faces Faces Pain Scale: Hurts even more Pain Location: back, legs Pain Descriptors / Indicators: Grimacing;Discomfort Pain  Intervention(s): Monitored during session;Limited activity within patient's tolerance;Repositioned     Hand Dominance     Extremity/Trunk Assessment Upper Extremity Assessment Upper Extremity Assessment:  Generalized weakness(R stronger than L)           Communication Communication Communication: No difficulties   Cognition Arousal/Alertness: Awake/alert Behavior During Therapy: WFL for tasks assessed/performed Overall Cognitive Status: Impaired/Different from baseline Area of Impairment: Memory                               General Comments: pt also made some strange statements during session ("government forcing [place] to move b/c of radiation")   General Comments       Exercises     Shoulder Instructions      Home Living Family/patient expects to be discharged to:: Unsure Living Arrangements: Children Available Help at Discharge: Family;Available 24 hours/day                         Home Equipment: Walker - 2 wheels;Wheelchair - manual;Toilet riser   Additional Comments: walk in shower with 2 low built in seats.  Uses transport chair to bathroom door then few steps to toilet      Prior Functioning/Environment      ADL's / Homemaking Assistance Needed: assist needed for bathing, dressing            OT Problem List: Decreased strength;Decreased activity tolerance;Impaired balance (sitting and/or standing);Decreased knowledge of use of DME or AE;Pain      OT Treatment/Interventions: Self-care/ADL training;DME and/or AE instruction;Patient/family education;Balance training;Therapeutic activities;Energy conservation    OT Goals(Current goals can be found in the care plan section) Acute Rehab OT Goals Patient Stated Goal: per daughter-home OT Goal Formulation: With patient/family Time For Goal Achievement: 04/13/18 Potential to Achieve Goals: Good ADL Goals Pt Will Transfer to Toilet: with min assist;bedside commode;stand pivot transfer Additional ADL Goal #1: pt will go from sit to stand with min "A and maintain for 2 minutes at this level during adls Additional ADL Goal #2: pt will perform bed mobility from hospital bed with [facility] raised with  mod A in preparation for adls  OT Frequency: Min 2X/week   Barriers to D/C:            Co-evaluation PT/OT/SLP Co-Evaluation/Treatment: Yes Reason for Co-Treatment: For patient/therapist safety PT goals addressed during session: Mobility/safety with mobility OT goals addressed during session: ADL's and self-care      AM-PAC PT "6 Clicks" Daily Activity     Outcome Measure Help from another person eating meals?: A Little Help from another person taking care of personal grooming?: A Little Help from another person toileting, which includes using toliet, bedpan, or urinal?: A Lot Help from another person bathing (including washing, rinsing, drying)?: A Lot Help from another person to put on and taking off regular upper body clothing?: A Lot Help from another person to put on and taking off regular lower body clothing?: Total 6 Click Score: 13   End of Session    Activity Tolerance: Patient tolerated treatment well Patient left: in bed;with call bell/phone within reach;with bed alarm set;with family/visitor present  OT Visit Diagnosis: Unsteadiness on feet (R26.81);Muscle weakness (generalized) (M62.81);History of falling (Z91.81)                Time: 4098-1191 OT Time Calculation (min): 58 min Charges:  OT General Charges $OT Visit: 1 Visit OT Evaluation $OT  Eval Moderate Complexity: 1 Mod OT Treatments $Self Care/Home Management : 8-22 mins  Lesle Chris, OTR/L Acute Rehabilitation Services [phone] [facility] pager [phone] office 03/30/2018  Jerol Rufener 03/30/2018, 12:37 PM

## 2018-03-31 DIAGNOSIS — E440 Moderate protein-calorie malnutrition: Secondary | ICD-10-CM

## 2018-03-31 MED ORDER — SODIUM CHLORIDE 0.9 % IV SOLN: INTRAVENOUS | Status: DC | PRN

## 2018-03-31 MED ORDER — OXYCODONE HCL 5 MG PO TABS
5.0000 mg | ORAL_TABLET | Freq: Once | ORAL | Status: AC
  Administered 2018-03-31: 5 mg via ORAL

## 2018-03-31 MED ORDER — OXYCODONE HCL 5 MG PO TABS
5.0000 mg | ORAL_TABLET | Freq: Once | ORAL | Status: AC
  Administered 2018-03-31: 5 mg via ORAL
  Filled 2018-03-31: qty 1

## 2018-03-31 MED ORDER — DEXAMETHASONE SODIUM PHOSPHATE 10 MG/ML IJ SOLN
2.0000 mg | Freq: Two times a day (BID) | INTRAMUSCULAR | Status: DC
  Administered 2018-03-31 – 2018-04-03 (×7): 2 mg via INTRAVENOUS
  Filled 2018-03-31 (×8): qty 1

## 2018-03-31 MED ORDER — SODIUM CHLORIDE 0.9 % IV SOLN
INTRAVENOUS | Status: DC | PRN
  Administered 2018-03-31 – 2018-04-03 (×4): via INTRAVENOUS

## 2018-03-31 NOTE — NC FL2 (Deleted)
[place] LEVEL OF CARE SCREENING TOOL     IDENTIFICATION  Patient Name: Mikayla Marks Birthdate: Oct 26, 1927 Sex: female Admission Date (Current Location): 03/28/2018  [place] and [place] Number:   [facility] and Address:  [facility]      Provider Number: [id number]  Attending Physician Name and Address:  Oswald Hillock, MD  Relative Name and Phone Number:  Baldo Ash Daughter [phone]  [phone]     Current Level of Care: Hospital Recommended Level of Care: [facility] Prior Approval Number:    Date Approved/Denied:   PASRR Number: Pending  Discharge Plan: SNF    Current Diagnoses: Patient Active Problem List   Diagnosis Date Noted  . Encounter for palliative care   . Goals of care, counseling/discussion   . Generalized weakness 03/29/2018  . Acute cystitis with hematuria 03/29/2018  . Malnutrition of moderate degree 03/29/2018  . Failure to thrive (0-17)   . Multiple myeloma not having achieved remission ([place])   . Failure to thrive in adult 03/28/2018  . Dry eyes, bilateral 04/17/2016  . Chronic kidney disease   . Carotid artery stenosis   . Spinal cord compression ([place])   . Prolonged QT interval 04/03/2016  . Palliative care encounter   . Intractable back pain 04/02/2016  . 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 Pathologic vertebral fracture 11/12/2015  . Dyspnea 11/11/2015  . Pleural effusion, bilateral 11/11/2015  . PCP NOTES >>>>>>>>>>>>>>>>>>>>>>>>>>>>>>>> 10/26/2015  . Hammer toe of left foot 01/30/2015  . Bilateral leg edema 09/10/2014  . Onychomycosis 05/19/2014  . Pain in lower limb 05/19/2014  . Fatigue 02/11/2014  . Medicare annual wellness visit, subsequent 09/02/2013  . Pain in joint, foot, left 07/10/2013  . Corn of toe 02/04/2013  . Varicose veins of lower extremities with inflammation 11/20/2012  . DEGENERATIVE JOINT DISEASE 01/12/2010  . Schizophrenia ([place]) 06/30/2009  .  SKIN CANCER, LEG 05/29/2009  . Osteoporosis 08/26/2008  . HYPERLIPIDEMIA 10/16/2006  . Essential hypertension 10/16/2006    Orientation RESPIRATION BLADDER Height & Weight     Self, Time, Situation, Place  Normal Continent Weight: 117 lb 11.6 oz (53.4 kg) Height:  _0  (167.6 cm)  BEHAVIORAL SYMPTOMS/MOOD NEUROLOGICAL BOWEL NUTRITION STATUS      Continent Diet(Regular diet)  AMBULATORY STATUS COMMUNICATION OF NEEDS Skin   Limited Assist Verbally Surgical wounds                       Personal Care Assistance Level of Assistance  Bathing, Feeding, Dressing Bathing Assistance: Limited assistance Feeding assistance: Independent Dressing Assistance: Limited assistance     Functional Limitations Info  Sight, Hearing, Speech Sight Info: Adequate Hearing Info: Adequate Speech Info: Adequate    SPECIAL CARE FACTORS FREQUENCY  PT (By licensed PT), OT (By licensed OT)     PT Frequency: 5x a week OT Frequency: 5x a week            Contractures      Additional Factors Info  Code Status, Allergies Code Status Info: DNR Allergies Info: OTHER, BACITRACIN-POLYMYXIN B, PENICILLINS           Current Medications (03/31/2018):  This is the current hospital active medication list Current Facility-Administered Medications  Medication Dose Route Frequency Provider Last Rate Last Dose  . 0.9 %  sodium chloride infusion   Intravenous PRN Oswald Hillock, MD 50 mL/hr at 03/31/18 1600    . acetaminophen (TYLENOL) tablet 650 mg  650 mg  Oral Q6H PRN Elgergawy, Silver Huguenin, MD       Or  . acetaminophen (TYLENOL) suppository 650 mg  650 mg Rectal Q6H PRN Elgergawy, Dawood S, MD      . albuterol (PROVENTIL) (2.5 MG/3ML) 0.083% nebulizer solution 2.5 mg  2.5 mg Nebulization Q2H PRN Elgergawy, Silver Huguenin, MD      . collagenase (SANTYL) ointment   Topical Daily Elgergawy, Silver Huguenin, MD      . cycloSPORINE (RESTASIS) 0.05 % ophthalmic emulsion 1 drop  1 drop Both Eyes BID Elgergawy, Silver Huguenin, MD    1 drop at 03/31/18 1011  . dexamethasone (DECADRON) injection 2 mg  2 mg Intravenous Q12H Oswald Hillock, MD   2 mg at 03/31/18 1010  . docusate sodium (COLACE) capsule 100 mg  100 mg Oral BID Oswald Hillock, MD   100 mg at 03/31/18 1009  . feeding supplement (ENSURE ENLIVE) (ENSURE ENLIVE) liquid 237 mL  237 mL Oral BID BM Elgergawy, Silver Huguenin, MD   237 mL at 03/31/18 1541  . heparin injection 5,000 Units  5,000 Units Subcutaneous Q8H Elgergawy, Silver Huguenin, MD   5,000 Units at 03/31/18 1445  . magnesium citrate solution 1 Bottle  1 Bottle Oral Once PRN Elgergawy, Silver Huguenin, MD      . multivitamin with minerals tablet 1 tablet  1 tablet Oral Daily Elgergawy, Silver Huguenin, MD   1 tablet at 03/31/18 1011  . oxyCODONE (Oxy IR/ROXICODONE) immediate release tablet 5 mg  5 mg Oral TID PRN Elgergawy, Silver Huguenin, MD   5 mg at 03/31/18 1009  . senna (SENOKOT) tablet 8.6 mg  1 tablet Oral BID Elgergawy, Silver Huguenin, MD   8.6 mg at 03/31/18 1009     Discharge Medications: Please see discharge summary for a list of discharge medications.  Relevant Imaging Results:  Relevant Lab Results:   Additional Information SS# [id number]  Anell Barr

## 2018-03-31 NOTE — Clinical Social Work Note (Signed)
Clinical Social Work Assessment  Patient Details  Name: Mikayla Marks MRN: [id number] Date of Birth: 1928/04/23  Date of referral:  03/31/18               Reason for consult:  Facility Placement                Permission sought to share information with:  Family Supports, Customer service manager Permission granted to share information::  Yes, Verbal Permission Granted  Name::     Baldo Ash Daughter [phone]  [phone]   Agency::  SNF admissions  Relationship::     Contact Information:     Housing/Transportation Living arrangements for the past 2 months:  Single Family Home Source of Information:  Patient, Adult Children Patient Interpreter Needed:  None Criminal Activity/Legal Involvement Pertinent to Current Situation/Hospitalization:  No - Comment as needed Significant Relationships:  Adult Children Lives with:  Adult Children Do you feel safe going back to the place where you live?  No Need for family participation in patient care:  Yes (Comment)  Care giving concerns: Patient and daughter feel she needs some short term rehab before she is able to return back home.  Social Worker assessment / plan:  Patient is an 84 year old female who is alert and oriented x4 and lives with her daughter.  Patient states she has been to rehab before, however she was unsure how long ago it was.  Patient asked CSW to speak with her daughter [name] to discuss SNF placement options.  CSW contacted patient's daughter and she said patient was in rehab a few years ago, she was not very happy with the care patient received at [facility] which was the SNF she was in the past.  Patient's daughter stated that the patient's home health agency that she had was not very good either.  CSW informed daughter that there are several other home health agencies and SNFs.  Daughter is requesting the list of different SNFs locally.  CSW informed her the list can be given to her.  CSW explained how  insurance will pay for stay and what to expect at SNF.  Patient's daughter requested that CSW begin bed search in [place].  She did not express any other questions or concerns.  Employment status:  Retired Forensic scientist:  Medicare PT Recommendations:  [place] / Referral to community resources:  [place]  Patient/Family's Response to care:  Patient and family agreeable to going to SNF for short term rehab.  Patient/Family's Understanding of and Emotional Response to Diagnosis, Current Treatment, and Prognosis:  Patient's daughter is hopeful that patient will be able to get in a good SNF and improve enough to return back home.  Emotional Assessment Appearance:  Appears stated age Attitude/Demeanor/Rapport:    Affect (typically observed):  Appropriate, Calm Orientation:  Oriented to Self, Oriented to Place, Oriented to  Time, Oriented to Situation Alcohol / Substance use:  Not Applicable Psych involvement (Current and /or in the community):  No (Comment)  Discharge Needs  Concerns to be addressed:  Lack of Support, Care Coordination Readmission within the last 30 days:  No Current discharge risk:  Lack of support system Barriers to Discharge:  Continued Medical Work up, Programmer, applications ([place])   Ross Ludwig, LCSWA 03/31/2018, 7:01 PM

## 2018-03-31 NOTE — Progress Notes (Addendum)
[facility]  PROGRESS NOTE  Mikayla Marks KZS:[id number] DOB: 1928/03/13 DOA: 03/28/2018 PCP: Colon Branch, MD   Brief HPI:   84 year old female with a history of paranoid schizophrenia, hypertension, severe kyphosis, plasmacytoma was brought to the ED by daughter for poor appetite, reduced oral intake and progressive functional decline over the past few months.  In the ED CT of the cervical and thoracic spine showed multiple destructive expansile lytic lesion throughout the thoracic spine and new pathology and compression fracture of T11 and 12 with mild loss of height.    Subjective   Patient seen and examined, denies pain.  Daughter at bedside.   Assessment/Plan:     1. 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  At this time comfort measures have been recommended, pain control and palliative care has been consulted.  2. Pathological vertebral fracture-seen on CT thoracic spine she has pathological fracture involving T11 and T12 with mild loss of height.  Continue pain control.  3. UTI-patient started on empiric Rocephin, urine culture from 10/2-19 showed insignificant growth.  Will discontinue Rocephin at this time.  4. Chronic kidney disease stage III-creatinine at baseline.  5. Constipation-continue Senokot tablets, d Colace 100 mg p.o. twice daily      CBC: Recent Labs  Lab 03/28/18 2025 03/29/18 1045 03/30/18 0353  WBC 11.7* 9.3 8.1  NEUTROABS 10.3*  --   --   HGB 12.5 11.6* 10.7*  HCT 37.0 34.8* 32.8*  MCV 95.6 97.8 98.5  PLT 259 276 732    Basic Metabolic Panel: Recent Labs  Lab 03/28/18 2025 03/29/18 1045 03/30/18 0353  NA 136  --  141  K 4.7  --  4.3  CL 97*  --  105  CO2 27  --  27  GLUCOSE 115*  --  92  BUN 30*  --  28*  CREATININE 0.86 0.70 0.71  CALCIUM 11.4*  --  10.5*     DVT prophylaxis:  Heparin  Code Status:  DNR  Family Communication: Discussed with patient's daughter at bedside, she was concerned that she should be the primary contact for discussion regarding disposition.  I called and discussed with palliative care consultant Dr. Rowe Pavy, she has scheduled family meeting today with patient, her son and daughter.  Disposition Plan: pending palliative care discussion   Consultants:  Oncology  Palliative care  Procedures:  None   Antibiotics:   Anti-infectives (From admission, onward)   Start     Dose/Rate Route Frequency Ordered Stop   03/29/18 1000  cefTRIAXone (ROCEPHIN) 1 g in sodium chloride 0.9 % 100 mL IVPB  Status:  Discontinued     1 g 200 mL/hr over 30 Minutes Intravenous Every 24 hours 03/29/18 0820 03/30/18 1614       Objective   Vitals:   03/30/18 1635 03/30/18 2016 03/31/18 0516 03/31/18 1302  BP: (!) 146/82 117/61 140/80 (!) 160/85  Pulse: (!) 111 93 89 100  Resp: _0 Temp: 98 F (36.7 C) 98.5 F (36.9 C) 98.5 F (36.9 C) 98.1 F (36.7 C)  TempSrc: Oral Oral Oral Oral  SpO2: 94% 97% 95% 94%  Weight:      Height:       No intake or output data in the 24 hours ending 03/31/18 1321 Filed Weights   03/29/18 1104  Weight: 53.4 kg  Physical Examination:   Mouth: Oral mucosa is moist, no lesions on palate,  Neck: Supple, no deformities, masses, or tenderness Lungs: Normal respiratory effort, bilateral clear to auscultation, no crackles or wheezes.  Heart: Regular rate and rhythm, S1 and S2 normal, no murmurs, rubs auscultated Abdomen: BS normoactive,soft,nondistended,non-tender to palpation,no organomegaly Extremities: No pretibial edema, no erythema, no cyanosis, no clubbing Neuro : Alert and oriented to time, place and person, No focal deficits    Data Reviewed: I have personally reviewed following labs and imaging studies   Recent Results (from the past 240 hour(s))  Urine culture     Status: Abnormal    Collection Time: 03/28/18  8:42 PM  Result Value Ref Range Status   Specimen Description   Final    URINE, CLEAN CATCH Performed at [facility], [address] [place], [place] [address]    Special Requests   Final    NONE Performed at [facility], [address] [place], [place] [address]    Culture (A)  Final    <10,000 COLONIES/mL INSIGNIFICANT GROWTH Performed at [facility], [place] [address] [place], [place] [address]    Report Status 03/30/2018 FINAL  Final     Liver Function Tests: Recent Labs  Lab 03/28/18 2025  AST 19  ALT 13  ALKPHOS 62  BILITOT 1.2  PROT 7.5  ALBUMIN 3.5   No results for input(s): LIPASE, AMYLASE in the last 168 hours. No results for input(s): AMMONIA in the last 168 hours.  Cardiac Enzymes: No results for input(s): CKTOTAL, CKMB, CKMBINDEX, TROPONINI in the last 168 hours. BNP (last 3 results) No results for input(s): BNP in the last 8760 hours.  ProBNP (last 3 results) No results for input(s): PROBNP in the last 8760 hours.    Studies: No results found.  Scheduled Meds: . collagenase   Topical Daily  . cycloSPORINE  1 drop Both Eyes BID  . dexamethasone  2 mg Intravenous Q12H  . docusate sodium  100 mg Oral BID  . feeding supplement (ENSURE ENLIVE)  237 mL Oral BID BM  . heparin  5,000 Units Subcutaneous Q8H  . multivitamin with minerals  1 tablet Oral Daily  . senna  1 tablet Oral BID      Time spent: 25 min  [facility] Pager [phone]. If 7PM-7AM, please contact night-coverage at [email or web address], Office  [phone]  password [email or web address]  03/31/2018, 1:21 PM  LOS: 2 days

## 2018-03-31 NOTE — Clinical Social Work Note (Addendum)
CSW spoke with patient's daughter [name], and she would like CSW to begin bed search in [facility] for SNF placement.  Patient is a Manual [place] screen due to history of schizophrenia.  Jones Broom. [place], MSW, [place]  03/31/2018 6:55 PM

## 2018-03-31 NOTE — Progress Notes (Signed)
Daily Progress Note   Patient Name: Mikayla Marks       Date: 03/31/2018 DOB: 09-20-1927  Age: 84 y.o. MRN#: [id number] Attending Physician: Oswald Hillock, MD Primary Care Physician: Colon Branch, MD Admit Date: 03/28/2018  Reason for Consultation/Follow-up: Establishing goals of care  Subjective:  Patient is awake, alert.  She appears weak.   Patient's son, daughter in law, daughter Mikayla Marks and her grand daughter.  A family meeting took place, see below.   Length of Stay: 2  Current Medications: Scheduled Meds:  . collagenase   Topical Daily  . cycloSPORINE  1 drop Both Eyes BID  . dexamethasone  2 mg Intravenous Q12H  . docusate sodium  100 mg Oral BID  . feeding supplement (ENSURE ENLIVE)  237 mL Oral BID BM  . heparin  5,000 Units Subcutaneous Q8H  . multivitamin with minerals  1 tablet Oral Daily  . senna  1 tablet Oral BID    Continuous Infusions: . sodium chloride 50 mL/hr at 03/31/18 1012    PRN Meds: sodium chloride, acetaminophen **OR** acetaminophen, albuterol, magnesium citrate, oxyCODONE  Physical Exam                     Sclerae unicteric             Oropharynx clear             No peripheral adenopathy             Lungs clear -- no rales or rhonchi             Heart regular rate and rhythm             Abdomen benign             MSK: mild mid thoracic spine and low back tenderness, no peripheral edema             Neuro nonfocal Vital Signs: BP (!) 160/85 (BP Location: Left Arm)   Pulse 100   Temp 98.1 F (36.7 C) (Oral)   Resp 16   Ht _0  (1.676 m)   Wt 53.4 kg   SpO2 94%   BMI 19.00 kg/m  SpO2: SpO2: 94 % O2 Device: O2 Device: Room Air O2 Flow Rate:    Intake/output summary: No  intake or output data in the 24 hours ending 03/31/18 1326 LBM:  Last BM Date: 03/28/18 Baseline Weight: Weight: 53.4 kg Most recent weight: Weight: 53.4 kg       Palliative Assessment/Data:      Patient Active Problem List   Diagnosis Date Noted  . Encounter for palliative care   . Goals of care, counseling/discussion   . Generalized weakness 03/29/2018  . Acute cystitis with hematuria 03/29/2018  . Malnutrition of moderate degree 03/29/2018  . Failure to thrive (0-17)   . Multiple myeloma not having achieved remission ([place])   . Failure to thrive in adult 03/28/2018  . Dry eyes, bilateral 04/17/2016  . Chronic Marks disease   . Carotid artery stenosis   . Spinal cord compression ([place])   . Prolonged QT interval 04/03/2016  . Palliative care encounter   . Intractable back pain 04/02/2016  . 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 Pathologic vertebral fracture 11/12/2015  . Dyspnea 11/11/2015  . Pleural effusion, bilateral 11/11/2015  . PCP NOTES >>>>>>>>>>>>>>>>>>>>>>>>>>>>>>>> 10/26/2015  . Hammer toe of left foot 01/30/2015  . Bilateral leg edema 09/10/2014  . Onychomycosis 05/19/2014  . Pain in lower limb 05/19/2014  . Fatigue 02/11/2014  . Medicare annual wellness visit, subsequent 09/02/2013  . Pain in joint, foot, left 07/10/2013  . Corn of toe 02/04/2013  . Varicose veins of lower extremities with inflammation 11/20/2012  . DEGENERATIVE JOINT DISEASE 01/12/2010  . Schizophrenia ([place]) 06/30/2009  . SKIN CANCER, LEG 05/29/2009  . Osteoporosis 08/26/2008  . HYPERLIPIDEMIA 10/16/2006  . 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 The chart was opened on the computer in the room and discussions were held. CT scans were reviewed. Imaging as well as blood work reviewed extensively.  Goals wishes and values discussed in detail. Appropriate disposition options discussed in detail. Reviewed information that has been provided by our medical oncology colleagues, hospital medicine, physical therapy.  The patient is very tired but she is awake and alert, oriented and able to answer all questions appropriately as well as be able to share her thoughts and feelings. She states that she is very happy to see all of her family now at the bedside. Towards the end of this hospitalization, patient very clearly states that she does not want to go back home. She states she would like to go to a skilled nursing facility, wants to work on physical therapy, safe techniques. She does not want to be a burden on her daughter. She states she went to [facility] during a previous hospitalization. She is willing to talk with social work regarding skilled nursing facility options. I have recommended a rehabilitation attempt with palliative services following.  Hospice philosophy of care was also introduced and discussed in detail. Towards the end of the rehabilitation trial, discussed about next steps, further options would include addition of hospice as an extra layer of support. Patient could either go to a hospice facility if she meets criteria at that time after rehabilitation or she could come home with hospice support.  Patient's daughter Mikayla Marks reiterated several times during the family meeting  that she was the patient's sole caretaker, primary caregiver, has been with the patient at home 24/7 and knows the patient's care very well. Mikayla Marks expresses concern about how the patient will be treated at a skilled nursing facility.  Separately, I met with the patient's son outside the room where the  son shared extensively about the patient, the patient's daughter Mikayla Marks, their living situation. Provided active listening and supportive care.   All of the patient and family's questions answered to the best of my ability. Social work consult has been requested to help facilitate skilled nursing facility for rehabilitation attempt with palliative services following.   Code Status:    Code Status Orders  (From admission, onward)         Start     Ordered   03/29/18 0824  Do not attempt resuscitation (DNR)  Continuous    Question Answer Comment  In the event of cardiac or respiratory ARREST Do not call a "code blue"   In the event of cardiac or respiratory ARREST Do not perform Intubation, CPR, defibrillation or ACLS   In the event of cardiac or respiratory ARREST Use medication by any route, position, wound care, and other measures to relive pain and suffering. May use oxygen, suction and manual treatment of airway obstruction as needed for comfort.      03/29/18 0823        Code Status History    Date Active Date Inactive Code Status Order ID Comments User Context   04/03/2016 1515 04/12/2016 1943 DNR [id number]  Dory Horn, NP Inpatient   04/03/2016 0231 04/03/2016 1515 Partial Code [id number]  Edwin Dada, MD Inpatient   04/03/2016 0021 04/03/2016 0231 Full Code [id number]  Edwin Dada, MD Inpatient   04/02/2016 2329 04/03/2016 0021 DNR [id number]  Edwin Dada, MD Inpatient       Prognosis:   likely as short as 2.3 weeks to as long as 2-3 months, at this point, in my opinion.   Discharge Planning:  [place] for rehab with Palliative care service follow-up  Care plan was discussed with Patient, son, daughter, daughter in law, grand daughter.   Thank you for allowing the Palliative Medicine Team to assist in the care of this patient.   Time In:  12 Time Out: 1330 Total Time 90 min  Prolonged Time Billed  yes         Greater than 50%  of this time was spent counseling and coordinating care related to the above assessment and plan.  Loistine Chance, MD [phone]  Please contact Palliative Medicine Team phone at [phone] for questions and concerns.

## 2018-03-31 NOTE — [place] FL2 (Signed)
MEDICAID FL2 LEVEL OF CARE SCREENING TOOL     IDENTIFICATION  Patient Name: Mikayla Marks Birthdate: 30-Jun-1927 Sex: female Admission Date (Current Location): 03/28/2018  [facility] and [place] Number:  Engineering geologist and Address:  [facility],  [address] [place], [place]      Provider Number: [id number]  Attending Physician Name and Address:  Oswald Hillock, MD  Relative Name and Phone Number:  Baldo Ash Daughter [phone]  [phone]     Current Level of Care: Hospital Recommended Level of Care: [place] Prior Approval Number:    Date Approved/Denied:   PASRR Number: Pending  Discharge Plan: SNF    Current Diagnoses: Patient Active Problem List   Diagnosis Date Noted  . Encounter for palliative care   . Goals of care, counseling/discussion   . Generalized weakness 03/29/2018  . Acute cystitis with hematuria 03/29/2018  . Malnutrition of moderate degree 03/29/2018  . Failure to thrive (0-17)   . Multiple myeloma not having achieved remission ([place])   . Failure to thrive in adult 03/28/2018  . Dry eyes, bilateral 04/17/2016  . Chronic kidney disease   . Carotid artery stenosis   . Spinal cord compression ([place])   . Prolonged QT interval 04/03/2016  . Palliative care encounter   . Intractable back pain 04/02/2016  . 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 Pathologic vertebral fracture 11/12/2015  . Dyspnea 11/11/2015  . Pleural effusion, bilateral 11/11/2015  . PCP NOTES >>>>>>>>>>>>>>>>>>>>>>>>>>>>>>>> 10/26/2015  . Hammer toe of left foot 01/30/2015  . Bilateral leg edema 09/10/2014  . Onychomycosis 05/19/2014  . Pain in lower limb 05/19/2014  . Fatigue 02/11/2014  . Medicare annual wellness visit, subsequent 09/02/2013  . Pain in joint, foot, left 07/10/2013  . Corn of toe 02/04/2013  . Varicose veins of lower extremities with inflammation 11/20/2012  . DEGENERATIVE JOINT DISEASE 01/12/2010  .  Schizophrenia ([place]) 06/30/2009  . SKIN CANCER, LEG 05/29/2009  . Osteoporosis 08/26/2008  . HYPERLIPIDEMIA 10/16/2006  . Essential hypertension 10/16/2006    Orientation RESPIRATION BLADDER Height & Weight     Self, Time, Situation, Place  Normal Continent Weight: 117 lb 11.6 oz (53.4 kg) Height:  5' 6"  (167.6 cm)  BEHAVIORAL SYMPTOMS/MOOD NEUROLOGICAL BOWEL NUTRITION STATUS      Continent Diet  AMBULATORY STATUS COMMUNICATION OF NEEDS Skin   Limited Assist Verbally Surgical wounds                       Personal Care Assistance Level of Assistance  Bathing, Feeding, Dressing Bathing Assistance: Limited assistance Feeding assistance: Independent Dressing Assistance: Limited assistance     Functional Limitations Info  Sight, Hearing, Speech Sight Info: Adequate Hearing Info: Adequate Speech Info: Adequate    SPECIAL CARE FACTORS FREQUENCY  PT (By licensed PT), OT (By licensed OT)     PT Frequency: 5x a week OT Frequency: 5x a week            Contractures Contractures Info: Not present    Additional Factors Info  Code Status, Allergies Code Status Info: DNR Allergies Info: OTHER, BACITRACIN-POLYMYXIN B, PENICILLINS            Current Medications (03/31/2018):  This is the current hospital active medication list Current Facility-Administered Medications  Medication Dose Route Frequency Provider Last Rate Last Dose  . 0.9 %  sodium chloride infusion   Intravenous PRN Oswald Hillock, MD 50 mL/hr at 03/31/18 1600    .  acetaminophen (TYLENOL) tablet 650 mg  650 mg Oral Q6H PRN Elgergawy, Silver Huguenin, MD       Or  . acetaminophen (TYLENOL) suppository 650 mg  650 mg Rectal Q6H PRN Elgergawy, Dawood S, MD      . albuterol (PROVENTIL) (2.5 MG/3ML) 0.083% nebulizer solution 2.5 mg  2.5 mg Nebulization Q2H PRN Elgergawy, Silver Huguenin, MD      . collagenase (SANTYL) ointment   Topical Daily Elgergawy, Silver Huguenin, MD      . cycloSPORINE (RESTASIS) 0.05 % ophthalmic emulsion 1  drop  1 drop Both Eyes BID Elgergawy, Silver Huguenin, MD   1 drop at 03/31/18 1011  . dexamethasone (DECADRON) injection 2 mg  2 mg Intravenous Q12H Oswald Hillock, MD   2 mg at 03/31/18 1010  . docusate sodium (COLACE) capsule 100 mg  100 mg Oral BID Oswald Hillock, MD   100 mg at 03/31/18 1009  . feeding supplement (ENSURE ENLIVE) (ENSURE ENLIVE) liquid 237 mL  237 mL Oral BID BM Elgergawy, Silver Huguenin, MD   237 mL at 03/31/18 1541  . heparin injection 5,000 Units  5,000 Units Subcutaneous Q8H Elgergawy, Silver Huguenin, MD   5,000 Units at 03/31/18 1445  . magnesium citrate solution 1 Bottle  1 Bottle Oral Once PRN Elgergawy, Silver Huguenin, MD      . multivitamin with minerals tablet 1 tablet  1 tablet Oral Daily Elgergawy, Silver Huguenin, MD   1 tablet at 03/31/18 1011  . oxyCODONE (Oxy IR/ROXICODONE) immediate release tablet 5 mg  5 mg Oral TID PRN Elgergawy, Silver Huguenin, MD   5 mg at 03/31/18 1009  . senna (SENOKOT) tablet 8.6 mg  1 tablet Oral BID Elgergawy, Silver Huguenin, MD   8.6 mg at 03/31/18 1009     Discharge Medications: Please see discharge summary for a list of discharge medications.  Relevant Imaging Results:  Relevant Lab Results:   Additional Information SS# [id number]  Anell Barr

## 2018-03-31 NOTE — Progress Notes (Signed)
PMT no charge note  Call received from [facility] MD and also extensively discussed with daughter [name] over the phone, that she is the patient's primary caregiver and any and all decisions need to go through her.   A family meeting has been set up for NOON today, to discuss with daughter and patient about goals of care and safe disposition options.   Additionally, call placed and discussed with son Dr Deedra Ehrich. He is on his way to the hospital, and may be able to join [place] for the [facility] meeting today, or even later today, for a separate family meeting. If not, then a tentative family meeting has also been set up with son and patient for 04-01-18 at 0900.   Full note and final recommendations to follow.   Loistine Chance MD [facility] health Palliative Medicine team [phone]

## 2018-04-01 NOTE — Progress Notes (Signed)
[facility]  PROGRESS NOTE  Mikayla Marks ZOX:[id number] DOB: 08/24/1927 DOA: 03/28/2018 PCP: Colon Branch, MD   Brief HPI:   84 year old female with a history of paranoid schizophrenia, hypertension, severe kyphosis, plasmacytoma was brought to the ED by daughter for poor appetite, reduced oral intake and progressive functional decline over the past few months.  In the ED CT of the cervical and thoracic spine showed multiple destructive expansile lytic lesion throughout the thoracic spine and new pathology and compression fracture of T11 and 12 with mild loss of height.    Subjective   Patient seen and examined, denies any pain.  Feels better this morning.  Palliative care meeting took place yesterday and plan is to go to skilled nursing facility.   Assessment/Plan:     1. 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  At this time comfort measures have been recommended, pain control and palliative care has been consulted.  Decadron 2 mg IV every 12 hours ordered as adjuvant pain therapy  2. Pathological vertebral fracture-seen on CT thoracic spine she has pathological fracture involving T11 and T12 with mild loss of height.  Continue pain control.  3. UTI-patient started on empiric Rocephin, urine culture from 10/2-19 showed insignificant growth.  Rocephin was discontinued  4. Chronic kidney disease stage III-creatinine at baseline.  5. Constipation-continue Senokot tablets, Colace 100 mg p.o. twice daily      CBC: Recent Labs  Lab 03/28/18 2025 03/29/18 1045 03/30/18 0353  WBC 11.7* 9.3 8.1  NEUTROABS 10.3*  --   --   HGB 12.5 11.6* 10.7*  HCT 37.0 34.8* 32.8*  MCV 95.6 97.8 98.5  PLT 259 276 811    Basic Metabolic Panel: Recent Labs  Lab 03/28/18 2025 03/29/18 1045 03/30/18 0353  NA 136  --  141  K 4.7  --  4.3  CL 97*   --  105  CO2 27  --  27  GLUCOSE 115*  --  92  BUN 30*  --  28*  CREATININE 0.86 0.70 0.71  CALCIUM 11.4*  --  10.5*     DVT prophylaxis: Heparin  Code Status:  DNR  Family Communication: Discussed with patient's daughter at bedside, she was concerned that she should be the primary contact for discussion regarding disposition.  I called and discussed with palliative care consultant Dr. Rowe Pavy, she has scheduled family meeting today with patient, her son and daughter.  Disposition Plan: pending palliative care discussion   Consultants:  Oncology  Palliative care  Procedures:  None   Antibiotics:   Anti-infectives (From admission, onward)   Start     Dose/Rate Route Frequency Ordered Stop   03/29/18 1000  cefTRIAXone (ROCEPHIN) 1 g in sodium chloride 0.9 % 100 mL IVPB  Status:  Discontinued     1 g 200 mL/hr over 30 Minutes Intravenous Every 24 hours 03/29/18 0820 03/30/18 1614       Objective   Vitals:   03/31/18 1302 03/31/18 2037 04/01/18 0526 04/01/18 1145  BP: (!) 160/85 129/78 (!) 165/88   Pulse: 100 100 97   Resp: 16 16 17    Temp: 98.1 F (36.7 C) 97.7 F (36.5 C) 97.6 F (36.4 C)   TempSrc: Oral Oral Oral   SpO2: 94% 98% 96% 95%  Weight:      Height:        Intake/Output Summary (  Last 24 hours) at 04/01/2018 1254 Last data filed at 03/31/2018 1600 Gross per 24 hour  Intake 285.96 ml  Output 200 ml  Net 85.96 ml   Filed Weights   03/29/18 1104  Weight: 53.4 kg     Physical Examination:   Neck: Supple, no deformities, masses, or tenderness Lungs: Normal respiratory effort, bilateral clear to auscultation, no crackles or wheezes.  Heart: Regular rate and rhythm, S1 and S2 normal, no murmurs, rubs auscultated Abdomen: BS normoactive,soft,nondistended,non-tender to palpation,no organomegaly Extremities: No pretibial edema, no erythema, no cyanosis, no clubbing Neuro : Alert and oriented to time, place and person, No focal  deficits    Data Reviewed: I have personally reviewed following labs and imaging studies   Recent Results (from the past 240 hour(s))  Urine culture     Status: Abnormal   Collection Time: 03/28/18  8:42 PM  Result Value Ref Range Status   Specimen Description   Final    URINE, CLEAN CATCH Performed at [facility], [address] [place], [place] [address]    Special Requests   Final    NONE Performed at [facility], [address] [place], [place] [address]    Culture (A)  Final    <10,000 COLONIES/mL INSIGNIFICANT GROWTH Performed at [facility], [place] [address], [place], [place] [address]    Report Status 03/30/2018 FINAL  Final     Liver Function Tests: Recent Labs  Lab 03/28/18 2025  AST 19  ALT 13  ALKPHOS 62  BILITOT 1.2  PROT 7.5  ALBUMIN 3.5   Is present-year-old obese may be a 90   Studies: No results found.  Scheduled Meds: . collagenase   Topical Daily  . cycloSPORINE  1 drop Both Eyes BID  . dexamethasone  2 mg Intravenous Q12H  . docusate sodium  100 mg Oral BID  . feeding supplement (ENSURE ENLIVE)  237 mL Oral BID BM  . heparin  5,000 Units Subcutaneous Q8H  . multivitamin with minerals  1 tablet Oral Daily  . senna  1 tablet Oral BID      Time spent: 25 min  [place] [facility] Pager [phone]. If 7PM-7AM, please contact night-coverage at [email or web address], Office  [phone]  password [email or web address]  04/01/2018, 12:54 PM  LOS: 3 days

## 2018-04-02 LAB — MULTIPLE MYELOMA PANEL, SERUM
ALBUMIN/GLOB SERPL: 1 (ref 0.7–1.7)
ALPHA 1: 0.3 g/dL (ref 0.0–0.4)
ALPHA2 GLOB SERPL ELPH-MCNC: 0.7 g/dL (ref 0.4–1.0)
Albumin SerPl Elph-Mcnc: 2.7 g/dL — ABNORMAL LOW (ref 2.9–4.4)
B-GLOBULIN SERPL ELPH-MCNC: 1.6 g/dL — AB (ref 0.7–1.3)
Gamma Glob SerPl Elph-Mcnc: 0.3 g/dL — ABNORMAL LOW (ref 0.4–1.8)
Globulin, Total: 2.9 g/dL (ref 2.2–3.9)
IGG (IMMUNOGLOBIN G), SERUM: 355 mg/dL — AB (ref 700–1600)
IGM (IMMUNOGLOBULIN M), SRM: 57 mg/dL (ref 26–217)
IgA: 947 mg/dL — ABNORMAL HIGH (ref 64–422)
M PROTEIN SERPL ELPH-MCNC: 1 g/dL — AB
TOTAL PROTEIN ELP: 5.6 g/dL — AB (ref 6.0–8.5)

## 2018-04-02 LAB — KAPPA/LAMBDA LIGHT CHAINS
KAPPA FREE LGHT CHN: 389 mg/L — AB (ref 3.3–19.4)
Kappa, lambda light chain ratio: 117.88 — ABNORMAL HIGH (ref 0.26–1.65)
Lambda free light chains: 3.3 mg/L — ABNORMAL LOW (ref 5.7–26.3)

## 2018-04-02 MED ORDER — SENNA 8.6 MG PO TABS
2.0000 | ORAL_TABLET | Freq: Two times a day (BID) | ORAL | Status: DC
  Administered 2018-04-02 – 2018-04-03 (×3): 17.2 mg via ORAL
  Filled 2018-04-02 (×3): qty 2

## 2018-04-02 MED ORDER — CYCLOBENZAPRINE HCL 5 MG PO TABS: 5.0000 mg | ORAL_TABLET | Freq: Three times a day (TID) | ORAL | Status: DC | PRN

## 2018-04-02 NOTE — Progress Notes (Signed)
CSW spoke with patient and daughter, [name], at bedside regarding SNF placement. CSW provided list of facilities that have accepted patient. Daughter wants to review list and speak with facilities before making decision. She does not feel patient is ready to discharge today.   Patient's PASSR is still pending due to history of schizophrenia. CSW will fax requested documents.    Pricilla Holm, MSW, [facility] Clinical Social Work [phone] (Weekend)

## 2018-04-02 NOTE — Care Management Important Message (Signed)
Important Message  Patient Details  Name: Mikayla Marks MRN: [id number] Date of Birth: [birth year]   Medicare Important Message Given:  Yes    Kerin Salen 04/02/2018, 12:29 [place] Message  Patient Details  Name: Mikayla Marks MRN: [id number] Date of Birth: [birth year]   Medicare Important Message Given:  Yes    Kerin Salen 04/02/2018, 12:29 PM

## 2018-04-02 NOTE — Progress Notes (Signed)
[facility]  PROGRESS NOTE  Mikayla Marks CNO:[id number] DOB: 03-Sep-1927 DOA: 03/28/2018 PCP: Colon Branch, MD   Brief HPI:   84 year old female with a history of paranoid schizophrenia, hypertension, severe kyphosis, plasmacytoma was brought to the ED by daughter for poor appetite, reduced oral intake and progressive functional decline over the past few months.  In the ED CT of the cervical and thoracic spine showed multiple destructive expansile lytic lesion throughout the thoracic spine and new pathology and compression fracture of T11 and 12 with mild loss of height.    Subjective   Patient seen and examined, complains of pain in the neck due to muscle spasm.   Assessment/Plan:     1. 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  At this time comfort measures have been recommended, pain control and palliative care has been consulted.  Decadron 2 mg IV every 12 hours ordered as adjuvant pain therapy  2. Pathological vertebral fracture-seen on CT thoracic spine she has pathological fracture involving T11 and T12 with mild loss of height.  Continue pain control.  3. UTI-patient started on empiric Rocephin, urine culture from 10/2-19 showed insignificant growth.  Rocephin was discontinued  4. Chronic kidney disease stage III-creatinine at baseline.  5. Constipation-continue Senokot tablets, Colace 100 mg p.o. twice daily increased Senokot tablets to 2 tablets p.o. twice daily for 7 days  6. Neck pain-patient has neck pain due to muscle spasm, will start Flexeril 5 mg p.o. 3 times daily as needed      CBC: Recent Labs  Lab 03/28/18 2025 03/29/18 1045 03/30/18 0353  WBC 11.7* 9.3 8.1  NEUTROABS 10.3*  --   --   HGB 12.5 11.6* 10.7*  HCT 37.0 34.8* 32.8*  MCV 95.6 97.8 98.5  PLT 259 276 294    Basic Metabolic Panel: Recent Labs   Lab 03/28/18 2025 03/29/18 1045 03/30/18 0353  NA 136  --  141  K 4.7  --  4.3  CL 97*  --  105  CO2 27  --  27  GLUCOSE 115*  --  92  BUN 30*  --  28*  CREATININE 0.86 0.70 0.71  CALCIUM 11.4*  --  10.5*     DVT prophylaxis: Heparin  Code Status:  DNR  Family Communication: Discussed with patient's daughter at bedside, plan to go to skilled nursing facility  Disposition Plan: pending palliative care discussion   Consultants:  Oncology  Palliative care  Procedures:  None   Antibiotics:   Anti-infectives (From admission, onward)   Start     Dose/Rate Route Frequency Ordered Stop   03/29/18 1000  cefTRIAXone (ROCEPHIN) 1 g in sodium chloride 0.9 % 100 mL IVPB  Status:  Discontinued     1 g 200 mL/hr over 30 Minutes Intravenous Every 24 hours 03/29/18 0820 03/30/18 1614       Objective   Vitals:   04/01/18 1338 04/01/18 2020 04/02/18 0618 04/02/18 1206  BP: (!) 165/91 (!) 153/82 (!) 146/80 (!) 151/79  Pulse: 100 99 90 95  Resp: _0 Temp: 97.9 F (36.6 C) 98.1 F (36.7 C) 98 F (36.7 C) 97.8 F (36.6 C)  TempSrc: Oral Oral Oral Oral  SpO2: 98% 96% 98% 96%  Weight:      Height:        Intake/Output Summary (Last 24 hours) at 04/02/2018  [place] filed at 04/02/2018 1300 Gross per 24 hour  Intake 2213.92 ml  Output 1850 ml  Net 363.92 ml   Filed Weights   03/29/18 1104  Weight: 53.4 kg     Physical Examination:   Mouth: Oral mucosa is moist, no lesions on palate,  Neck: Supple, no deformities, masses, or tenderness Lungs: Normal respiratory effort, bilateral clear to auscultation, no crackles or wheezes.  Heart: Regular rate and rhythm, S1 and S2 normal, no murmurs, rubs auscultated Abdomen: BS normoactive,soft,nondistended,non-tender to palpation,no organomegaly Extremities: No pretibial edema, no erythema, no cyanosis, no clubbing Neuro : Alert and oriented to time, place and person, No focal deficits    Data  Reviewed: I have personally reviewed following labs and imaging studies   Recent Results (from the past 240 hour(s))  Urine culture     Status: Abnormal   Collection Time: 03/28/18  8:42 PM  Result Value Ref Range Status   Specimen Description   Final    URINE, CLEAN CATCH Performed at [facility], [address] [place], [place] [address]    Special Requests   Final    NONE Performed at [facility], [address] [place], [place] [address]    Culture (A)  Final    <10,000 COLONIES/mL INSIGNIFICANT GROWTH Performed at [facility], [place] [address] [place], [place] [address]    Report Status 03/30/2018 FINAL  Final     Liver Function Tests: Recent Labs  Lab 03/28/18 2025  AST 19  ALT 13  ALKPHOS 62  BILITOT 1.2  PROT 7.5  ALBUMIN 3.5   Is present-year-old obese may be a 89   Studies: No results found.  Scheduled Meds: . collagenase   Topical Daily  . cycloSPORINE  1 drop Both Eyes BID  . dexamethasone  2 mg Intravenous Q12H  . docusate sodium  100 mg Oral BID  . feeding supplement (ENSURE ENLIVE)  237 mL Oral BID BM  . heparin  5,000 Units Subcutaneous Q8H  . multivitamin with minerals  1 tablet Oral Daily  . senna  2 tablet Oral BID      Time spent: 25 min  [place] [facility] Pager [phone]. If 7PM-7AM, please contact night-coverage at [email or web address], Office  [phone]  password [email or web address]  04/02/2018, 2:37 PM  LOS: 4 days

## 2018-04-02 NOTE — Progress Notes (Signed)
Physical Therapy Treatment Patient Details Name: Mikayla Marks MRN: [id number] DOB: [birth year] Today's Date: 04/02/2018    History of Present Illness 84 yo female admitted with FTT. Sustained a fall at home. Imaging (+) compression fxs T11,T12 and innumberable lytic lesions T-L spine. Hx of schizoaffective d/o, osteoporosis, CKD, chronic pain, severe kyphosis    PT Comments    Assisted patient to [facility]. Patient had successful BM. Assisted back into bed. Patient tolerates mobility well considering  Severe spine deformities and medical issues. Patient is very pleasant and participatory.    Follow Up Recommendations  SNF     Equipment Recommendations  Hospital bed(if DC home)    Recommendations for Other Services       Precautions / Restrictions Precautions Precautions: Fall    Mobility  Bed Mobility   Bed Mobility: Supine to Sit;Sit to Supine     Supine to sit: Mod assist;+2 for safety/equipment Sit to supine: Max assist;+2 for physical assistance   General bed mobility comments: Patient is eager to get up. Self assisted legs, required assist for trunk.  Transfers Overall transfer level: Needs assistance Equipment used: Rolling walker (2 wheeled) Transfers: Sit to/from Stand Sit to Stand: Mod assist;+2 physical assistance;+2 safety/equipment         General transfer comment: Assist to rise, stabilize, control descent. Multimodal cueing for safety, technique, hand placement.  stood for [facility] to be placed then for the bed to be pulled up.   Ambulation/Gait                 Stairs             Wheelchair Mobility    Modified Rankin (Stroke Patients Only)       Balance Overall balance assessment: Needs assistance Sitting-balance support: Bilateral upper extremity supported;Feet supported Sitting balance-Leahy Scale: Fair     Standing balance support: Bilateral upper extremity supported;During functional activity Standing balance-Leahy Scale:  Poor Standing balance comment: requires UE support                            Cognition Arousal/Alertness: Awake/alert                                            Exercises      General Comments        Pertinent Vitals/Pain Faces Pain Scale: Hurts even more Pain Location: back,  Pain Descriptors / Indicators: Grimacing;Discomfort Pain Intervention(s): Monitored during session;Repositioned    Home Living                      Prior Function            PT Goals (current goals can now be found in the care plan section) Progress towards PT goals: Progressing toward goals    Frequency    Min 2X/week      PT Plan Current plan remains appropriate    Co-evaluation              AM-PAC PT "6 Clicks" Daily Activity  Outcome Measure  Difficulty turning over in bed (including adjusting bedclothes, sheets and blankets)?: Unable Difficulty moving from lying on back to sitting on the side of the bed? : Unable Difficulty sitting down on and standing up from a chair with arms (e.g., wheelchair, bedside commode, etc,.)?: Unable  Help needed moving to and from a bed to chair (including a wheelchair)?: Total Help needed walking in hospital room?: Total Help needed climbing 3-5 steps with a railing? : Total 6 Click Score: 6    End of Session   Activity Tolerance: Patient tolerated treatment well Patient left: in bed;with call bell/phone within reach;with bed alarm set Nurse Communication: Mobility status PT Visit Diagnosis: Muscle weakness (generalized) (M62.81);Other abnormalities of gait and mobility (R26.89);Adult, failure to thrive (R62.7)     Time: 2174-7159 PT Time Calculation (min) (ACUTE ONLY): 27 min  Charges:  $Therapeutic Activity: 8-22 mins $Self Care/Home Management: [place] Pager [phone] Office [phone]    Claretha Cooper 04/02/2018,  2:42 PM

## 2018-04-03 DIAGNOSIS — R29898 Other symptoms and signs involving the musculoskeletal system: Secondary | ICD-10-CM | POA: Diagnosis not present

## 2018-04-03 DIAGNOSIS — Z515 Encounter for palliative care: Secondary | ICD-10-CM

## 2018-04-03 DIAGNOSIS — L259 Unspecified contact dermatitis, unspecified cause: Secondary | ICD-10-CM | POA: Diagnosis not present

## 2018-04-03 DIAGNOSIS — Z7401 Bed confinement status: Secondary | ICD-10-CM | POA: Diagnosis not present

## 2018-04-03 DIAGNOSIS — S22080D Wedge compression fracture of T11-T12 vertebra, subsequent encounter for fracture with routine healing: Secondary | ICD-10-CM | POA: Diagnosis not present

## 2018-04-03 DIAGNOSIS — M2550 Pain in unspecified joint: Secondary | ICD-10-CM | POA: Diagnosis not present

## 2018-04-03 DIAGNOSIS — F329 Major depressive disorder, single episode, unspecified: Secondary | ICD-10-CM | POA: Diagnosis not present

## 2018-04-03 DIAGNOSIS — M4854XA Collapsed vertebra, not elsewhere classified, thoracic region, initial encounter for fracture: Secondary | ICD-10-CM | POA: Diagnosis not present

## 2018-04-03 DIAGNOSIS — L89104 Pressure ulcer of unspecified part of back, stage 4: Secondary | ICD-10-CM | POA: Diagnosis not present

## 2018-04-03 DIAGNOSIS — E440 Moderate protein-calorie malnutrition: Secondary | ICD-10-CM | POA: Diagnosis not present

## 2018-04-03 DIAGNOSIS — L89113 Pressure ulcer of right upper back, stage 3: Secondary | ICD-10-CM | POA: Diagnosis not present

## 2018-04-03 DIAGNOSIS — M545 Low back pain: Secondary | ICD-10-CM | POA: Diagnosis not present

## 2018-04-03 DIAGNOSIS — N189 Chronic kidney disease, unspecified: Secondary | ICD-10-CM | POA: Diagnosis not present

## 2018-04-03 DIAGNOSIS — R2681 Unsteadiness on feet: Secondary | ICD-10-CM | POA: Diagnosis not present

## 2018-04-03 DIAGNOSIS — R1312 Dysphagia, oropharyngeal phase: Secondary | ICD-10-CM | POA: Diagnosis not present

## 2018-04-03 DIAGNOSIS — F419 Anxiety disorder, unspecified: Secondary | ICD-10-CM | POA: Diagnosis not present

## 2018-04-03 DIAGNOSIS — R498 Other voice and resonance disorders: Secondary | ICD-10-CM | POA: Diagnosis not present

## 2018-04-03 DIAGNOSIS — R5381 Other malaise: Secondary | ICD-10-CM | POA: Diagnosis not present

## 2018-04-03 DIAGNOSIS — A4901 Methicillin susceptible Staphylococcus aureus infection, unspecified site: Secondary | ICD-10-CM | POA: Diagnosis not present

## 2018-04-03 DIAGNOSIS — C9030 Solitary plasmacytoma not having achieved remission: Secondary | ICD-10-CM

## 2018-04-03 DIAGNOSIS — R000 Tachycardia, unspecified: Secondary | ICD-10-CM | POA: Diagnosis not present

## 2018-04-03 DIAGNOSIS — L89124 Pressure ulcer of left upper back, stage 4: Secondary | ICD-10-CM | POA: Diagnosis not present

## 2018-04-03 DIAGNOSIS — D649 Anemia, unspecified: Secondary | ICD-10-CM | POA: Diagnosis not present

## 2018-04-03 DIAGNOSIS — M8448XD Pathological fracture, other site, subsequent encounter for fracture with routine healing: Secondary | ICD-10-CM | POA: Diagnosis not present

## 2018-04-03 DIAGNOSIS — M6281 Muscle weakness (generalized): Secondary | ICD-10-CM | POA: Diagnosis not present

## 2018-04-03 DIAGNOSIS — K7290 Hepatic failure, unspecified without coma: Secondary | ICD-10-CM | POA: Diagnosis not present

## 2018-04-03 DIAGNOSIS — R2689 Other abnormalities of gait and mobility: Secondary | ICD-10-CM | POA: Diagnosis not present

## 2018-04-03 DIAGNOSIS — R531 Weakness: Secondary | ICD-10-CM | POA: Diagnosis not present

## 2018-04-03 DIAGNOSIS — L89109 Pressure ulcer of unspecified part of back, unspecified stage: Secondary | ICD-10-CM | POA: Diagnosis not present

## 2018-04-03 DIAGNOSIS — Z23 Encounter for immunization: Secondary | ICD-10-CM | POA: Diagnosis not present

## 2018-04-03 DIAGNOSIS — C9000 Multiple myeloma not having achieved remission: Secondary | ICD-10-CM | POA: Diagnosis not present

## 2018-04-03 DIAGNOSIS — F200 Paranoid schizophrenia: Secondary | ICD-10-CM | POA: Diagnosis not present

## 2018-04-03 DIAGNOSIS — M8448XS Pathological fracture, other site, sequela: Secondary | ICD-10-CM | POA: Diagnosis not present

## 2018-04-03 DIAGNOSIS — Z79899 Other long term (current) drug therapy: Secondary | ICD-10-CM | POA: Diagnosis not present

## 2018-04-03 DIAGNOSIS — N3001 Acute cystitis with hematuria: Secondary | ICD-10-CM | POA: Diagnosis not present

## 2018-04-03 MED ORDER — DEXAMETHASONE 2 MG PO TABS: 2.0000 mg | ORAL_TABLET | Freq: Two times a day (BID) | ORAL | Status: AC

## 2018-04-03 MED ORDER — DOCUSATE SODIUM 100 MG PO CAPS: 100.0000 mg | ORAL_CAPSULE | Freq: Two times a day (BID) | ORAL | 0 refills | Status: AC

## 2018-04-03 MED ORDER — CYCLOSPORINE 0.05 % OP EMUL: 1.0000 [drp] | Freq: Two times a day (BID) | OPHTHALMIC | 0 refills | Status: AC

## 2018-04-03 MED ORDER — ACETAMINOPHEN 325 MG PO TABS: 650.0000 mg | ORAL_TABLET | Freq: Four times a day (QID) | ORAL | Status: AC | PRN

## 2018-04-03 MED ORDER — OXYCODONE HCL 5 MG PO TABS: 5.0000 mg | ORAL_TABLET | Freq: Three times a day (TID) | ORAL | 0 refills | Status: AC | PRN

## 2018-04-03 MED ORDER — COLLAGENASE 250 UNIT/GM EX OINT: TOPICAL_OINTMENT | Freq: Every day | CUTANEOUS | 0 refills | Status: AC

## 2018-04-03 MED ORDER — ALBUTEROL SULFATE (2.5 MG/3ML) 0.083% IN NEBU: 2.5000 mg | INHALATION_SOLUTION | Freq: Four times a day (QID) | RESPIRATORY_TRACT | 12 refills | Status: AC | PRN

## 2018-04-03 MED ORDER — CYCLOBENZAPRINE HCL 5 MG PO TABS: 5.0000 mg | ORAL_TABLET | Freq: Three times a day (TID) | ORAL | 0 refills | Status: AC | PRN

## 2018-04-03 NOTE — Discharge Summary (Signed)
Physician Discharge Summary  Mikayla Marks YQI:[id number] DOB: 1928-02-16 DOA: 03/28/2018  PCP: Colon Branch, MD  Admit date: 03/28/2018 Discharge date: 04/03/2018  Time spent: 35 minutes  Recommendations for Outpatient Follow-up:  1. Palliative care follow up at SNF   Discharge Diagnoses:  Active Problems:   Pathologic vertebral fracture   Plasmacytoma (HCC)   Chronic kidney disease   Carotid artery stenosis   Failure to thrive in adult   Generalized weakness   Acute cystitis with hematuria   Malnutrition of moderate degree   Failure to thrive (0-17)   Multiple myeloma not having achieved remission ([place])   Encounter for palliative care   Goals of care, counseling/discussion   Discharge Condition: Stable  Diet recommendation: Regular diet  Filed Weights   03/29/18 1104  Weight: 53.4 kg    History of present illness:  84 year old female with a history of paranoid schizophrenia, hypertension, severe kyphosis, plasmacytoma was brought to the ED by daughter for poor appetite, reduced oral intake and progressive functional decline over the past few months.  In the ED CT of the cervical and thoracic spine showed multiple destructive expansile lytic lesion throughout the thoracic spine and new pathology and compression fracture of T11 and 12 with mild loss of height.   Hospital Course:   1. 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  At this time comfort measures have been recommended, pain control and palliative care has been consulted.  Decadron 2 mg IV every 12 hours ordered as adjuvant pain therapy, will change to Po Decadron 2 mg q 12 hr.  2. Pathological vertebral fracture-seen on CT thoracic spine she has pathological fracture involving T11 and T12 with mild loss of height.  Continue pain control.  3. UTI-patient started on empiric  Rocephin, urine culture from 10/2-19 showed insignificant growth.  Rocephin was discontinued  4. Chronic kidney disease stage III-creatinine at baseline.  5. Constipation-continue Senokot tablets, Colace 100 mg p.o. twice daily   6. Neck pain-patient has neck pain due to muscle spasm, improved, continue Flexeril 5 mg p.o. 3 times daily as needed   Procedures:  None   Consultations:  Palliative car-  as per Dr Rowe Pavy   "She states that she is very happy to see all of her family now at the bedside. Towards the end of this hospitalization, patient very clearly states that she does not want to go back home. She states she would like to go to a skilled nursing facility, wants to work on physical therapy, safe techniques. She does not want to be a burden on her daughter. She states she went to [facility] during a previous hospitalization. She is willing to talk with social work regarding skilled nursing facility options. I have recommended a rehabilitation attempt with palliative services following."   "Hospice philosophy of care was also introduced and discussed in detail. Towards the end of the rehabilitation trial, discussed about next steps, further options would include addition of hospice as an extra layer of support. Patient could either go to a hospice facility if she meets criteria at that time after rehabilitation or she could come home with hospice support."   Discharge Exam: Vitals:   04/03/18 0539 04/03/18 1416  BP: (!) 147/95 (!) 141/72  Pulse: 92 (!) 108  Resp: 16 15  Temp: 98 F (36.7 C) 98.1 F (36.7 C)  SpO2: 95% 92%  General: Appears in no acute distress Cardiovascular: S1S2 RRR Respiratory: Clear bilaterally  Discharge Instructions   Discharge Instructions    Diet - low sodium heart healthy   Complete by:  As directed    Increase activity slowly   Complete by:  As directed      Allergies as of 04/03/2018      Reactions   Other Other (See Comments)    5FU cream - stroke like symptoms   Bacitracin-polymyxin B Other (See Comments)   Reaction unknown.    Penicillins Rash      Medication List    STOP taking these medications   naproxen sodium 220 MG tablet Commonly known as:  ALEVE   SALONPAS EX   tiZANidine 4 MG tablet Commonly known as:  ZANAFLEX     TAKE these medications   acetaminophen 325 MG tablet Commonly known as:  TYLENOL Take 2 tablets (650 mg total) by mouth every 6 (six) hours as needed for mild pain (or Fever >/= 101).   albuterol (2.5 MG/3ML) 0.083% nebulizer solution Commonly known as:  PROVENTIL Take 3 mLs (2.5 mg total) by nebulization every 6 (six) hours as needed for wheezing.   collagenase ointment Commonly known as:  SANTYL Apply topically daily. Cleanse back wounds with NS .  Apply SAntyl to wound bed.  Cover with NS moist gauze.  Secure with silicone foam.  Change daily.   cyclobenzaprine 5 MG tablet Commonly known as:  FLEXERIL Take 1 tablet (5 mg total) by mouth 3 (three) times daily as needed for muscle spasms.   cycloSPORINE 0.05 % ophthalmic emulsion Commonly known as:  RESTASIS Place 1 drop into both eyes 2 (two) times daily.   dexamethasone 2 MG tablet Commonly known as:  DECADRON Take 1 tablet (2 mg total) by mouth 2 (two) times daily.   docusate sodium 100 MG capsule Commonly known as:  COLACE Take 1 capsule (100 mg total) by mouth 2 (two) times daily.   ENSURE PLUS Liqd Take 237 mLs by mouth 2 (two) times daily between meals.   oxyCODONE 5 MG immediate release tablet Commonly known as:  Oxy IR/ROXICODONE Take 1 tablet (5 mg total) by mouth 3 (three) times daily as needed for moderate pain.   senna 8.6 MG Tabs tablet Commonly known as:  SENOKOT Take 1 tablet (8.6 mg total) by mouth 2 (two) times daily. What changed:    when to take this  reasons to take this      Allergies  Allergen Reactions  . Other Other (See Comments)    5FU cream - stroke like symptoms  .  Bacitracin-Polymyxin B Other (See Comments)    Reaction unknown.   Marland Kitchen Penicillins Rash      The results of significant diagnostics from this hospitalization (including imaging, microbiology, ancillary and laboratory) are listed below for reference.    Significant Diagnostic Studies: Ct Cervical Spine Wo Contrast  Result Date: 03/28/2018 CLINICAL DATA:  Fall EXAM: CT CERVICAL SPINE WITHOUT CONTRAST TECHNIQUE: Multidetector CT imaging of the cervical spine was performed without intravenous contrast. Multiplanar CT image reconstructions were also generated. COMPARISON:  CT 04/02/2016 FINDINGS: Alignment: Study is limited by patient positioning. No subluxation. Facet alignment within normal limits. Skull base and vertebrae: No definitive fracture Soft tissues and spinal canal: No prevertebral fluid or swelling. No visible canal hematoma. Disc levels: Moderate diffuse degenerative change C4 through T1. Mild degenerative change at C3-C4. Upper chest: Lung apices are clear Other: Multiple bilateral upper rib lytic  lesions with associated soft tissue masses. Pathologic fracture right first rib. This is a known finding. IMPRESSION: 1. Limited study secondary to kyphotic patient and positioning. No gross fracture seen 2. Multiple lytic lesion within the visible bilateral ribs with associated soft tissue masses, which may reflect metastatic disease or history of myeloma. Chronic pathologic fracture of the right first rib. Electronically Signed   By: Donavan Foil M.D.   On: 03/28/2018 22:22   Ct Thoracic Spine Wo Contrast  Result Date: 03/28/2018 CLINICAL DATA:  Fall getting out of wheelchair. Patient reports chronic upper back pain, no new pain since fall. EXAM: CT THORACIC SPINE WITHOUT CONTRAST TECHNIQUE: Multidetector CT images of the thoracic were obtained using the standard protocol without intravenous contrast. COMPARISON:  Chest radiograph 05/17/2016.  Chest CT 04/02/2016 FINDINGS: Alignment: Severe  thoracic kyphosis centered at T8-T9 with 82 degrees kyphosis, distorting normal anatomy. Vertebrae: Vertebra plana appearance of T8 and T9 vertebral bodies at site of prior expansile lytic spine lesions, prior lesions are not well demonstrated. Multiple new lytic lesions throughout the thoracic and included lumbar spine. New dominant lesion involving T12 near completely replaces the vertebral body with cortical destruction anteriorly, mild loss of height consistent with pathologic fracture. Large T11 lytic lesions involves the left greater than right transverse processes, expansile with large soft tissue component, left greater than right. Soft tissue component invades the spinal canal. Lytic lesion involving L1 near completely replaces the vertebral body. L2 lesion involves the right transverse process with mass effect on the spinal canal. Additional lesion involving T7 vertebral body. Paraspinal and other soft tissues: There are innumerable lytic rib lesions with soft tissue components. Largest in dominant lesion involves the left fifth rib with large soft tissue component extending into the lung parenchyma. Additional lesions involving left first, second, and third ribs. Expansile lesion involving the right fifth rib at the costovertebral junction. Additional lesion involving anterior right third rib, posterior right first rib with pathologic fracture. Small left pleural effusion is partially included. Disc levels: Near complete disc space loss at T8 and T9 due to vertebral plana appearance of the vertebral bodies. Multilevel disc space narrowing and endplate spurring. IMPRESSION: 1. Innumerable destructive and expansile lytic lesions throughout the thoracic and included upper lumbar spine, new from 2017. There is varying degree of spinal canal involvement, which is most severe at T11 secondary to expansile transverse process lesions. Previous expansile lesions at T8 and T9 now have a vertebral plana appearance.  Additionally there are numerable rib lesions. Given history of plasmacytoma, findings likely represent multiple myeloma. Recommend correlation with prior workup. 2. New pathologic compression fractures of T11 and T12 with mild loss of height. 3. Severe thoracic kyphosis centered at T8-T9 at 82 degrees. 4. If greater detail is required to evaluate the canal, recommend thoracic spine MRI. Electronically Signed   By: Keith Rake M.D.   On: 03/28/2018 22:35    Microbiology: Recent Results (from the past 240 hour(s))  Urine culture     Status: Abnormal   Collection Time: 03/28/18  8:42 PM  Result Value Ref Range Status   Specimen Description   Final    URINE, CLEAN CATCH Performed at [facility], [address] [place], [place] [address]    Special Requests   Final    NONE Performed at [facility], [address] [place], [place] [address]    Culture (A)  Final    <10,000 COLONIES/mL INSIGNIFICANT GROWTH Performed at [facility], [place] [address] [place], [place] [address]    Report Status 03/30/2018 FINAL  Final     Labs: Basic Metabolic Panel: Recent Labs  Lab 03/28/18 2025 03/29/18 1045 03/30/18 0353  NA 136  --  141  K 4.7  --  4.3  CL 97*  --  105  CO2 27  --  27  GLUCOSE 115*  --  92  BUN 30*  --  28*  CREATININE 0.86 0.70 0.71  CALCIUM 11.4*  --  10.5*   Liver Function Tests: Recent Labs  Lab 03/28/18 2025  AST 19  ALT 13  ALKPHOS 62  BILITOT 1.2  PROT 7.5  ALBUMIN 3.5   No results for input(s): LIPASE, AMYLASE in the last 168 hours. No results for input(s): AMMONIA in the last 168 hours. CBC: Recent Labs  Lab 03/28/18 2025 03/29/18 1045 03/30/18 0353  WBC 11.7* 9.3 8.1  NEUTROABS 10.3*  --   --   HGB 12.5 11.6* 10.7*  HCT 37.0 34.8* 32.8*  MCV 95.6 97.8 98.5  PLT 259 276 247       Signed:  Oswald Hillock MD.  [facility] 04/03/2018, 2:19 PM

## 2018-04-03 NOTE — Clinical Social Work Placement (Signed)
Patient discharging to [facility].  CSW confirmed bed at facility and faxed approproiate documents. Patient's daughter, Mikayla Marks, informed of discharge and transportation. Patient will transport by [facility].  RN call report to: [place]  NOTE  Date:  04/03/2018  Patient Details  Name: Mikayla Marks MRN: [id number] Date of Birth: 1927/09/02  Clinical Social Work is seeking post-discharge placement for this patient at the [place] level of care (*CSW will initial, date and re-position this form in  chart as items are completed):  Yes   Patient/family provided with [facility] Work Department's list of facilities offering this level of care within the geographic area requested by the patient (or if unable, by the patient's family).  Yes   Patient/family informed of their freedom to choose among providers that offer the needed level of care, that participate in Medicare, Medicaid or managed care program needed by the patient, have an available bed and are willing to accept the patient.  Yes   Patient/family informed of [facility]'s ownership interest in [facility] and [facility], as well as of the fact that they are under no obligation to receive care at these facilities.  PASRR submitted to EDS on 04/01/18     PASRR number received on       Existing PASRR number confirmed on       FL2 transmitted to all facilities in geographic area requested by pt/family on       FL2 transmitted to all facilities within larger geographic area on       Patient informed that his/her managed care company has contracts with or will negotiate with certain facilities, including the following:        Yes   Patient/family informed of bed offers received.  Patient chooses bed at [facility]     Physician recommends and patient chooses bed at      Patient to be transferred to [facility] on 04/03/18.  Patient to be  transferred to facility by [facility]     Patient family notified on 04/03/18 of transfer.  Name of family member notified:  Mikayla Marks     PHYSICIAN Please sign DNR, Please prepare priority discharge summary, including medications     Additional Comment:    _______________________________________________ Pricilla Holm, LCSWA 04/03/2018, 2:31 PM

## 2018-04-03 NOTE — Progress Notes (Signed)
Report called to Amy at [facility] place. All questions and concerns addressed. Patient to be transported via [facility].

## 2018-04-04 DIAGNOSIS — C9000 Multiple myeloma not having achieved remission: Secondary | ICD-10-CM | POA: Diagnosis not present

## 2018-04-04 DIAGNOSIS — D649 Anemia, unspecified: Secondary | ICD-10-CM | POA: Diagnosis not present

## 2018-04-04 DIAGNOSIS — N189 Chronic kidney disease, unspecified: Secondary | ICD-10-CM | POA: Diagnosis not present

## 2018-04-04 DIAGNOSIS — M4854XA Collapsed vertebra, not elsewhere classified, thoracic region, initial encounter for fracture: Secondary | ICD-10-CM | POA: Diagnosis not present

## 2018-04-06 DIAGNOSIS — D649 Anemia, unspecified: Secondary | ICD-10-CM | POA: Diagnosis not present

## 2018-04-06 DIAGNOSIS — M4854XA Collapsed vertebra, not elsewhere classified, thoracic region, initial encounter for fracture: Secondary | ICD-10-CM | POA: Diagnosis not present

## 2018-04-06 DIAGNOSIS — C9000 Multiple myeloma not having achieved remission: Secondary | ICD-10-CM | POA: Diagnosis not present

## 2018-04-06 DIAGNOSIS — L89109 Pressure ulcer of unspecified part of back, unspecified stage: Secondary | ICD-10-CM | POA: Diagnosis not present

## 2018-04-10 DIAGNOSIS — R2689 Other abnormalities of gait and mobility: Secondary | ICD-10-CM | POA: Diagnosis not present

## 2018-04-10 DIAGNOSIS — M4854XA Collapsed vertebra, not elsewhere classified, thoracic region, initial encounter for fracture: Secondary | ICD-10-CM | POA: Diagnosis not present

## 2018-04-10 DIAGNOSIS — M545 Low back pain: Secondary | ICD-10-CM | POA: Diagnosis not present

## 2018-04-10 DIAGNOSIS — R000 Tachycardia, unspecified: Secondary | ICD-10-CM | POA: Diagnosis not present

## 2018-04-11 DIAGNOSIS — L89124 Pressure ulcer of left upper back, stage 4: Secondary | ICD-10-CM | POA: Diagnosis not present

## 2018-04-11 DIAGNOSIS — M4854XA Collapsed vertebra, not elsewhere classified, thoracic region, initial encounter for fracture: Secondary | ICD-10-CM | POA: Diagnosis not present

## 2018-04-11 DIAGNOSIS — R000 Tachycardia, unspecified: Secondary | ICD-10-CM | POA: Diagnosis not present

## 2018-04-11 DIAGNOSIS — F419 Anxiety disorder, unspecified: Secondary | ICD-10-CM | POA: Diagnosis not present

## 2018-04-11 DIAGNOSIS — L259 Unspecified contact dermatitis, unspecified cause: Secondary | ICD-10-CM | POA: Diagnosis not present

## 2018-04-11 DIAGNOSIS — L89113 Pressure ulcer of right upper back, stage 3: Secondary | ICD-10-CM | POA: Diagnosis not present

## 2018-04-13 DIAGNOSIS — R000 Tachycardia, unspecified: Secondary | ICD-10-CM | POA: Diagnosis not present

## 2018-04-13 DIAGNOSIS — N189 Chronic kidney disease, unspecified: Secondary | ICD-10-CM | POA: Diagnosis not present

## 2018-04-13 DIAGNOSIS — D649 Anemia, unspecified: Secondary | ICD-10-CM | POA: Diagnosis not present

## 2018-04-16 ENCOUNTER — Ambulatory Visit: Payer: Self-pay | Admitting: Internal Medicine

## 2018-04-16 DIAGNOSIS — M4854XA Collapsed vertebra, not elsewhere classified, thoracic region, initial encounter for fracture: Secondary | ICD-10-CM | POA: Diagnosis not present

## 2018-04-16 DIAGNOSIS — M545 Low back pain: Secondary | ICD-10-CM | POA: Diagnosis not present

## 2018-04-16 DIAGNOSIS — R000 Tachycardia, unspecified: Secondary | ICD-10-CM | POA: Diagnosis not present

## 2018-04-16 DIAGNOSIS — R2689 Other abnormalities of gait and mobility: Secondary | ICD-10-CM | POA: Diagnosis not present

## 2018-04-17 ENCOUNTER — Telehealth: Payer: Self-pay | Admitting: Internal Medicine
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
## 2018-04-18 DIAGNOSIS — M545 Low back pain: Secondary | ICD-10-CM | POA: Diagnosis not present

## 2018-04-18 DIAGNOSIS — R2689 Other abnormalities of gait and mobility: Secondary | ICD-10-CM | POA: Diagnosis not present

## 2018-04-19 NOTE — Telephone Encounter (Signed)
Chief Strategy Officer phoned daughter per Dr. Ethel Rana request to see how pt. was doing. Daughter stated she was aware that pt. had missed her appointment, but when asked if she knew why, she implored [place] to call the nursing home. Daughter would not give [place] any information but kept asking for Dr. Larose Kells to call the nursing home. Contact # given: [phone]. "Let me know what they say, I'll be waiting for the call". Routed to Dr. Larose Kells to advise.
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
## 2018-04-19 NOTE — Telephone Encounter (Signed)
Author phoned Mikayla Marks, daughter, to relay Dr. Ethel Rana message, but no answer, went to automated VM. Will re-attempt to reach daughter at a later time.

## 2018-04-20 DIAGNOSIS — M545 Low back pain: Secondary | ICD-10-CM | POA: Diagnosis not present

## 2018-04-20 DIAGNOSIS — R2689 Other abnormalities of gait and mobility: Secondary | ICD-10-CM | POA: Diagnosis not present

## 2018-04-24 DIAGNOSIS — M4854XA Collapsed vertebra, not elsewhere classified, thoracic region, initial encounter for fracture: Secondary | ICD-10-CM | POA: Diagnosis not present

## 2018-04-26 ENCOUNTER — Encounter: Payer: Self-pay | Admitting: Internal Medicine

## 2018-04-30 DIAGNOSIS — R2689 Other abnormalities of gait and mobility: Secondary | ICD-10-CM | POA: Diagnosis not present

## 2018-04-30 DIAGNOSIS — M545 Low back pain: Secondary | ICD-10-CM | POA: Diagnosis not present

## 2018-05-02 DIAGNOSIS — F329 Major depressive disorder, single episode, unspecified: Secondary | ICD-10-CM | POA: Diagnosis not present

## 2018-05-02 DIAGNOSIS — F200 Paranoid schizophrenia: Secondary | ICD-10-CM | POA: Diagnosis not present

## 2018-05-03 DIAGNOSIS — C9000 Multiple myeloma not having achieved remission: Secondary | ICD-10-CM | POA: Diagnosis not present

## 2018-05-03 DIAGNOSIS — D649 Anemia, unspecified: Secondary | ICD-10-CM | POA: Diagnosis not present

## 2018-05-03 DIAGNOSIS — N189 Chronic kidney disease, unspecified: Secondary | ICD-10-CM | POA: Diagnosis not present

## 2018-05-03 DIAGNOSIS — L89109 Pressure ulcer of unspecified part of back, unspecified stage: Secondary | ICD-10-CM | POA: Diagnosis not present

## 2018-05-07 DIAGNOSIS — L89104 Pressure ulcer of unspecified part of back, stage 4: Secondary | ICD-10-CM | POA: Diagnosis not present

## 2018-05-07 DIAGNOSIS — D649 Anemia, unspecified: Secondary | ICD-10-CM | POA: Diagnosis not present

## 2018-05-07 DIAGNOSIS — R531 Weakness: Secondary | ICD-10-CM | POA: Diagnosis not present

## 2018-05-07 DIAGNOSIS — M4854XA Collapsed vertebra, not elsewhere classified, thoracic region, initial encounter for fracture: Secondary | ICD-10-CM | POA: Diagnosis not present

## 2018-05-15 DIAGNOSIS — A4901 Methicillin susceptible Staphylococcus aureus infection, unspecified site: Secondary | ICD-10-CM | POA: Diagnosis not present

## 2018-05-16 DIAGNOSIS — F200 Paranoid schizophrenia: Secondary | ICD-10-CM | POA: Diagnosis not present

## 2018-05-16 DIAGNOSIS — L89124 Pressure ulcer of left upper back, stage 4: Secondary | ICD-10-CM | POA: Diagnosis not present

## 2018-05-16 DIAGNOSIS — F329 Major depressive disorder, single episode, unspecified: Secondary | ICD-10-CM | POA: Diagnosis not present

## 2018-05-16 DIAGNOSIS — L89113 Pressure ulcer of right upper back, stage 3: Secondary | ICD-10-CM | POA: Diagnosis not present

## 2018-05-21 DIAGNOSIS — M4854XA Collapsed vertebra, not elsewhere classified, thoracic region, initial encounter for fracture: Secondary | ICD-10-CM | POA: Diagnosis not present

## 2018-05-21 DIAGNOSIS — N189 Chronic kidney disease, unspecified: Secondary | ICD-10-CM | POA: Diagnosis not present

## 2018-05-21 DIAGNOSIS — C9000 Multiple myeloma not having achieved remission: Secondary | ICD-10-CM | POA: Diagnosis not present

## 2018-05-21 DIAGNOSIS — L89109 Pressure ulcer of unspecified part of back, unspecified stage: Secondary | ICD-10-CM | POA: Diagnosis not present

## 2018-05-22 DIAGNOSIS — L89114 Pressure ulcer of right upper back, stage 4: Secondary | ICD-10-CM | POA: Diagnosis not present

## 2018-05-22 DIAGNOSIS — R531 Weakness: Secondary | ICD-10-CM | POA: Diagnosis not present

## 2018-05-22 DIAGNOSIS — N189 Chronic kidney disease, unspecified: Secondary | ICD-10-CM | POA: Diagnosis not present

## 2018-05-22 DIAGNOSIS — L89124 Pressure ulcer of left upper back, stage 4: Secondary | ICD-10-CM | POA: Diagnosis not present

## 2018-05-22 DIAGNOSIS — C9000 Multiple myeloma not having achieved remission: Secondary | ICD-10-CM | POA: Diagnosis not present

## 2018-05-22 DIAGNOSIS — D649 Anemia, unspecified: Secondary | ICD-10-CM | POA: Diagnosis not present

## 2018-05-25 DIAGNOSIS — R1312 Dysphagia, oropharyngeal phase: Secondary | ICD-10-CM | POA: Diagnosis not present

## 2018-05-25 DIAGNOSIS — S22080D Wedge compression fracture of T11-T12 vertebra, subsequent encounter for fracture with routine healing: Secondary | ICD-10-CM | POA: Diagnosis not present

## 2018-05-25 DIAGNOSIS — M6281 Muscle weakness (generalized): Secondary | ICD-10-CM | POA: Diagnosis not present

## 2018-05-25 DIAGNOSIS — R498 Other voice and resonance disorders: Secondary | ICD-10-CM | POA: Diagnosis not present

## 2018-05-25 DIAGNOSIS — R2681 Unsteadiness on feet: Secondary | ICD-10-CM | POA: Diagnosis not present

## 2018-05-28 DIAGNOSIS — R531 Weakness: Secondary | ICD-10-CM | POA: Diagnosis not present

## 2018-05-28 DIAGNOSIS — L89104 Pressure ulcer of unspecified part of back, stage 4: Secondary | ICD-10-CM | POA: Diagnosis not present

## 2018-05-28 DIAGNOSIS — M4854XA Collapsed vertebra, not elsewhere classified, thoracic region, initial encounter for fracture: Secondary | ICD-10-CM | POA: Diagnosis not present

## 2018-05-28 DIAGNOSIS — C9000 Multiple myeloma not having achieved remission: Secondary | ICD-10-CM | POA: Diagnosis not present

## 2018-05-30 DIAGNOSIS — M1990 Unspecified osteoarthritis, unspecified site: Secondary | ICD-10-CM | POA: Diagnosis not present

## 2018-05-30 DIAGNOSIS — E440 Moderate protein-calorie malnutrition: Secondary | ICD-10-CM | POA: Diagnosis not present

## 2018-05-30 DIAGNOSIS — C9000 Multiple myeloma not having achieved remission: Secondary | ICD-10-CM | POA: Diagnosis not present

## 2018-05-30 DIAGNOSIS — R1312 Dysphagia, oropharyngeal phase: Secondary | ICD-10-CM | POA: Diagnosis not present

## 2018-05-30 DIAGNOSIS — I129 Hypertensive chronic kidney disease with stage 1 through stage 4 chronic kidney disease, or unspecified chronic kidney disease: Secondary | ICD-10-CM | POA: Diagnosis not present

## 2018-05-30 DIAGNOSIS — N183 Chronic kidney disease, stage 3 (moderate): Secondary | ICD-10-CM | POA: Diagnosis not present

## 2018-05-30 DIAGNOSIS — K759 Inflammatory liver disease, unspecified: Secondary | ICD-10-CM | POA: Diagnosis not present

## 2018-05-30 DIAGNOSIS — R64 Cachexia: Secondary | ICD-10-CM | POA: Diagnosis not present

## 2018-05-30 DIAGNOSIS — M8448XD Pathological fracture, other site, subsequent encounter for fracture with routine healing: Secondary | ICD-10-CM | POA: Diagnosis not present

## 2018-05-30 DIAGNOSIS — M40209 Unspecified kyphosis, site unspecified: Secondary | ICD-10-CM | POA: Diagnosis not present

## 2018-05-30 DIAGNOSIS — F200 Paranoid schizophrenia: Secondary | ICD-10-CM | POA: Diagnosis not present

## 2018-06-01 DIAGNOSIS — R64 Cachexia: Secondary | ICD-10-CM | POA: Diagnosis not present

## 2018-06-01 DIAGNOSIS — C9000 Multiple myeloma not having achieved remission: Secondary | ICD-10-CM | POA: Diagnosis not present

## 2018-06-01 DIAGNOSIS — N183 Chronic kidney disease, stage 3 (moderate): Secondary | ICD-10-CM | POA: Diagnosis not present

## 2018-06-01 DIAGNOSIS — I129 Hypertensive chronic kidney disease with stage 1 through stage 4 chronic kidney disease, or unspecified chronic kidney disease: Secondary | ICD-10-CM | POA: Diagnosis not present

## 2018-06-01 DIAGNOSIS — E440 Moderate protein-calorie malnutrition: Secondary | ICD-10-CM | POA: Diagnosis not present

## 2018-06-01 DIAGNOSIS — R1312 Dysphagia, oropharyngeal phase: Secondary | ICD-10-CM | POA: Diagnosis not present

## 2018-06-04 DIAGNOSIS — R000 Tachycardia, unspecified: Secondary | ICD-10-CM | POA: Diagnosis not present

## 2018-06-04 DIAGNOSIS — R0682 Tachypnea, not elsewhere classified: Secondary | ICD-10-CM | POA: Diagnosis not present

## 2018-06-04 DIAGNOSIS — E440 Moderate protein-calorie malnutrition: Secondary | ICD-10-CM | POA: Diagnosis not present

## 2018-06-04 DIAGNOSIS — R64 Cachexia: Secondary | ICD-10-CM | POA: Diagnosis not present

## 2018-06-04 DIAGNOSIS — I129 Hypertensive chronic kidney disease with stage 1 through stage 4 chronic kidney disease, or unspecified chronic kidney disease: Secondary | ICD-10-CM | POA: Diagnosis not present

## 2018-06-04 DIAGNOSIS — C9000 Multiple myeloma not having achieved remission: Secondary | ICD-10-CM | POA: Diagnosis not present

## 2018-06-04 DIAGNOSIS — R1312 Dysphagia, oropharyngeal phase: Secondary | ICD-10-CM | POA: Diagnosis not present

## 2018-06-04 DIAGNOSIS — N183 Chronic kidney disease, stage 3 (moderate): Secondary | ICD-10-CM | POA: Diagnosis not present

## 2018-06-27 DEATH — deceased

## 2018-08-20 ENCOUNTER — Telehealth: Payer: Self-pay

## 2018-08-20 NOTE — Telephone Encounter (Signed)
I saw Mikayla Marks today, condolences provided.

## 2018-08-20 NOTE — Telephone Encounter (Signed)
Pt's brother, [name] informed that Pt passed away 06/25/18.
